# Patient Record
Sex: Female | Born: 1945
Health system: Southern US, Community
[De-identification: ages and names within clinical notes are randomized; demographics above are authoritative.]

## PROBLEM LIST (undated history)

## (undated) DIAGNOSIS — E559 Vitamin D deficiency, unspecified: Secondary | ICD-10-CM

## (undated) DIAGNOSIS — Z8042 Family history of malignant neoplasm of prostate: Secondary | ICD-10-CM

## (undated) DIAGNOSIS — K5790 Diverticulosis of intestine, part unspecified, without perforation or abscess without bleeding: Secondary | ICD-10-CM

## (undated) DIAGNOSIS — R937 Abnormal findings on diagnostic imaging of other parts of musculoskeletal system: Secondary | ICD-10-CM

## (undated) DIAGNOSIS — E119 Type 2 diabetes mellitus without complications: Secondary | ICD-10-CM

## (undated) DIAGNOSIS — Z923 Personal history of irradiation: Secondary | ICD-10-CM

## (undated) DIAGNOSIS — R011 Cardiac murmur, unspecified: Secondary | ICD-10-CM

## (undated) DIAGNOSIS — Z91199 Patient's noncompliance with other medical treatment and regimen due to unspecified reason: Secondary | ICD-10-CM

## (undated) DIAGNOSIS — T4145XA Adverse effect of unspecified anesthetic, initial encounter: Secondary | ICD-10-CM

## (undated) DIAGNOSIS — M1990 Unspecified osteoarthritis, unspecified site: Secondary | ICD-10-CM

## (undated) DIAGNOSIS — Z800 Family history of malignant neoplasm of digestive organs: Secondary | ICD-10-CM

## (undated) DIAGNOSIS — T8859XA Other complications of anesthesia, initial encounter: Secondary | ICD-10-CM

## (undated) DIAGNOSIS — I5030 Unspecified diastolic (congestive) heart failure: Secondary | ICD-10-CM

## (undated) DIAGNOSIS — E785 Hyperlipidemia, unspecified: Secondary | ICD-10-CM

## (undated) DIAGNOSIS — K219 Gastro-esophageal reflux disease without esophagitis: Secondary | ICD-10-CM

## (undated) DIAGNOSIS — C801 Malignant (primary) neoplasm, unspecified: Secondary | ICD-10-CM

## (undated) DIAGNOSIS — Z8041 Family history of malignant neoplasm of ovary: Secondary | ICD-10-CM

## (undated) DIAGNOSIS — M810 Age-related osteoporosis without current pathological fracture: Secondary | ICD-10-CM

## (undated) DIAGNOSIS — Z9119 Patient's noncompliance with other medical treatment and regimen: Secondary | ICD-10-CM

## (undated) DIAGNOSIS — I10 Essential (primary) hypertension: Secondary | ICD-10-CM

## (undated) HISTORY — DX: Patient's noncompliance with other medical treatment and regimen: Z91.19

## (undated) HISTORY — DX: Vitamin D deficiency, unspecified: E55.9

## (undated) HISTORY — DX: Essential (primary) hypertension: I10

## (undated) HISTORY — DX: Family history of malignant neoplasm of digestive organs: Z80.0

## (undated) HISTORY — DX: Diverticulosis of intestine, part unspecified, without perforation or abscess without bleeding: K57.90

## (undated) HISTORY — DX: Type 2 diabetes mellitus without complications: E11.9

## (undated) HISTORY — DX: Family history of malignant neoplasm of prostate: Z80.42

## (undated) HISTORY — PX: FOREIGN BODY REMOVAL ESOPHAGEAL: SHX5322

## (undated) HISTORY — DX: Abnormal findings on diagnostic imaging of other parts of musculoskeletal system: R93.7

## (undated) HISTORY — PX: DILATION AND CURETTAGE OF UTERUS: SHX78

## (undated) HISTORY — PX: APPENDECTOMY: SHX54

## (undated) HISTORY — DX: Patient's noncompliance with other medical treatment and regimen due to unspecified reason: Z91.199

## (undated) HISTORY — DX: Hyperlipidemia, unspecified: E78.5

## (undated) HISTORY — DX: Family history of malignant neoplasm of ovary: Z80.41

## (undated) HISTORY — DX: Gastro-esophageal reflux disease without esophagitis: K21.9

## (undated) HISTORY — PX: TUBAL LIGATION: SHX77

## (undated) HISTORY — DX: Malignant (primary) neoplasm, unspecified: C80.1

## (undated) HISTORY — PX: TONSILLECTOMY: SUR1361

## (undated) HISTORY — DX: Age-related osteoporosis without current pathological fracture: M81.0

---

## 2004-03-01 ENCOUNTER — Emergency Department (HOSPITAL_COMMUNITY): Admission: EM | Admit: 2004-03-01 | Discharge: 2004-03-01 | Payer: Self-pay | Admitting: Emergency Medicine

## 2005-06-12 HISTORY — PX: COLONOSCOPY: SHX174

## 2005-06-12 LAB — HM COLONOSCOPY

## 2006-10-15 ENCOUNTER — Ambulatory Visit: Payer: Self-pay | Admitting: Family Medicine

## 2006-10-16 LAB — CONVERTED CEMR LAB
ALT: 28 units/L (ref 0–35)
AST: 22 units/L (ref 0–37)
Albumin: 4.1 g/dL (ref 3.5–5.2)
Alkaline Phosphatase: 79 units/L (ref 39–117)
BUN: 13 mg/dL (ref 6–23)
Basophils Absolute: 0 10*3/uL (ref 0.0–0.1)
Basophils Relative: 0 % (ref 0.0–1.0)
Bilirubin, Direct: 0.1 mg/dL (ref 0.0–0.3)
CO2: 30 meq/L (ref 19–32)
Calcium: 9.6 mg/dL (ref 8.4–10.5)
Chloride: 107 meq/L (ref 96–112)
Cholesterol: 251 mg/dL (ref 0–200)
Creatinine, Ser: 0.8 mg/dL (ref 0.4–1.2)
Direct LDL: 172.1 mg/dL
Eosinophils Absolute: 0.4 10*3/uL (ref 0.0–0.6)
Eosinophils Relative: 3.4 % (ref 0.0–5.0)
GFR calc Af Amer: 94 mL/min
GFR calc non Af Amer: 78 mL/min
Glucose, Bld: 94 mg/dL (ref 70–99)
HCT: 41.1 % (ref 36.0–46.0)
HDL: 40.3 mg/dL (ref >39.0)
Hemoglobin: 14.1 g/dL (ref 12.0–15.0)
Lymphocytes Relative: 20.2 % (ref 12.0–46.0)
MCHC: 34.2 g/dL (ref 30.0–36.0)
MCV: 86.3 fL (ref 78.0–100.0)
Monocytes Absolute: 0.7 10*3/uL (ref 0.2–0.7)
Monocytes Relative: 6.5 % (ref 3.0–11.0)
Neutro Abs: 7.6 10*3/uL (ref 1.4–7.7)
Neutrophils Relative %: 69.9 % (ref 43.0–77.0)
Platelets: 337 10*3/uL (ref 150–400)
Potassium: 4.3 meq/L (ref 3.5–5.1)
RBC: 4.77 M/uL (ref 3.87–5.11)
RDW: 13.6 % (ref 11.5–14.6)
Sodium: 142 meq/L (ref 135–145)
TSH: 1.6 microintl units/mL (ref 0.35–5.50)
Total Bilirubin: 1 mg/dL (ref 0.3–1.2)
Total CHOL/HDL Ratio: 6.2
Total Protein: 7.1 g/dL (ref 6.0–8.3)
Triglycerides: 210 mg/dL (ref 0–149)
VLDL: 42 mg/dL — ABNORMAL HIGH (ref 0–40)
WBC: 10.9 10*3/uL — ABNORMAL HIGH (ref 4.5–10.5)

## 2006-10-26 ENCOUNTER — Encounter: Payer: Self-pay | Admitting: Family Medicine

## 2006-10-26 DIAGNOSIS — K5730 Diverticulosis of large intestine without perforation or abscess without bleeding: Secondary | ICD-10-CM

## 2006-10-26 DIAGNOSIS — R011 Cardiac murmur, unspecified: Secondary | ICD-10-CM

## 2006-10-26 DIAGNOSIS — E785 Hyperlipidemia, unspecified: Secondary | ICD-10-CM | POA: Insufficient documentation

## 2006-10-26 DIAGNOSIS — Z9189 Other specified personal risk factors, not elsewhere classified: Secondary | ICD-10-CM | POA: Insufficient documentation

## 2006-10-26 DIAGNOSIS — I10 Essential (primary) hypertension: Secondary | ICD-10-CM

## 2006-10-26 DIAGNOSIS — E1159 Type 2 diabetes mellitus with other circulatory complications: Secondary | ICD-10-CM | POA: Insufficient documentation

## 2006-11-20 ENCOUNTER — Encounter: Payer: Self-pay | Admitting: Family Medicine

## 2007-11-13 ENCOUNTER — Ambulatory Visit: Payer: Self-pay | Admitting: Internal Medicine

## 2007-11-13 ENCOUNTER — Inpatient Hospital Stay (HOSPITAL_COMMUNITY): Admission: EM | Admit: 2007-11-13 | Discharge: 2007-11-14 | Payer: Self-pay | Admitting: Emergency Medicine

## 2007-11-19 ENCOUNTER — Encounter: Payer: Self-pay | Admitting: Family Medicine

## 2007-11-19 ENCOUNTER — Ambulatory Visit: Payer: Self-pay

## 2007-11-20 ENCOUNTER — Ambulatory Visit: Payer: Self-pay | Admitting: Family Medicine

## 2007-11-20 DIAGNOSIS — IMO0001 Reserved for inherently not codable concepts without codable children: Secondary | ICD-10-CM | POA: Insufficient documentation

## 2007-11-20 DIAGNOSIS — R0789 Other chest pain: Secondary | ICD-10-CM | POA: Insufficient documentation

## 2007-11-20 HISTORY — DX: Other chest pain: R07.89

## 2007-11-21 ENCOUNTER — Telehealth (INDEPENDENT_AMBULATORY_CARE_PROVIDER_SITE_OTHER): Payer: Self-pay | Admitting: *Deleted

## 2007-11-22 ENCOUNTER — Encounter: Admission: RE | Admit: 2007-11-22 | Discharge: 2007-11-22 | Payer: Self-pay | Admitting: Family Medicine

## 2007-11-28 ENCOUNTER — Encounter: Payer: Self-pay | Admitting: Family Medicine

## 2007-11-28 ENCOUNTER — Ambulatory Visit: Payer: Self-pay

## 2008-08-04 ENCOUNTER — Telehealth: Payer: Self-pay | Admitting: Family Medicine

## 2008-08-11 ENCOUNTER — Ambulatory Visit: Payer: Self-pay | Admitting: Family Medicine

## 2008-08-11 DIAGNOSIS — K219 Gastro-esophageal reflux disease without esophagitis: Secondary | ICD-10-CM

## 2008-08-11 HISTORY — DX: Gastro-esophageal reflux disease without esophagitis: K21.9

## 2008-08-13 LAB — CONVERTED CEMR LAB
ALT: 40 units/L — ABNORMAL HIGH (ref 0–35)
AST: 43 units/L — ABNORMAL HIGH (ref 0–37)
Albumin: 4.2 g/dL (ref 3.5–5.2)
Alkaline Phosphatase: 111 units/L (ref 39–117)
BUN: 17 mg/dL (ref 6–23)
Basophils Absolute: 0.1 10*3/uL (ref 0.0–0.1)
Basophils Relative: 0.6 % (ref 0.0–3.0)
Bilirubin, Direct: 0.1 mg/dL (ref 0.0–0.3)
CO2: 26 meq/L (ref 19–32)
Calcium: 9.2 mg/dL (ref 8.4–10.5)
Chloride: 108 meq/L (ref 96–112)
Cholesterol: 143 mg/dL (ref 0–200)
Creatinine, Ser: 0.7 mg/dL (ref 0.4–1.2)
Eosinophils Absolute: 0.4 10*3/uL (ref 0.0–0.7)
Eosinophils Relative: 3.5 % (ref 0.0–5.0)
GFR calc non Af Amer: 90.47 mL/min (ref >60)
Glucose, Bld: 159 mg/dL — ABNORMAL HIGH (ref 70–99)
HCT: 43.3 % (ref 36.0–46.0)
HDL: 35.9 mg/dL — ABNORMAL LOW (ref >39.00)
Hemoglobin: 14.8 g/dL (ref 12.0–15.0)
LDL Cholesterol: 75 mg/dL (ref 0–99)
Lymphocytes Relative: 21.4 % (ref 12.0–46.0)
Lymphs Abs: 2.2 10*3/uL (ref 0.7–4.0)
MCHC: 34.1 g/dL (ref 30.0–36.0)
MCV: 85.2 fL (ref 78.0–100.0)
Monocytes Absolute: 0.8 10*3/uL (ref 0.1–1.0)
Monocytes Relative: 7.5 % (ref 3.0–12.0)
Neutro Abs: 6.8 10*3/uL (ref 1.4–7.7)
Neutrophils Relative %: 67 % (ref 43.0–77.0)
Platelets: 267 10*3/uL (ref 150.0–400.0)
Potassium: 4.1 meq/L (ref 3.5–5.1)
RBC: 5.08 M/uL (ref 3.87–5.11)
RDW: 13.6 % (ref 11.5–14.6)
Sodium: 141 meq/L (ref 135–145)
TSH: 1.14 microintl units/mL (ref 0.35–5.50)
Total Bilirubin: 1 mg/dL (ref 0.3–1.2)
Total CHOL/HDL Ratio: 4
Total Protein: 7.4 g/dL (ref 6.0–8.3)
Triglycerides: 163 mg/dL — ABNORMAL HIGH (ref 0.0–149.0)
VLDL: 32.6 mg/dL (ref 0.0–40.0)
Vit D, 25-Hydroxy: 23 ng/mL — ABNORMAL LOW (ref 30–89)
WBC: 10.3 10*3/uL (ref 4.5–10.5)

## 2008-08-21 LAB — CONVERTED CEMR LAB: Hgb A1c MFr Bld: 10.6 % — ABNORMAL HIGH (ref 4.6–6.5)

## 2008-08-24 ENCOUNTER — Telehealth: Payer: Self-pay | Admitting: *Deleted

## 2008-10-06 ENCOUNTER — Ambulatory Visit: Payer: Self-pay | Admitting: Family Medicine

## 2008-10-06 DIAGNOSIS — E119 Type 2 diabetes mellitus without complications: Secondary | ICD-10-CM

## 2008-10-06 DIAGNOSIS — E1165 Type 2 diabetes mellitus with hyperglycemia: Secondary | ICD-10-CM | POA: Insufficient documentation

## 2008-10-13 ENCOUNTER — Encounter: Admission: RE | Admit: 2008-10-13 | Discharge: 2009-01-11 | Payer: Self-pay | Admitting: Family Medicine

## 2008-10-15 ENCOUNTER — Encounter: Payer: Self-pay | Admitting: Family Medicine

## 2008-10-15 LAB — CONVERTED CEMR LAB: Hgb A1c MFr Bld: 8 % — ABNORMAL HIGH (ref 4.6–6.5)

## 2008-11-04 ENCOUNTER — Encounter: Payer: Self-pay | Admitting: Family Medicine

## 2008-11-18 ENCOUNTER — Ambulatory Visit: Payer: Self-pay | Admitting: Family Medicine

## 2008-11-18 DIAGNOSIS — F329 Major depressive disorder, single episode, unspecified: Secondary | ICD-10-CM

## 2008-11-18 DIAGNOSIS — K5732 Diverticulitis of large intestine without perforation or abscess without bleeding: Secondary | ICD-10-CM

## 2008-12-08 ENCOUNTER — Ambulatory Visit: Payer: Self-pay | Admitting: Family Medicine

## 2009-01-23 ENCOUNTER — Encounter: Payer: Self-pay | Admitting: Family Medicine

## 2009-02-19 ENCOUNTER — Telehealth: Payer: Self-pay | Admitting: Family Medicine

## 2010-05-22 ENCOUNTER — Other Ambulatory Visit: Payer: Self-pay | Admitting: Family Medicine

## 2010-05-23 NOTE — Telephone Encounter (Signed)
meds denied and request faxed

## 2010-05-23 NOTE — Telephone Encounter (Signed)
Looks like ov last 2010

## 2010-05-23 NOTE — Telephone Encounter (Signed)
Last ov 2010

## 2010-05-23 NOTE — Telephone Encounter (Signed)
No refills until we see her again

## 2010-06-02 ENCOUNTER — Other Ambulatory Visit: Payer: Self-pay | Admitting: Family Medicine

## 2010-06-02 NOTE — Telephone Encounter (Signed)
Dr Clent Ridges looks like last ov 2010

## 2010-06-07 ENCOUNTER — Other Ambulatory Visit: Payer: Self-pay | Admitting: *Deleted

## 2010-06-28 ENCOUNTER — Other Ambulatory Visit: Payer: Self-pay | Admitting: Family Medicine

## 2010-08-23 NOTE — H&P (Signed)
NAMEANGELISA, [name]                ACCOUNT NO.:  [id number]   MEDICAL RECORD NO.:  [id number]          PATIENT TYPE:  INP   LOCATION:  2004                         FACILITY:  [facility]   PHYSICIAN:  Michiel Cowboy, MDDATE OF BIRTH:  Jul 20, 1945   DATE OF ADMISSION:  11/14/2007  DATE OF DISCHARGE:                              HISTORY & PHYSICAL   PRIMARY CARE [place]:  Dr. Clent Ridges.   The patient is a 65 year old female with a history of hyperlipidemia and  hypertension.  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  She got clammy and  nauseous.  This lasted for about 7 hours, but by the end was coming and  going rather than continuous.  The patient did not come to the emergency  department or seek any medical help for this and it went away.  But  today, it reoccurred again and lasted for about 4 hours prior to going  away spontaneously.  The patient did present to the emergency department  for further evaluation for this particular symptom.  She is not sure  what brought this on.  During this, she had mild shortness of breath,  but the chest pain was nonpositional, nonradiating and currently  resolved.  The patient never had any cardiac workup in the past.  She  does not smoke or drink.  She does not have an early family history of  heart disease.   SOCIAL HISTORY:  The patient denies alcohol or drug use.  Does not  smoke, never did.   FAMILY HISTORY:  Significant for grandmother with heart disease in her  33s and mother with cardiomyopathy, but no heart attack, no coronary  artery disease.   REVIEW OF SYSTEMS:  Otherwise unremarkable, except for as described in  HPI.  No fevers, no chills.   PAST MEDICAL HISTORY:  1. Hypertension.  2. Hyperlipidemia.  3. She was also  told she was borderline diabetic.  4. She had a colonoscopy in March 2007, which showed some      diverticulosis.   ALLERGIES:  TETRACYCLINE.   MEDICATIONS:  1. Crestor 20 mg p.o. daily.  2. Aspirin 81 mg p.o. daily.  3. Micardis 80 mg p.o. daily.  4. Fish oil tablets.   PHYSICAL EXAMINATION:  VITAL SIGNS:  Temperature 97.9, heart rate 71,  respirations 18, blood pressure 163/92.  Satting 96% on room air.  GENERAL:  The patient appears to be in no acute distress.  A obese  female lying down in the bed.  HEENT:  Head nontraumatic.  Moist mucous membranes.  LUNGS:  Occasional crackles here and there, but overall no wheezes, no  rhonchi.  HEART:  Regular rate and rhythm.  No murmurs could be appreciated.  LOWER EXTREMITIES:  Without edema, but obese.  ABDOMEN:  Obese but nontender, nondistended.  NEUROLOGIC:  Intact.  Moving all four extremities.  LABORATORY DATA:  White blood cell count 11, hemoglobin 14.3, platelets  266, sodium 138, potassium 3.7, creatinine 0.9.  LFTs within normal  limits.  Cardiac enzymes negative x2.  Chest x-ray is showing negative  for PE, but there is possibly reactive mediastinal node.  No evidence of  pneumonia.  EKG showing normal sinus rhythm.   ASSESSMENT/PLAN:  This is a 65 year old female with history of  hypertension, hyperlipidemia and possible early diabetes who presents  with somewhat atypical chest pain, but still has extensive risk factors.   1. Chest pain.  Given risk factors, will cycle cardiac enzymes x3.      Obtain serial ECGs.  Further risk-stratify with a fasting lipid      panel, hemoglobin A1c.  Will check TSH.  Will give Protonix in case      this is gastrointestinal related.  The patient probably will need      to have a stress test arranged.  Would recommend getting hold of      cardiology to have this arranged as an outpatient.  2. Hypertension.  Continue Micardis.  After initiation of her home      medications if the blood  pressure still stays highly elevated,      would add a different agent.  3. Hyperlipidemia.  Continue Crestor.  4. Elevated white blood cell count.  Check urinalysis plus urine      culture.  The patient could be hemoconcentrated, will check      orthostatics and give IV fluids.  5. Prophylaxis, Protonix with sequential compression devices.      Michiel Cowboy, MD  Electronically Signed     AVD/MEDQ  D:  11/14/2007  T:  11/14/2007  Job:  [id number]   cc:   Tera Mater. Clent Ridges, MD

## 2010-08-23 NOTE — Assessment & Plan Note (Signed)
[facility] OFFICE NOTE   NAME:[name], [name]                     MRN:          [id number]  DATE:10/15/2006                            DOB:          [birth year]    HISTORY:  This is a 65 year old woman here to establish with our  practice.  She had been seeing Dr. Catha Gosselin for primary care, but is  switching to [place] due to an insurance change.  In general, she feels fine  and has no complaints.  She has a history of hypertension which is well  managed.  She checks her pressure frequently at home.  She usually gets  systolic readings in the 130s and diastolic readings in the 70s.  She  has also been treated for high cholesterol over the past 2 years.  Her  total cholesterol has come down from 259 to 150.  It was last checked  about 6 months ago.  She is fasting today and is expecting to get some  more blood work.   PAST MEDICAL HISTORY:  She has been treated for hypertension and high  cholesterol for the past 2 years.  She was told several years ago she a  mild heart murmur, but not to worry about it.  She had chicken pox as a  child.  She had a colonoscopy in March of 2007, this was normal except  for some diverticulosis.  Immunizations, she had a tetanus booster and a  pneumonia booster in 2003.  She has had 2 vaginal deliveries.  She has  not had a Pap smear or mammogram since 2003, unfortunately.  She has had  an appendectomy and a tonsillectomy.   ALLERGIES:  TETRACYCLINE.   CURRENT MEDICATIONS:  1. Micardis 80 mg per day.  2. Crestor 10 mg per day.  3. Aspirin 81 mg per day.   HABITS:  She does not use alcohol or tobacco.   SOCIAL HISTORY:  She is married.  She works as an Print production planner.   FAMILY HISTORY:  Remarkable for colon cancer and ovarian cancer in her  mother.  Also, high cholesterol, heart disease, hypertension, and  diabetes.   OBJECTIVE:  Weight 249 pounds, blood pressure 148/88,  pulse 80 and  regular.  IN GENERAL:  She is obese.  NECK:  Supple without lymphadenopathy or thyromegaly.  LUNGS:  Clear.  CARDIAC:  Rate and rhythm regular without gallops or rubs.  She has a  2/6 systolic murmur, loudest in the tricuspid area.  Distal pulses are full.  EXTREMITIES:  Show no edema.   ASSESSMENT/PLAN:  1. Hypertension, apparently stable.  I refilled her current      medications.  2. Hyperlipidemia, will check a fasting lipid panel today, as well as      other complete labs.  Will also attempt to have her old records      sent to [place].     Tera Mater. Clent Ridges, MD  Electronically Signed    SAF/MedQ  DD: 10/16/2006  DT: 10/16/2006  Job #: [id number]

## 2010-08-23 NOTE — Discharge Summary (Signed)
[name], [name]                ACCOUNT NO.:  [id number]   MEDICAL RECORD NO.:  [id number]          PATIENT TYPE:  INP   LOCATION:  2004                         FACILITY:  [facility]   PHYSICIAN:  Rosalyn Gess. Norins, MD  DATE OF BIRTH:  03-May-1945   DATE OF ADMISSION:  11/13/2007  DATE OF DISCHARGE:  11/14/2007                               DISCHARGE SUMMARY   ADMISSION DIAGNOSIS:  Chest pain, rule out myocardial infarction.   DISCHARGE DIAGNOSIS:  Myocardial infarction, ruled out.   HISTORY OF PRESENT ILLNESS:  The patient is a 65 year old woman with a  history of hyperlipidemia and hypertension.  She was at her baseline  state of health until several days prior to admission.  When she was  doing some work and developed sharp pressure-like discomfort over her  substernal chest region with radiation to her back.  She reports she got  clammy and nauseous.  This lasted for about 7 hours and at the end was  coming and going rather than continuous.  The patient did not like  coming to the emergency department.  On the day of admission, this  recurred lasting for about 4 hours prior to going away spontaneously.  She presented to the emergency department for these symptoms.  She had  mild shortness of breath.  Her chest pain was nonpositional and  nonradiating and was resolved at time of admission.  The patient has not  had previous cardiac workup.  She does not have an early family history  for heart disease.  Her risk factors include hypertension and  hyperlipidemia.   Please see H&P for past medical history, family history, social history,  and exam.   HOSPITAL COURSE:  1. The patient was admitted to a telemetry floor.  She had cardiac      enzymes cycled with initial values in the emergency department      being negative with troponin of less than 0.05, CK-MB of less than      1.  First routine cardiac panel at 2140 hours on November 13, 2007      with a troponin of 0.03 and CK was 95.   Second cardiac panel at      0400 hours on November 14, 2007 with a CK of 105, CK-MB of 0.9, and      troponin less than 0.01.  Third study is pending.  The patient's      chest discomfort was essentially resolved but at the time of      discharge dictation she did complain of having a little bit of a      fluttery-type chest pain.  Her vital signs were stable.  With the      patient having had 3 sets of cardiac enzymes that were normal, the      EKG being normal, she was thought to be stable and ready for      discharge home for outpatient followup.  2. Hyperlipidemia.  The patient had a lipid profile in hospital which      revealed a cholesterol of 117, triglycerides 130, HDL was  30, and      LDL was 61.  Liver functions were normal.  Plan:  The patient will      continue on her Crestor.  3. Hypertension.  The patient was mildly hypertensive in hospital at      155/88.  At this point, we will send her home on metoprolol at 12.5      mg b.i.d.  4. GI.  Question of whether the patient was having any GI symptoms.      We will make sure she goes home on a proton pump inhibitor.   DISPOSITION:  The patient is discharged to home.  She is scheduled to  have a nuclear stress study at [facility] on Tuesday, November 19, 2007 at 9:45 a.m.  The patient is aware of this study.  I was able to  establish an appointment for her with Dr. Gershon Crane on November 20, 2007  for followup.   CONDITION ON DISCHARGE:  The patient's condition at time of discharge  dictation is stable and improved.      Rosalyn Gess Norins, MD  Electronically Signed     MEN/MEDQ  D:  11/14/2007  T:  11/14/2007  Job:  [id number]   cc:   Jeannett Senior A. Clent Ridges, MD  [facility]

## 2011-01-06 LAB — CBC
HCT: 41.2
Hemoglobin: 12.3
Hemoglobin: 13.8
MCHC: 33.2
MCHC: 33.6
MCV: 86.8
MCV: 87.6
Platelets: 266
RBC: 4.22
RBC: 4.74
RDW: 14.2
RDW: 14.2
WBC: 11 — ABNORMAL HIGH

## 2011-01-06 LAB — CK TOTAL AND CKMB (NOT AT ARMC)
CK, MB: 0.9
CK, MB: 1
Relative Index: 0.9
Relative Index: INVALID
Total CK: 105
Total CK: 95

## 2011-01-06 LAB — POCT I-STAT, CHEM 8
BUN: 16
Calcium, Ion: 1.03 — ABNORMAL LOW
Chloride: 106
Creatinine, Ser: 0.9
Glucose, Bld: 142 — ABNORMAL HIGH
HCT: 42
Hemoglobin: 14.3
Potassium: 3.7
Sodium: 138
TCO2: 23

## 2011-01-06 LAB — HEPATIC FUNCTION PANEL
ALT: 26
AST: 26
Albumin: 3.8
Alkaline Phosphatase: 76
Bilirubin, Direct: 0.2
Indirect Bilirubin: 0.4
Total Bilirubin: 0.6
Total Protein: 6.6

## 2011-01-06 LAB — URINE CULTURE
Colony Count: NO GROWTH
Culture: NO GROWTH
Special Requests: NEGATIVE

## 2011-01-06 LAB — URINE MICROSCOPIC-ADD ON

## 2011-01-06 LAB — POCT CARDIAC MARKERS
CKMB, poc: <1 — ABNORMAL LOW
Myoglobin, poc: 93
Troponin i, poc: <0.05

## 2011-01-06 LAB — LIPID PANEL
Cholesterol: 117
HDL: 30 — ABNORMAL LOW
LDL Cholesterol: 61
Total CHOL/HDL Ratio: 3.9
Triglycerides: 130
VLDL: 26

## 2011-01-06 LAB — BASIC METABOLIC PANEL
BUN: 13
CO2: 25
Calcium: 8.6
Chloride: 106
Creatinine, Ser: 0.68
GFR calc Af Amer: >60
GFR calc non Af Amer: >60
Glucose, Bld: 127 — ABNORMAL HIGH
Potassium: 3.5
Sodium: 140

## 2011-01-06 LAB — URINALYSIS, ROUTINE W REFLEX MICROSCOPIC
Bilirubin Urine: NEGATIVE
Glucose, UA: NEGATIVE
Hgb urine dipstick: NEGATIVE
Ketones, ur: 15 — AB
Nitrite: NEGATIVE
Protein, ur: NEGATIVE
Specific Gravity, Urine: >1.046 — ABNORMAL HIGH
Urobilinogen, UA: 1
pH: 5.5

## 2011-01-06 LAB — TROPONIN I
Troponin I: 0.03
Troponin I: <0.01

## 2011-01-06 LAB — HEMOGLOBIN A1C
Hgb A1c MFr Bld: 7.8 — ABNORMAL HIGH
Mean Plasma Glucose: 200
Mean Plasma Glucose: 200

## 2011-01-06 LAB — TSH: TSH: 2.769

## 2011-01-06 LAB — LIPASE, BLOOD: Lipase: 14

## 2014-02-17 ENCOUNTER — Telehealth: Payer: Self-pay | Admitting: Family Medicine

## 2014-02-17 NOTE — Telephone Encounter (Signed)
Pt is aware of message

## 2014-02-17 NOTE — Telephone Encounter (Signed)
Sorry but I am too full  

## 2014-02-17 NOTE — Telephone Encounter (Signed)
Pt was last seen 2008 and would like to re-est with dr fry. Can I sch?

## 2014-02-19 ENCOUNTER — Ambulatory Visit: Payer: Self-pay | Admitting: Family Medicine

## 2015-02-22 DIAGNOSIS — H5213 Myopia, bilateral: Secondary | ICD-10-CM | POA: Diagnosis not present

## 2015-02-22 DIAGNOSIS — H5210 Myopia, unspecified eye: Secondary | ICD-10-CM | POA: Diagnosis not present

## 2016-02-11 ENCOUNTER — Ambulatory Visit (INDEPENDENT_AMBULATORY_CARE_PROVIDER_SITE_OTHER): Payer: Commercial Managed Care - HMO | Admitting: Family Medicine

## 2016-02-11 ENCOUNTER — Encounter: Payer: Self-pay | Admitting: Family Medicine

## 2016-02-11 VITALS — BP 144/94 | HR 65 | Temp 98.0°F | Wt 195.6 lb

## 2016-02-11 DIAGNOSIS — Z136 Encounter for screening for cardiovascular disorders: Secondary | ICD-10-CM | POA: Diagnosis not present

## 2016-02-11 DIAGNOSIS — Z8639 Personal history of other endocrine, nutritional and metabolic disease: Secondary | ICD-10-CM | POA: Diagnosis not present

## 2016-02-11 DIAGNOSIS — E1165 Type 2 diabetes mellitus with hyperglycemia: Secondary | ICD-10-CM

## 2016-02-11 DIAGNOSIS — E669 Obesity, unspecified: Secondary | ICD-10-CM | POA: Diagnosis not present

## 2016-02-11 DIAGNOSIS — K5730 Diverticulosis of large intestine without perforation or abscess without bleeding: Secondary | ICD-10-CM

## 2016-02-11 DIAGNOSIS — I10 Essential (primary) hypertension: Secondary | ICD-10-CM | POA: Diagnosis not present

## 2016-02-11 DIAGNOSIS — Z7689 Persons encountering health services in other specified circumstances: Secondary | ICD-10-CM | POA: Diagnosis not present

## 2016-02-11 DIAGNOSIS — IMO0001 Reserved for inherently not codable concepts without codable children: Secondary | ICD-10-CM

## 2016-02-11 LAB — POCT URINALYSIS DIPSTICK
Bilirubin, UA: NEGATIVE
Ketones, UA: NEGATIVE
NITRITE UA: NEGATIVE
RBC UA: NEGATIVE
Spec Grav, UA: >=1.03
UROBILINOGEN UA: NEGATIVE
pH, UA: 6

## 2016-02-11 LAB — CBC WITH DIFFERENTIAL/PLATELET
BASOS ABS: 104 {cells}/uL (ref 0–200)
Basophils Relative: 1 %
EOS ABS: 312 {cells}/uL (ref 15–500)
Eosinophils Relative: 3 %
HCT: 44.9 % (ref 35.0–45.0)
HEMOGLOBIN: 15.2 g/dL (ref 11.7–15.5)
Lymphocytes Relative: 26 %
Lymphs Abs: 2704 cells/uL (ref 850–3900)
MCH: 29.3 pg (ref 27.0–33.0)
MCHC: 33.9 g/dL (ref 32.0–36.0)
MCV: 86.7 fL (ref 80.0–100.0)
MONOS PCT: 9 %
MPV: 11.5 fL (ref 7.5–12.5)
Monocytes Absolute: 936 cells/uL (ref 200–950)
NEUTROS ABS: 6344 {cells}/uL (ref 1500–7800)
NEUTROS PCT: 61 %
PLATELETS: 318 10*3/uL (ref 140–400)
RBC: 5.18 MIL/uL — ABNORMAL HIGH (ref 3.80–5.10)
RDW: 13.5 % (ref 11.0–15.0)
WBC: 10.4 10*3/uL (ref 4.0–10.5)

## 2016-02-11 LAB — TSH: TSH: 0.95 mIU/L

## 2016-02-11 LAB — POCT GLYCOSYLATED HEMOGLOBIN (HGB A1C): Hemoglobin A1C: 12.4

## 2016-02-11 MED ORDER — METOPROLOL TARTRATE 25 MG PO TABS: 25.0000 mg | ORAL_TABLET | Freq: Every day | ORAL | 1 refills | Status: DC

## 2016-02-11 MED ORDER — EMPAGLIFLOZIN 10 MG PO TABS: 10.0000 mg | ORAL_TABLET | Freq: Every day | ORAL | 1 refills | Status: DC

## 2016-02-11 MED ORDER — METFORMIN HCL 500 MG PO TABS: 500.0000 mg | ORAL_TABLET | Freq: Two times a day (BID) | ORAL | 1 refills | Status: DC

## 2016-02-11 MED ORDER — GLUCOSE BLOOD VI STRP: ORAL_STRIP | 5 refills | Status: DC

## 2016-02-11 MED ORDER — ACCU-CHEK SOFTCLIX LANCET DEV MISC: 0 refills | Status: DC

## 2016-02-11 NOTE — Patient Instructions (Signed)
Start checking your blood sugars every morning before you eat or drink anything. Then, check your blood sugar 2 hours after eating either lunch or supper. Right all of these down in the log book and bring them back in 2 weeks.  Take metformin 500 mg twice daily with food. Start Jardiance 10 mg each morning with breakfast.  Check your blood pressures after sitting relaxed for at least 5 minutes. Keep a reading of these as well.  I would like to have your return in 2 weeks for follow-up on blood sugars and blood pressure.  We will call you with lab results.  Blood Glucose Monitoring, Adult Monitoring your blood glucose (also know as blood sugar) helps you to manage your diabetes. It also helps you and your health care provider monitor your diabetes and determine how well your treatment plan is working. WHY SHOULD YOU MONITOR YOUR BLOOD GLUCOSE?  It can help you understand how food, exercise, and medicine affect your blood glucose.  It allows you to know what your blood glucose is at any given moment. You can quickly tell if you are having low blood glucose (hypoglycemia) or high blood glucose (hyperglycemia).  It can help you and your health care provider know how to adjust your medicines.  It can help you understand how to manage an illness or adjust medicine for exercise. WHEN SHOULD YOU TEST? Your health care provider will help you decide how often you should check your blood glucose. This may depend on the type of diabetes you have, your diabetes control, or the types of medicines you are taking. Be sure to write down all of your blood glucose readings so that this information can be reviewed with your health care provider. See below for examples of testing times that your health care provider may suggest. Type 1 Diabetes  Test at least 2 times per day if your diabetes is well controlled, if you are using an insulin pump, or if you perform multiple daily injections.  If your diabetes is  not well controlled or if you are sick, you may need to test more often.  It is a good idea to also test:  Before every insulin injection.  Before and after exercise.  Between meals and 2 hours after a meal.  Occasionally between 2:00 a.m. and 3:00 a.m. Type 2 Diabetes  If you are taking insulin, test at least 2 times per day. However, it is best to test before every insulin injection.  If you take medicines by mouth (orally), test 2 times a day.  If you are on a controlled diet, test once a day.  If your diabetes is not well controlled or if you are sick, you may need to monitor more often. HOW TO MONITOR YOUR BLOOD GLUCOSE Supplies Needed  Blood glucose meter.  Test strips for your meter. Each meter has its own strips. You must use the strips that go with your own meter.  A pricking needle (lancet).  A device that holds the lancet (lancing device).  A journal or log book to write down your results. Procedure  Wash your hands with soap and water. Alcohol is not preferred.  Prick the side of your finger (not the tip) with the lancet.  Gently milk the finger until a small drop of blood appears.  Follow the instructions that come with your meter for inserting the test strip, applying blood to the strip, and using your blood glucose meter. Other Areas to Get Blood for Testing  Some meters allow you to use other areas of your body (other than your finger) to test your blood. These areas are called alternative sites. The most common alternative sites are:  The forearm.  The thigh.  The back area of the lower leg.  The palm of the hand. The blood flow in these areas is slower. Therefore, the blood glucose values you get may be delayed, and the numbers are different from what you would get from your fingers. Do not use alternative sites if you think you are having hypoglycemia. Your reading will not be accurate. Always use a finger if you are having hypoglycemia. Also, if  you cannot feel your lows (hypoglycemia unawareness), always use your fingers for your blood glucose checks. ADDITIONAL TIPS FOR GLUCOSE MONITORING  Do not reuse lancets.  Always carry your supplies with you.  All blood glucose meters have a 24-hour "hotline" number to call if you have questions or need help.  Adjust (calibrate) your blood glucose meter with a control solution after finishing a few boxes of strips. BLOOD GLUCOSE RECORD KEEPING It is a good idea to keep a daily record or log of your blood glucose readings. Most glucose meters, if not all, keep your glucose records stored in the meter. Some meters come with the ability to download your records to your home computer. Keeping a record of your blood glucose readings is especially helpful if you are wanting to look for patterns. Make notes to go along with the blood glucose readings because you might forget what happened at that exact time. Keeping good records helps you and your health care provider to work together to achieve good diabetes management.    This information is not intended to replace advice given to you by your health care provider. Make sure you discuss any questions you have with your health care provider.   Document Released: 03/30/2003 Document Revised: 04/17/2014 Document Reviewed: 08/19/2012 Elsevier Interactive Patient Education 2016 [place] Carbohydrate Counting for Diabetes Mellitus Carbohydrate counting is a method for keeping track of the amount of carbohydrates you eat. Eating carbohydrates naturally increases the level of sugar (glucose) in your blood, so it is important for you to know the amount that is okay for you to have in every meal. Carbohydrate counting helps keep the level of glucose in your blood within normal limits. The amount of carbohydrates allowed is different for every person. A dietitian can help you calculate the amount that is right for you. Once you know the amount of  carbohydrates you can have, you can count the carbohydrates in the foods you want to eat. Carbohydrates are found in the following foods:  Grains, such as breads and cereals.  Dried beans and soy products.  Starchy vegetables, such as potatoes, peas, and corn.  Fruit and fruit juices.  Milk and yogurt.  Sweets and snack foods, such as cake, cookies, candy, chips, soft drinks, and fruit drinks. CARBOHYDRATE COUNTING There are two ways to count the carbohydrates in your food. You can use either of the methods or a combination of both. Reading the "Nutrition Facts" on [place] The "Nutrition Facts" is an area that is included on the labels of almost all packaged food and beverages in the [place]. It includes the serving size of that food or beverage and information about the nutrients in each serving of the food, including the grams (g) of carbohydrate per serving.  Decide the number of servings of this food  or beverage that you will be able to eat or drink. Multiply that number of servings by the number of grams of carbohydrate that is listed on the label for that serving. The total will be the amount of carbohydrates you will be having when you eat or drink this food or beverage. Learning Standard Serving Sizes of Food When you eat food that is not packaged or does not include "Nutrition Facts" on the label, you need to measure the servings in order to count the amount of carbohydrates.A serving of most carbohydrate-rich foods contains about 15 g of carbohydrates. The following list includes serving sizes of carbohydrate-rich foods that provide 15 g ofcarbohydrate per serving:   1 slice of bread (1 oz) or 1 six-inch tortilla.    of a hamburger bun or English muffin.  4-6 crackers.   cup unsweetened dry cereal.    cup hot cereal.   cup rice or pasta.    cup mashed potatoes or  of a large baked potato.  1 cup fresh fruit or one small piece of fruit.    cup  canned or frozen fruit or fruit juice.  1 cup milk.   cup plain fat-free yogurt or yogurt sweetened with artificial sweeteners.   cup cooked dried beans or starchy vegetable, such as peas, corn, or potatoes.  Decide the number of standard-size servings that you will eat. Multiply that number of servings by 15 (the grams of carbohydrates in that serving). For example, if you eat 2 cups of strawberries, you will have eaten 2 servings and 30 g of carbohydrates (2 servings x 15 g = 30 g). For foods such as soups and casseroles, in which more than one food is mixed in, you will need to count the carbohydrates in each food that is included. EXAMPLE OF CARBOHYDRATE COUNTING Sample Dinner  3 oz chicken breast.   cup of brown rice.   cup of corn.  1 cup milk.   1 cup strawberries with sugar-free whipped topping.  Carbohydrate Calculation Step 1: Identify the foods that contain carbohydrates:   Rice.   Corn.   Milk.   Strawberries. Step 2:Calculate the number of servings eaten of each:   2 servings of rice.   1 serving of corn.   1 serving of milk.   1 serving of strawberries. Step 3: Multiply each of those number of servings by 15 g:   2 servings of rice x 15 g = 30 g.   1 serving of corn x 15 g = 15 g.   1 serving of milk x 15 g = 15 g.   1 serving of strawberries x 15 g = 15 g. Step 4: Add together all of the amounts to find the total grams of carbohydrates eaten: 30 g + 15 g + 15 g + 15 g = 75 g.   This information is not intended to replace advice given to you by your health care provider. Make sure you discuss any questions you have with your health care provider.   Document Released: 03/27/2005 Document Revised: 04/17/2014 Document Reviewed: 02/21/2013 Elsevier Interactive Patient Education 2016 [place] DASH stands for "Dietary Approaches to Stop Hypertension." The DASH eating plan is a healthy eating plan that has been  shown to reduce high blood pressure (hypertension). Additional health benefits may include reducing the risk of type 2 diabetes mellitus, heart disease, and stroke. The DASH eating plan may also help with weight loss. WHAT  DO I NEED TO KNOW ABOUT THE DASH EATING PLAN? For the DASH eating plan, you will follow these general guidelines:  Choose foods with a percent daily value for sodium of less than 5% (as listed on the food label).  Use salt-free seasonings or herbs instead of table salt or sea salt.  Check with your health care provider or pharmacist before using salt substitutes.  Eat lower-sodium products, often labeled as "lower sodium" or "no salt added."  Eat fresh foods.  Eat more vegetables, fruits, and low-fat dairy products.  Choose whole grains. Look for the word "whole" as the first word in the ingredient list.  Choose fish and skinless chicken or [place] more often than red meat. Limit fish, poultry, and meat to 6 oz (170 g) each day.  Limit sweets, desserts, sugars, and sugary drinks.  Choose heart-healthy fats.  Limit cheese to 1 oz (28 g) per day.  Eat more home-cooked food and less restaurant, buffet, and fast food.  Limit fried foods.  Cook foods using methods other than frying.  Limit canned vegetables. If you do use them, rinse them well to decrease the sodium.  When eating at a restaurant, ask that your food be prepared with less salt, or no salt if possible. WHAT FOODS CAN I EAT? Seek help from a dietitian for individual calorie needs. Grains Whole grain or whole wheat bread. Brown rice. Whole grain or whole wheat pasta. Quinoa, bulgur, and whole grain cereals. Low-sodium cereals. Corn or whole wheat flour tortillas. Whole grain cornbread. Whole grain crackers. Low-sodium crackers. Vegetables Fresh or frozen vegetables (raw, steamed, roasted, or grilled). Low-sodium or reduced-sodium tomato and vegetable juices. Low-sodium or reduced-sodium tomato sauce  and paste. Low-sodium or reduced-sodium canned vegetables.  Fruits All fresh, canned (in natural juice), or frozen fruits. Meat and Other Protein Products Ground beef (85% or leaner), grass-fed beef, or beef trimmed of fat. Skinless chicken or [place]. Ground chicken or [place]. Pork trimmed of fat. All fish and seafood. Eggs. Dried beans, peas, or lentils. Unsalted nuts and seeds. Unsalted canned beans. Dairy Low-fat dairy products, such as skim or 1% milk, 2% or reduced-fat cheeses, low-fat ricotta or cottage cheese, or plain low-fat yogurt. Low-sodium or reduced-sodium cheeses. Fats and Oils Tub margarines without trans fats. Light or reduced-fat mayonnaise and salad dressings (reduced sodium). Avocado. Safflower, olive, or canola oils. Natural peanut or almond butter. Other Unsalted popcorn and pretzels. The items listed above may not be a complete list of recommended foods or beverages. Contact your dietitian for more options. WHAT FOODS ARE NOT RECOMMENDED? Grains White bread. White pasta. White rice. Refined cornbread. Bagels and croissants. Crackers that contain trans fat. Vegetables Creamed or fried vegetables. Vegetables in a cheese sauce. Regular canned vegetables. Regular canned tomato sauce and paste. Regular tomato and vegetable juices. Fruits Dried fruits. Canned fruit in light or heavy syrup. Fruit juice. Meat and Other Protein Products Fatty cuts of meat. Ribs, chicken wings, bacon, sausage, bologna, salami, chitterlings, fatback, hot dogs, bratwurst, and packaged luncheon meats. Salted nuts and seeds. Canned beans with salt. Dairy Whole or 2% milk, cream, half-and-half, and cream cheese. Whole-fat or sweetened yogurt. Full-fat cheeses or blue cheese. Nondairy creamers and whipped toppings. Processed cheese, cheese spreads, or cheese curds. Condiments Onion and garlic salt, seasoned salt, table salt, and sea salt. Canned and packaged gravies. Worcestershire sauce. Tartar sauce.  Barbecue sauce. Teriyaki sauce. Soy sauce, including reduced sodium. Steak sauce. Fish sauce. Oyster sauce. Cocktail sauce. Horseradish. Ketchup and mustard.  Meat flavorings and tenderizers. Bouillon cubes. Hot sauce. Tabasco sauce. Marinades. Taco seasonings. Relishes. Fats and Oils Butter, stick margarine, lard, shortening, ghee, and bacon fat. Coconut, palm kernel, or palm oils. Regular salad dressings. Other Pickles and olives. Salted popcorn and pretzels. The items listed above may not be a complete list of foods and beverages to avoid. Contact your dietitian for more information. WHERE CAN I FIND MORE INFORMATION? National Heart, Lung, and Blood Institute: travelstabloid.com   This information is not intended to replace advice given to you by your health care provider. Make sure you discuss any questions you have with your health care provider.   Document Released: 03/16/2011 Document Revised: 04/17/2014 Document Reviewed: 01/29/2013 Elsevier Interactive Patient Education [employer].

## 2016-02-11 NOTE — Progress Notes (Signed)
Subjective:    Patient ID: [name], female    DOB: Mar 06, 1946, 70 y.o.   MRN: [id number]  HPI Chief Complaint  Patient presents with  . new pt    new pt stomach and back pain.   . needs bp meds    needs bp med refill as she is taking her husband bp med,    She is a 70 year old female with a history of hypertension, diabetes mellitus type 2, GERD, hyperlipidemia, diverticulosis and depression who is new to the practice and here to establish care. She did have acute diverticulitis in 2010.   Per her chart, she had a normal stress test August 2009. States she had a gallbladder attack and had the stress test but does not recall seeing a cardiologist. States she had gallbladder stones.   She complains that she has had right lower quadrant pain that radiates to her right flank for 2 days this week. States pain has resolved. She was curious if this could have been her gallbladder acting up. States the pain did not appear to be related to eating or movement. Denies fever, chills, N/V/D. No urinary symptoms.   States she intentionally lost 75 lbs over the past 3 years and thought this would have controlled her diabetes so she did not feel the need to take medication.   States she is having some numbness and tingling to the bottom of her feet for past 3 months.   Previous medical care: she was a patient of Dr. Alysia Penna in the past but has not been seen there since 2010. She states she stopped going because of a loss of insurance.   Last CPE: years ago  Other providers: none  Past medical history: HTN. Per her chart, she was taking Metoprolol Tartrate 25 mg (1/2 two time a day) and Micardis 80 mg once daily. She has been taking her husband's BP medication for the past she has been taking a 25 mg tablet of metoprolol once daily since 2011. States she felt very tired on Micardis and does not want this medication again. She states she has tried multiple BP medications in the past but  cannot recall the names.   Diabetes type 2 diagnosed in  2009?     Last A1C was in 2010 and was 8.0%,previously she was taking metformin and glyburide. She thought she was having side effects to glyburide and does not want to take this again.    Hyperlipidemia- was taking Crestor 20 mg.  Is also on Aspirin 81 mg.  GERD- was taking nexium 40 mg once daily but no longer needs this.  History of Vitamin D deficiency- is taking vitamin D 1,000 IU daily.  Is taking tumeric. Vitamin C. Holy Basil from deep roots- for anxiety. Takes 2 every night.    Surgeries: Appendectomy, tonsillectomy, colonoscopy in March 2008.  Family history: MGM with stomach cancer. Mother with colon cancer in her 15s, HTN, hyperlipidemia, cardiac myopathy.  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 2 years of college.  Denies smoking, drinking alcohol, drug use  Diet: eats a lot of sweets. Tries to limit fat and carbohydrates.  Excerise: none  Immunizations: years ago and refuses flu shot.   Health maintenance:  Mammogram: never Colonoscopy: 2008  Reviewed allergies, medications, past medical, surgical, family, and social history.    Review of Systems Pertinent positives and negatives in the history of present illness.  Objective:   Physical Exam  Constitutional: She is oriented to person, place, and time. She appears well-developed and well-nourished. No distress.  Eyes: Pupils are equal, round, and reactive to light.  Neck: Normal range of motion. Neck supple. No JVD present. No thyromegaly present.  Cardiovascular: Normal rate, regular rhythm and intact distal pulses.   Pulmonary/Chest: Effort normal and breath sounds normal.  Abdominal: Soft. Bowel sounds are normal. She exhibits no distension. There is no hepatosplenomegaly. There is no tenderness. There is no rebound, no guarding and no CVA tenderness.  Neurological: She is alert  and oriented to person, place, and time. She has normal strength. Gait normal.  Skin: Skin is warm and dry. No rash noted. No pallor.  Psychiatric: She has a normal mood and affect. Her speech is normal and behavior is normal. Thought content normal.   BP (!) 144/94   Pulse 65   Temp 98 F (36.7 C) (Oral)   Wt 195 lb 9.6 oz (88.7 kg)   BMI 32.55 kg/m   Hemoglobin A1C 12.4%  Urine dipstick: cannot urinate, will return for this.  Urine microalbumin: cannot urinate, will return for this.     Assessment & Plan:  Essential hypertension - Plan: CBC with Differential/Platelet, COMPLETE METABOLIC PANEL WITH GFR, EKG 12-Lead, Lipid panel, metoprolol tartrate (LOPRESSOR) 25 MG tablet  Encounter to establish care  Diverticulosis of colon  History of hyperlipidemia  Obesity (BMI 30.0-34.9)  Screening for heart disease - Plan: EKG 12-Lead, Lipid panel  Uncontrolled type 2 diabetes mellitus without complication, without long-term current use of insulin ([place]) - Plan: CBC with Differential/Platelet, COMPLETE METABOLIC PANEL WITH GFR, EKG 12-Lead, TSH, Lipid panel, POCT glycosylated hemoglobin (Hb A1C), empagliflozin (JARDIANCE) 10 MG TABS tablet, metFORMIN (GLUCOPHAGE) 500 MG tablet, glucose blood (ACCU-CHEK AVIVA) test strip, Lancet Devices (ACCU-CHEK SOFTCLIX) lancets, Microalbumin / creatinine urine ratio, POCT urinalysis dipstick, Microalbumin / creatinine urine ratio, POCT urinalysis dipstick, DISCONTINUED: metFORMIN (GLUCOPHAGE) 500 MG tablet, CANCELED: Microalbumin / creatinine urine ratio, CANCELED: POCT urinalysis dipstick  Her abdominal exam is normal and she is asymptomatic. Plan to have her just keep an eye on things and if her pain returns she will follow up.  HTN- blood pressure is not within goal. Will prescribe her Metoprolol and have her return in 2 weeks with her blood pressure readings from home. She will bring in her BP cuff.  Diabetes: her A1C is 12.4% and far from being  controlled. She has not been checking her blood sugars or taking medication for this in several years. No recent eye exam.  Plan to start her on Metformin BID and Jardiance. Samples of Jardiance given #14 tabs. She will be prescribed a meter and supplies and advised to check her blood sugar 2 times daily and return with these readings in 2 weeks. Counseled on diet and exercise.  Plan to check lipids, she is fasting.  ECG ordered for baseline due to cardiac risk factors. Dr. Redmond School also looked at ECG and no acute changes noted.  Discussed controlling her blood pressure, blood sugars and cholesterol in order to prevent heart disease, stroke and death.  Will follow up in 2 weeks pending labs.  She will return with a urine specimen for microalbumin and UA dipstick.  Spent at least 45 minutes face to face with patient and at least 50% was in counseling and coordination of care.

## 2016-02-12 LAB — LIPID PANEL
CHOLESTEROL: 214 mg/dL — AB (ref 125–200)
HDL: 40 mg/dL — ABNORMAL LOW (ref >=46)
LDL Cholesterol: 139 mg/dL — ABNORMAL HIGH (ref <130)
TRIGLYCERIDES: 177 mg/dL — AB (ref <150)
Total CHOL/HDL Ratio: 5.4 Ratio — ABNORMAL HIGH (ref <=5.0)
VLDL: 35 mg/dL — ABNORMAL HIGH (ref <30)

## 2016-02-12 LAB — COMPLETE METABOLIC PANEL WITH GFR
ALBUMIN: 4.1 g/dL (ref 3.6–5.1)
ALK PHOS: 113 U/L (ref 33–130)
ALT: 25 U/L (ref 6–29)
AST: 21 U/L (ref 10–35)
BILIRUBIN TOTAL: 0.7 mg/dL (ref 0.2–1.2)
BUN: 14 mg/dL (ref 7–25)
CO2: 23 mmol/L (ref 20–31)
Calcium: 9.2 mg/dL (ref 8.6–10.4)
Chloride: 100 mmol/L (ref 98–110)
Creat: 0.63 mg/dL (ref 0.50–0.99)
Glucose, Bld: 270 mg/dL — ABNORMAL HIGH (ref 65–99)
Potassium: 4.2 mmol/L (ref 3.5–5.3)
Sodium: 135 mmol/L (ref 135–146)
TOTAL PROTEIN: 6.8 g/dL (ref 6.1–8.1)

## 2016-02-12 LAB — MICROALBUMIN / CREATININE URINE RATIO
CREATININE, URINE: 132 mg/dL (ref 20–320)
Microalb Creat Ratio: 27 mcg/mg creat (ref <30)
Microalb, Ur: 3.6 mg/dL

## 2016-02-24 ENCOUNTER — Ambulatory Visit (INDEPENDENT_AMBULATORY_CARE_PROVIDER_SITE_OTHER): Payer: Commercial Managed Care - HMO | Admitting: Family Medicine

## 2016-02-24 ENCOUNTER — Encounter: Payer: Self-pay | Admitting: Family Medicine

## 2016-02-24 VITALS — BP 150/90 | HR 74 | Wt 194.0 lb

## 2016-02-24 DIAGNOSIS — E782 Mixed hyperlipidemia: Secondary | ICD-10-CM | POA: Diagnosis not present

## 2016-02-24 DIAGNOSIS — I10 Essential (primary) hypertension: Secondary | ICD-10-CM | POA: Diagnosis not present

## 2016-02-24 DIAGNOSIS — E1165 Type 2 diabetes mellitus with hyperglycemia: Secondary | ICD-10-CM | POA: Diagnosis not present

## 2016-02-24 DIAGNOSIS — IMO0001 Reserved for inherently not codable concepts without codable children: Secondary | ICD-10-CM

## 2016-02-24 LAB — GLUCOSE, POCT (MANUAL RESULT ENTRY): POC GLUCOSE: 166 mg/dL — AB (ref 70–99)

## 2016-02-24 MED ORDER — LISINOPRIL 5 MG PO TABS: 5.0000 mg | ORAL_TABLET | Freq: Every day | ORAL | 3 refills | Status: DC

## 2016-02-24 MED ORDER — ATORVASTATIN CALCIUM 10 MG PO TABS: 10.0000 mg | ORAL_TABLET | Freq: Every day | ORAL | 0 refills | Status: DC

## 2016-02-24 NOTE — Progress Notes (Signed)
Subjective:    Patient ID: [name], female    DOB: [birth year], 70 y.o.   MRN: [id number]  HPI Chief Complaint  Patient presents with  . 2 week follow-up    2 week follow-up on bs, bp and blood work.    She is here to follow-up on diabetes, hypertension, mixed hyperlipidemia.  States she has been doing well on her medications. Has not had any concerns or complaints but she reports some mild dizziness since coming here today. States she feels like she just got off a merry-go-round. Dizziness lasted approximately 3-4 minutes and resolved without intervention.   Had a cup of coffee and toast this morning, state she usually has 3 cups of coffee in the morning. Has not had water this morning.  Denies fever, chills, vision changes, headache, chest pain, palpitations, shortness of breath, abdominal pain, N/V/D, LE edema.   Her hemoglobin A1c was 12.4% at her last visit on 02/19/2016. Blood sugars at home have been fasting 140-194. States she has realized that the food she eats in the evening has a direct effect on her fasting blood sugars. She agrees to a referral and will schedule an appointment with the nutritionist.  States she is taking Metformin twice daily and Jardiance in the morning. No side effects.   She has not been checking her blood pressure at home.   Discussed that her lipid panel is abnormal and increases her risk for heart disease or stroke.  She is willing to start taking a statin. States she took one in the distant past but stopped for no specific reason.     Review of Systems Pertinent positives and negatives in the history of present illness.     Objective:   Physical Exam  Constitutional: She is oriented to person, place, and time. She appears well-developed and well-nourished. No distress.  Eyes: Conjunctivae and EOM are normal. Pupils are equal, round, and reactive to light.  Neck: Normal range of motion. Neck supple.  Cardiovascular: Normal rate and  regular rhythm.   Pulmonary/Chest: Effort normal and breath sounds normal.  Musculoskeletal: Normal range of motion.  Neurological: She is alert and oriented to person, place, and time. No cranial nerve deficit. Coordination normal.  Skin: Skin is warm and dry. No pallor.  Psychiatric: She has a normal mood and affect. Her behavior is normal. Thought content normal.   BP (!) 150/88   Pulse 64   Wt 194 lb (88 kg)   BMI 32.28 kg/m      Assessment & Plan:  Essential hypertension - Plan: Amb ref to Medical Nutrition Therapy-MNT, lisinopril (PRINIVIL,ZESTRIL) 5 MG tablet  Uncontrolled type 2 diabetes mellitus without complication, without long-term current use of insulin (HCC) - Plan: Amb ref to Medical Nutrition Therapy-MNT  Mixed hyperlipidemia - Plan: Amb ref to Medical Nutrition Therapy-MNT, atorvastatin (LIPITOR) 10 MG tablet  She is not orthostatic. Random blood glucose 166. Repeat BP is 144/90. States the dizziness was brief and she feels back to normal. Discussed that her BP is not within goal range. Plan to add a low dose ace inhibitor and she agrees to this.  Discussed importance of checking daily blood pressures since we are adding a new medication.  Plan to start her on statin therapy. Reiterated healthy diet and lifestyle.  She will continue taking medication for diabetes and checking her blood sugars.  Referral made to nutritionist.  Follow up in 1 month or sooner if readings are abnormal at home.

## 2016-02-24 NOTE — Patient Instructions (Signed)
Continue checking your blood sugars fasting daily and 2 hours after a meal.  Check your blood pressures daily as well.

## 2016-02-28 DIAGNOSIS — H5213 Myopia, bilateral: Secondary | ICD-10-CM | POA: Diagnosis not present

## 2016-03-17 ENCOUNTER — Encounter: Payer: Self-pay | Admitting: Skilled Nursing Facility1

## 2016-03-17 ENCOUNTER — Encounter: Payer: Commercial Managed Care - HMO | Attending: Family Medicine | Admitting: Skilled Nursing Facility1

## 2016-03-17 DIAGNOSIS — Z713 Dietary counseling and surveillance: Secondary | ICD-10-CM | POA: Insufficient documentation

## 2016-03-17 DIAGNOSIS — E782 Mixed hyperlipidemia: Secondary | ICD-10-CM | POA: Diagnosis not present

## 2016-03-17 DIAGNOSIS — I10 Essential (primary) hypertension: Secondary | ICD-10-CM | POA: Diagnosis not present

## 2016-03-17 DIAGNOSIS — E1165 Type 2 diabetes mellitus with hyperglycemia: Secondary | ICD-10-CM | POA: Insufficient documentation

## 2016-03-17 DIAGNOSIS — IMO0001 Reserved for inherently not codable concepts without codable children: Secondary | ICD-10-CM

## 2016-03-17 NOTE — Progress Notes (Signed)
Diabetes Self-Management Education  Visit Type: First/Initial  Appt. Start Time: 8:18 Appt. End Time: 9:18  03/17/2016  [name], identified by name and date of birth, is a 70 y.o. female with a diagnosis of Diabetes: Type 2.   ASSESSMENT  Height 5\' 5"  (1.651 m), weight 185 lb (83.9 kg). Body mass index is 30.79 kg/m. Pt states her numbers from from 145-240: checking in the morning before she has eaten and 2 hours after a meal. Pt states she is a heavy snacker, snacking throughout the day. Pt states she has lost and kept off 80 pounds. Pt states she used to not take diabetes seriously. Pt states she has diverticulitis flare ups from raw vegetables, nuts, and seeded foods describing the flare up as some bloating.      Diabetes Self-Management Education - 03/17/16 0825      Visit Information   Visit Type First/Initial     Initial Visit   Diabetes Type Type 2   Are you currently following a meal plan? No   Are you taking your medications as prescribed? Yes   Date Diagnosed 2010     Health Coping   How would you rate your overall health? Good     Psychosocial Assessment   Patient Belief/Attitude about Diabetes Motivated to manage diabetes     Pre-Education Assessment   Patient understands the diabetes disease and treatment process. Needs Instruction   Patient understands incorporating nutritional management into lifestyle. Needs Instruction   Patient undertands incorporating physical activity into lifestyle. Needs Instruction   Patient understands using medications safely. Needs Instruction   Patient understands monitoring blood glucose, interpreting and using results Needs Instruction   Patient understands prevention, detection, and treatment of acute complications. Needs Instruction   Patient understands prevention, detection, and treatment of chronic complications. Needs Instruction   Patient understands how to develop strategies to address psychosocial issues.  Needs Instruction   Patient understands how to develop strategies to promote health/change behavior. Needs Instruction     Complications   Last HgB A1C per patient/outside source 12.4 %   How often do you check your blood sugar? 1-2 times/day   Fasting Blood glucose range (mg/dL) 130-179   Postprandial Blood glucose range (mg/dL) >200   Have you had a dilated eye exam in the past 12 months? Yes   Have you had a dental exam in the past 12 months? Yes   Are you checking your feet? Yes   How many days per week are you checking your feet? 3     Dietary Intake   Breakfast toast with peanutbutter------crackers and cheese-----oatmeal   Snack (morning) crackers   Lunch soup----ham wrap-----chicken salad and hummus   Snack (afternoon) cookies   Dinner chicken and vegeatbles and potatoes   Snack (evening) ice cream   Beverage(s) coffee, water     Exercise   Exercise Type ADL's     Patient Education   Previous Diabetes Education Yes (please comment)  2010   Disease state  Definition of diabetes, type 1 and 2, and the diagnosis of diabetes;Factors that contribute to the development of diabetes   Nutrition management  Role of diet in the treatment of diabetes and the relationship between the three main macronutrients and blood glucose level;Food label reading, portion sizes and measuring food.;Carbohydrate counting;Reviewed blood glucose goals for pre and post meals and how to evaluate the patients' food intake on their blood glucose level.;Meal options for control of blood glucose level and chronic complications.  Physical activity and exercise  Role of exercise on diabetes management, blood pressure control and cardiac health.;Helped patient identify appropriate exercises in relation to his/her diabetes, diabetes complications and other health issue.   Monitoring Purpose and frequency of SMBG.;Yearly dilated eye exam;Daily foot exams   Chronic complications Lipid levels, blood glucose control  and heart disease;Dental care;Nephropathy, what it is, prevention of, the use of ACE, ARB's and early detection of through urine microalbumia.;Reviewed with patient heart disease, higher risk of, and prevention;Retinopathy and reason for yearly dilated eye exams;Assessed and discussed foot care and prevention of foot problems   Psychosocial adjustment Role of stress on diabetes     Individualized Goals (developed by patient)   Nutrition Follow meal plan discussed;Adjust meds/carbs with exercise as discussed   Physical Activity Exercise 1-2 times per week;15 minutes per day     Post-Education Assessment   Patient understands the diabetes disease and treatment process. Demonstrates understanding / competency   Patient understands incorporating nutritional management into lifestyle. Demonstrates understanding / competency   Patient undertands incorporating physical activity into lifestyle. Demonstrates understanding / competency   Patient understands using medications safely. Demonstrates understanding / competency   Patient understands monitoring blood glucose, interpreting and using results Demonstrates understanding / competency   Patient understands prevention, detection, and treatment of acute complications. Demonstrates understanding / competency   Patient understands prevention, detection, and treatment of chronic complications. Demonstrates understanding / competency   Patient understands how to develop strategies to address psychosocial issues. Demonstrates understanding / competency   Patient understands how to develop strategies to promote health/change behavior. Demonstrates understanding / competency     Outcomes   Expected Outcomes Demonstrated interest in learning. Expect positive outcomes   Future DMSE PRN   Program Status Completed      Individualized Plan for Diabetes Self-Management Training:   Learning Objective:  Patient will have a greater understanding of diabetes  self-management. Patient education plan is to attend individual and/or group sessions per assessed needs and concerns.   Plan:   Patient Instructions  -Always bring your meter with you everywhere you go -Always Properly dispose of your needles:  -Discard in a hard plastic/metal container with a lid (something the needle can't puncture)  -Write Do Not Recycle on the outside of the container  -Example: A laundry detergent bottle -Never use the same needle more than once -Eat 2-3 carbohydrate choices for each meal and 1 for each snack -A meal: carbohydrates, protein, vegetable -A snack: A Fruit OR Vegetable AND Protein  -Try to be more active -Always pay attention to your body keeping watchful of possible low blood sugar (below 70) or high blood sugar (above 200)   -Check your feet every day Looking for anything that was not there before  -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 Expect positive outcomes  Education material provided: Living Well with Diabetes, Meal plan card, My Plate and Snack sheet  If problems or questions, patient to contact team via:  Phone  Future DSME appointment: PRN

## 2016-03-17 NOTE — Patient Instructions (Addendum)
-  Always bring your meter with you everywhere you go -Always Properly dispose of your needles:  -Discard in a hard plastic/metal container with a lid (something the needle can't puncture)  -Write Do Not Recycle on the outside of the container  -Example: A laundry detergent bottle -Never use the same needle more than once -Eat 2-3 carbohydrate choices for each meal and 1 for each snack -A meal: carbohydrates, protein, vegetable -A snack: A Fruit OR Vegetable AND Protein  -Try to be more active -Always pay attention to your body keeping watchful of possible low blood sugar (below 70) or high blood sugar (above 200)   -Check your feet every day Looking for anything that was not there before  -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

## 2016-03-19 ENCOUNTER — Encounter (HOSPITAL_COMMUNITY): Payer: Self-pay

## 2016-03-19 ENCOUNTER — Emergency Department (HOSPITAL_COMMUNITY)
Admission: EM | Admit: 2016-03-19 | Discharge: 2016-03-20 | Disposition: A | Discharge status: Home / Self Care | Payer: Commercial Managed Care - HMO | Attending: Emergency Medicine | Admitting: Emergency Medicine

## 2016-03-19 DIAGNOSIS — Z7984 Long term (current) use of oral hypoglycemic drugs: Secondary | ICD-10-CM | POA: Insufficient documentation

## 2016-03-19 DIAGNOSIS — R040 Epistaxis: Secondary | ICD-10-CM | POA: Diagnosis not present

## 2016-03-19 DIAGNOSIS — R030 Elevated blood-pressure reading, without diagnosis of hypertension: Secondary | ICD-10-CM | POA: Diagnosis not present

## 2016-03-19 DIAGNOSIS — I10 Essential (primary) hypertension: Secondary | ICD-10-CM | POA: Diagnosis not present

## 2016-03-19 DIAGNOSIS — E119 Type 2 diabetes mellitus without complications: Secondary | ICD-10-CM | POA: Insufficient documentation

## 2016-03-19 NOTE — ED Provider Notes (Signed)
[facility] DEPT Provider Note   CSN: [id number] Arrival date & time: 03/19/16  2320  By signing my name below, I, Gwenlyn Fudge, attest that this documentation has been prepared under the direction and in the presence of Eliezer Mccoy, PA-C. Electronically Signed: Gwenlyn Fudge, ED Scribe. 03/20/16. 12:15 AM.  History   Chief Complaint Chief Complaint  Patient presents with  . Epistaxis  . Hypertension   The history is provided by the patient. No language interpreter was used.   HPI Comments: [name] is a 70 y.o. female with PMHx of HTN who presents to the Emergency Department complaining of episodic, nose bleeds onset 8 PM. She had her first episode at 8 PM that self resolved. She had another episode starting at 10 PM that continued to bleed for 45-60 minutes. She states the blood was "pouring out" during the second episode. Bleeding was controlled by EMS. Pt's baseline blood pressure is 142/68, but was 240/110 measured by EMS. She is compliant with blood pressure medications. Pt denies chest pain, shortness of breath, nausea, vomiting, abdominal pain, dysuria, urgency, congestion, dizziness, and pain.  Past Medical History:  Diagnosis Date  . Diverticulosis   . DM (diabetes mellitus) ([place])   . GERD (gastroesophageal reflux disease)   . HTN (hypertension)   . Hyperlipidemia     Patient Active Problem List   Diagnosis Date Noted  . DEPRESSION 11/18/2008  . DIVERTICULITIS, ACUTE 11/18/2008  . Uncontrolled diabetes mellitus type 2 without complications ([place]) [id number]  . GERD 08/11/2008  . MYALGIA 11/20/2007  . CHEST PAIN, SUBSTERNAL 11/20/2007  . Hyperlipidemia 10/26/2006  . Essential hypertension 10/26/2006  . Diverticulosis of colon 10/26/2006  . HEART MURMUR 10/26/2006  . CHICKENPOX, HX OF 10/26/2006    Past Surgical History:  Procedure Laterality Date  . APPENDECTOMY    . TONSILLECTOMY      OB History    No data available     Home Medications     Prior to Admission medications   Medication Sig Start Date End Date Taking? Authorizing Provider  atorvastatin (LIPITOR) 10 MG tablet Take 1 tablet (10 mg total) by mouth daily. 02/24/16   Girtha Rm, NP  empagliflozin (JARDIANCE) 10 MG TABS tablet Take 10 mg by mouth daily. 02/11/16   Girtha Rm, NP  glucose blood (ACCU-CHEK AVIVA) test strip Test twice a day. Needs an accu-chek meter as well. Dx code- e11.9 02/11/16   Girtha Rm, NP  Lancet Devices [facility]) lancets Test twice a day. Dx code E11.9 02/11/16   Girtha Rm, NP  lisinopril (PRINIVIL,ZESTRIL) 5 MG tablet Take 1 tablet (5 mg total) by mouth daily. 02/24/16   Girtha Rm, NP  metFORMIN (GLUCOPHAGE) 500 MG tablet Take 1 tablet (500 mg total) by mouth 2 (two) times daily with a meal. 02/11/16   Girtha Rm, NP  metoprolol tartrate (LOPRESSOR) 25 MG tablet Take 1 tablet (25 mg total) by mouth daily. 02/11/16   Girtha Rm, NP    Family History No family history on file.  Social History Social History  Substance Use Topics  . Smoking status: Never Smoker  . Smokeless tobacco: Never Used  . Alcohol use No   Allergies   Tetracycline  Review of Systems Review of Systems  Constitutional: Negative for chills and fever.  HENT: Positive for nosebleeds. Negative for congestion, facial swelling and sore throat.   Respiratory: Negative for shortness of breath.   Cardiovascular: Negative for chest pain.  Gastrointestinal: Negative for abdominal pain, nausea and vomiting.  Genitourinary: Negative for dysuria and urgency.  Musculoskeletal: Negative for back pain.  Skin: Negative for rash and wound.  Neurological: Negative for dizziness and headaches.  Psychiatric/Behavioral: The patient is not nervous/anxious.    Physical Exam Updated Vital Signs BP 153/74 (BP Location: Right Arm)   Pulse 106   Temp 98.4 F (36.9 C) (Oral)   Resp 25   Ht 5\' 5"  (1.651 m)   Wt 83.9 kg   SpO2 96%   BMI  30.79 kg/m   Physical Exam  Constitutional: She appears well-developed and well-nourished. No distress.  HENT:  Head: Normocephalic and atraumatic.  Nose: No epistaxis.  Mouth/Throat: Oropharynx is clear and moist. No oropharyngeal exudate.  No active bleeding in the nares  Eyes: Conjunctivae are normal. Pupils are equal, round, and reactive to light. Right eye exhibits no discharge. Left eye exhibits no discharge. No scleral icterus.  Neck: Normal range of motion. Neck supple. No thyromegaly present.  Cardiovascular: Regular rhythm and intact distal pulses.  Exam reveals no gallop and no friction rub.   Murmur (systolic) heard. Pulmonary/Chest: Effort normal and breath sounds normal. No stridor. No respiratory distress. She has no wheezes. She has no rales.  Abdominal: Soft. Bowel sounds are normal. She exhibits no distension. There is no tenderness. There is no rebound and no guarding.  Musculoskeletal: She exhibits no edema.  Lymphadenopathy:    She has no cervical adenopathy.  Neurological: She is alert. Coordination normal.  Skin: Skin is warm and dry. No rash noted. She is not diaphoretic. No pallor.  Psychiatric: She has a normal mood and affect.  Nursing note and vitals reviewed.  ED Treatments / Results  DIAGNOSTIC STUDIES: Oxygen Saturation is 97% on RA, normal by my interpretation.    COORDINATION OF CARE: 12:11 AM Discussed treatment plan with pt at bedside which includes Afrin and continued observation for bleeding and pt agreed to plan.  Labs (all labs ordered are listed, but only abnormal results are displayed) Labs Reviewed - No data to display  EKG  EKG Interpretation None       Radiology No results found.  Procedures Procedures (including critical care time)  Medications Ordered in ED Medications  oxymetazoline (AFRIN) 0.05 % nasal spray 1 spray (1 spray Each Nare Given 03/20/16 0120)     Initial Impression / Assessment and Plan / ED Course  I  have reviewed the triage vital signs and the nursing notes.  Pertinent labs & imaging results that were available during my care of the patient were reviewed by me and considered in my medical decision making (see chart for details).  Clinical Course     Patient's epistaxis self resolved in the ED. Patient reported that EMS extracted 2 large drinks of clots en route. I had patient blow her nose and found no more clots. Afrin was applied. No further bleeding. Blood pressure down to 153/74. Patient sent home with Afrin and instructions to try if bleeding begins again. I recommended patient have a dehumidifier in her bedroom to help prevent further nosebleeds. Patient to follow-up with ENT if symptoms are continuing. Return precautions discussed. Patient understands and agrees with plan. Patient stable and discharged in satisfactory condition. I discussed case with Dr. Dina Rich who guided the patient's management and agrees with plan.  Final Clinical Impressions(s) / ED Diagnoses   Final diagnoses:  Epistaxis   I personally performed the services described in this documentation, which was scribed in  my presence. The recorded information has been reviewed and is accurate.  New Prescriptions New Prescriptions   No medications on file     Frederica Kuster, Hershal Coria 03/20/16 0214    Merryl Hacker, MD 03/21/16 [phone]

## 2016-03-19 NOTE — ED Triage Notes (Addendum)
Per EMS pt from home called out for uncontrolled nosebleed. Pt states around 9 pm tonight had a small nosebleed that stopped on its on. Around 10 pm it begun again and was "pouring out." EMS states bleeding stopped upon arrival, approximately an hour later.   EMS adds pts manual blood pressure of 240/110. States pt has HTN and is compliant with medications. Pt denies dizziness. Pt denies any pain.

## 2016-03-19 NOTE — ED Notes (Signed)
Patient bleeding controlled from nose.

## 2016-03-19 NOTE — ED Notes (Signed)
Bed: [id number] Expected date:  Expected time:  Means of arrival:  Comments: EMS HTN/nosebleed

## 2016-03-20 MED ORDER — OXYMETAZOLINE HCL 0.05 % NA SOLN
1.0000 | Freq: Once | NASAL | Status: AC
  Administered 2016-03-20: 1 via NASAL
  Filled 2016-03-20: qty 15

## 2016-03-20 NOTE — Discharge Instructions (Signed)
Medications: Afrin  Treatment: If your nose begins to bleed again, apply pressure and below as many clots as possible and apply 1 spray to the affected nostril. I also recommend having a humidifier in your room to prevent the dry air causing nosebleeds.  Follow-up: Please follow-up with the ear nose and throat doctor, Dr. Constance Holster, if your symptoms are continuing. Please return to the emergency department if you develop any new or worsening symptoms.

## 2016-03-21 ENCOUNTER — Other Ambulatory Visit: Payer: Self-pay | Admitting: Family Medicine

## 2016-03-21 ENCOUNTER — Telehealth: Payer: Self-pay

## 2016-03-21 DIAGNOSIS — IMO0001 Reserved for inherently not codable concepts without codable children: Secondary | ICD-10-CM

## 2016-03-21 DIAGNOSIS — E782 Mixed hyperlipidemia: Secondary | ICD-10-CM

## 2016-03-21 DIAGNOSIS — E1165 Type 2 diabetes mellitus with hyperglycemia: Secondary | ICD-10-CM

## 2016-03-21 DIAGNOSIS — I10 Essential (primary) hypertension: Secondary | ICD-10-CM

## 2016-03-21 MED ORDER — ATORVASTATIN CALCIUM 10 MG PO TABS: 10.0000 mg | ORAL_TABLET | Freq: Every day | ORAL | 0 refills | Status: DC

## 2016-03-21 MED ORDER — METOPROLOL TARTRATE 25 MG PO TABS: 25.0000 mg | ORAL_TABLET | Freq: Every day | ORAL | 0 refills | Status: DC

## 2016-03-21 MED ORDER — EMPAGLIFLOZIN 10 MG PO TABS: 10.0000 mg | ORAL_TABLET | Freq: Every day | ORAL | 0 refills | Status: DC

## 2016-03-21 NOTE — Telephone Encounter (Signed)
90 day refill request to [facility] of Metoprolol, Jardiance, and atorvastatin.

## 2016-03-21 NOTE — Telephone Encounter (Signed)
done

## 2016-03-27 ENCOUNTER — Ambulatory Visit: Payer: Commercial Managed Care - HMO | Admitting: Family Medicine

## 2016-05-11 ENCOUNTER — Other Ambulatory Visit: Payer: Self-pay | Admitting: Family Medicine

## 2016-05-11 DIAGNOSIS — E1165 Type 2 diabetes mellitus with hyperglycemia: Principal | ICD-10-CM

## 2016-05-11 DIAGNOSIS — IMO0001 Reserved for inherently not codable concepts without codable children: Secondary | ICD-10-CM

## 2016-06-17 ENCOUNTER — Other Ambulatory Visit: Payer: Self-pay | Admitting: Family Medicine

## 2016-06-17 DIAGNOSIS — E782 Mixed hyperlipidemia: Secondary | ICD-10-CM

## 2016-07-06 ENCOUNTER — Other Ambulatory Visit: Payer: Self-pay | Admitting: Family Medicine

## 2016-07-06 DIAGNOSIS — I10 Essential (primary) hypertension: Secondary | ICD-10-CM

## 2016-10-01 ENCOUNTER — Other Ambulatory Visit: Payer: Self-pay | Admitting: Family Medicine

## 2016-10-01 DIAGNOSIS — E1165 Type 2 diabetes mellitus with hyperglycemia: Secondary | ICD-10-CM

## 2016-10-01 DIAGNOSIS — IMO0001 Reserved for inherently not codable concepts without codable children: Secondary | ICD-10-CM

## 2016-10-01 DIAGNOSIS — I10 Essential (primary) hypertension: Secondary | ICD-10-CM

## 2016-10-02 NOTE — Telephone Encounter (Signed)
Pt schedule appt for July 2nd. Pt has enough meds to last to her appt. I will deny meds

## 2016-10-09 ENCOUNTER — Ambulatory Visit (INDEPENDENT_AMBULATORY_CARE_PROVIDER_SITE_OTHER): Payer: Medicare HMO | Admitting: Family Medicine

## 2016-10-09 ENCOUNTER — Encounter: Payer: Self-pay | Admitting: Family Medicine

## 2016-10-09 VITALS — BP 120/70 | HR 68 | Wt 202.4 lb

## 2016-10-09 DIAGNOSIS — E1165 Type 2 diabetes mellitus with hyperglycemia: Secondary | ICD-10-CM

## 2016-10-09 DIAGNOSIS — I10 Essential (primary) hypertension: Secondary | ICD-10-CM | POA: Diagnosis not present

## 2016-10-09 DIAGNOSIS — E669 Obesity, unspecified: Secondary | ICD-10-CM

## 2016-10-09 DIAGNOSIS — E782 Mixed hyperlipidemia: Secondary | ICD-10-CM | POA: Diagnosis not present

## 2016-10-09 DIAGNOSIS — Z9119 Patient's noncompliance with other medical treatment and regimen: Secondary | ICD-10-CM

## 2016-10-09 DIAGNOSIS — Z91199 Patient's noncompliance with other medical treatment and regimen due to unspecified reason: Secondary | ICD-10-CM

## 2016-10-09 DIAGNOSIS — IMO0001 Reserved for inherently not codable concepts without codable children: Secondary | ICD-10-CM

## 2016-10-09 LAB — CBC WITH DIFFERENTIAL/PLATELET
BASOS PCT: 1 %
Basophils Absolute: 115 cells/uL (ref 0–200)
Eosinophils Absolute: 345 cells/uL (ref 15–500)
Eosinophils Relative: 3 %
HCT: 43.7 % (ref 35.0–45.0)
Hemoglobin: 14.6 g/dL (ref 11.7–15.5)
LYMPHS PCT: 25 %
Lymphs Abs: 2875 cells/uL (ref 850–3900)
MCH: 29.1 pg (ref 27.0–33.0)
MCHC: 33.4 g/dL (ref 32.0–36.0)
MCV: 87.2 fL (ref 80.0–100.0)
MONOS PCT: 8 %
MPV: 11 fL (ref 7.5–12.5)
Monocytes Absolute: 920 cells/uL (ref 200–950)
NEUTROS PCT: 63 %
Neutro Abs: 7245 cells/uL (ref 1500–7800)
Platelets: 327 10*3/uL (ref 140–400)
RBC: 5.01 MIL/uL (ref 3.80–5.10)
RDW: 13.8 % (ref 11.0–15.0)
WBC: 11.5 10*3/uL — ABNORMAL HIGH (ref 4.0–10.5)

## 2016-10-09 LAB — LIPID PANEL
CHOL/HDL RATIO: 5 ratio — AB (ref <5.0)
CHOLESTEROL: 205 mg/dL — AB (ref <200)
HDL: 41 mg/dL — ABNORMAL LOW (ref >50)
LDL Cholesterol: 124 mg/dL — ABNORMAL HIGH (ref <100)
TRIGLYCERIDES: 198 mg/dL — AB (ref <150)
VLDL: 40 mg/dL — AB (ref <30)

## 2016-10-09 LAB — COMPLETE METABOLIC PANEL WITH GFR
ALT: 22 U/L (ref 6–29)
AST: 17 U/L (ref 10–35)
Albumin: 4.1 g/dL (ref 3.6–5.1)
Alkaline Phosphatase: 73 U/L (ref 33–130)
BUN: 11 mg/dL (ref 7–25)
CHLORIDE: 99 mmol/L (ref 98–110)
CO2: 23 mmol/L (ref 20–31)
CREATININE: 0.57 mg/dL (ref 0.50–0.99)
Calcium: 9.1 mg/dL (ref 8.6–10.4)
Glucose, Bld: 194 mg/dL — ABNORMAL HIGH (ref 65–99)
POTASSIUM: 4.5 mmol/L (ref 3.5–5.3)
Sodium: 135 mmol/L (ref 135–146)
Total Bilirubin: 0.6 mg/dL (ref 0.2–1.2)
Total Protein: 6.6 g/dL (ref 6.1–8.1)

## 2016-10-09 LAB — TSH: TSH: 1.31 m[IU]/L

## 2016-10-09 LAB — POCT GLYCOSYLATED HEMOGLOBIN (HGB A1C): Hemoglobin A1C: 10.1

## 2016-10-09 MED ORDER — GLIPIZIDE 5 MG PO TABS: 5.0000 mg | ORAL_TABLET | Freq: Every day | ORAL | 1 refills | Status: DC

## 2016-10-09 MED ORDER — METFORMIN HCL 1000 MG PO TABS: 1000.0000 mg | ORAL_TABLET | Freq: Two times a day (BID) | ORAL | 0 refills | Status: DC

## 2016-10-09 NOTE — Patient Instructions (Addendum)
Your hemoglobin A1c is 10.1% today and your diabetes is uncontrolled. Goal A1c is <7.0%  I am increasing your Metformin dose to 1,000 mg twice daily with meals. I am also adding a new medication glipizide. Only take this medication with your first meal of the day. If you do not eat then do not take the medication. It can cause low blood sugar.  Check your blood sugar twice daily. Once fasting (before breakfast) and once 2 hours after a meal. Keep a record of these readings and bring in your readings in 4 weeks.   Call your insurance company and find out which strips are covered and let us know.   Call and schedule a diabetic eye exam.   We will call you with lab results.    Diabetes Mellitus and Standards of Medical Care Managing diabetes (diabetes mellitus) can be complicated. Your diabetes treatment may be managed by a team of health care providers, including:  A diet and nutrition specialist (registered dietitian).  A nurse.  A certified diabetes educator (CDE).  A diabetes specialist (endocrinologist).  An eye doctor.  A primary care provider.  A dentist.  Your health care providers follow a schedule in order to help you get the best quality of care. The following schedule is a general guideline for your diabetes management plan. Your health care providers may also give you more specific instructions. HbA1c ( hemoglobin A1c) test This test provides information about blood sugar (glucose) control over the previous 2-3 months. It is used to check whether your diabetes management plan needs to be adjusted.  If you are meeting your treatment goals, this test is done at least 2 times a year.  If you are not meeting treatment goals or if your treatment goals have changed, this test is done 4 times a year.  Blood pressure test  This test is done at every routine medical visit. For most people, the goal is less than 130/80. Ask your health care provider what your goal blood  pressure should be. Dental and eye exams  Visit your dentist two times a year.  If you have type 1 diabetes, get an eye exam 3-5 years after you are diagnosed, and then once a year after your first exam. ? If you were diagnosed with type 1 diabetes as a child, get an eye exam when you are age 39 or older and have had diabetes for 3-5 years. After the first exam, you should get an eye exam once a year.  If you have type 2 diabetes, have an eye exam as soon as you are diagnosed, and then once a year after your first exam. Foot care exam  Visual foot exams are done at every routine medical visit. The exams check for cuts, bruises, redness, blisters, sores, or other problems with the feet.  A complete foot exam is done by your health care provider once a year. This exam includes an inspection of the structure and skin of your feet, and a check of the pulses and sensation in your feet. ? Type 1 diabetes: Get your first exam 3-5 years after diagnosis. ? Type 2 diabetes: Get your first exam as soon as you are diagnosed.  Check your feet every day for cuts, bruises, redness, blisters, or sores. If you have any of these or other problems that are not healing, contact your health care provider. Kidney function test ( urine microalbumin)  This test is done once a year. ? Type 1 diabetes: Get  your first test 5 years after diagnosis. ? Type 2 diabetes: Get your first test as soon as you are diagnosed.  If you have chronic kidney disease (CKD), get a serum creatinine and estimated glomerular filtration rate (eGFR) test once a year. Lipid profile (cholesterol, HDL, LDL, triglycerides)  This test should be done when you are diagnosed with diabetes, and every 5 years after the first test. If you are on medicines to lower your cholesterol, you may need to get this test done every year. ? The goal for LDL is less than 100 mg/dL (5.5 mmol/L). If you are at high risk, the goal is less than 70 mg/dL (3.9  mmol/L). ? The goal for HDL is 40 mg/dL (2.2 mmol/L) for men and 50 mg/dL(2.8 mmol/L) for women. An HDL cholesterol of 60 mg/dL (3.3 mmol/L) or higher gives some protection against heart disease. ? The goal for triglycerides is less than 150 mg/dL (8.3 mmol/L). Immunizations  The yearly flu (influenza) vaccine is recommended for everyone 6 months or older who has diabetes.  The pneumonia (pneumococcal) vaccine is recommended for everyone 2 years or older who has diabetes. If you are 88 or older, you may get the pneumonia vaccine as a series of two separate shots.  The hepatitis B vaccine is recommended for adults shortly after they have been diagnosed with diabetes.  The Tdap (tetanus, diphtheria, and pertussis) vaccine should be given: ? According to normal childhood vaccination schedules, for children. ? Every 10 years, for adults who have diabetes.  The shingles vaccine is recommended for people who have had chicken pox and are 50 years or older. Mental and emotional health  Screening for symptoms of eating disorders, anxiety, and depression is recommended at the time of diagnosis and afterward as needed. If your screening shows that you have symptoms (you have a positive screening result), you may need further evaluation and be referred to a mental health care provider. Diabetes self-management education  Education about how to manage your diabetes is recommended at diagnosis and ongoing as needed. Treatment plan  Your treatment plan will be reviewed at every medical visit. Summary  Managing diabetes (diabetes mellitus) can be complicated. Your diabetes treatment may be managed by a team of health care providers.  Your health care providers follow a schedule in order to help you get the best quality of care.  Standards of care including having regular physical exams, blood tests, blood pressure monitoring, immunizations, screening tests, and education about how to manage your  diabetes.  Your health care providers may also give you more specific instructions based on your individual health. This information is not intended to replace advice given to you by your health care provider. Make sure you discuss any questions you have with your health care provider. Document Released: 01/22/2009 Document Revised: 12/24/2015 Document Reviewed: 12/24/2015 Elsevier Interactive Patient Education  2018 [employer].   Blood Glucose Monitoring, Adult Monitoring your blood sugar (glucose) helps you manage your diabetes. It also helps you and your health care provider determine how well your diabetes management plan is working. Blood glucose monitoring involves checking your blood glucose as often as directed, and keeping a record (log) of your results over time. Why should I monitor my blood glucose? Checking your blood glucose regularly can:  Help you understand how food, exercise, illnesses, and medicines affect your blood glucose.  Let you know what your blood glucose is at any time. You can quickly tell if you are having low blood  glucose (hypoglycemia) or high blood glucose (hyperglycemia).  Help you and your health care provider adjust your medicines as needed.  When should I check my blood glucose? Follow instructions from your health care provider about how often to check your blood glucose. This may depend on:  The type of diabetes you have.  How well-controlled your diabetes is.  Medicines you are taking.  If you have type 1 diabetes:  Check your blood glucose at least 2 times a day.  Also check your blood glucose: ? Before every insulin injection. ? Before and after exercise. ? Between meals. ? 2 hours after a meal. ? Occasionally between 2:00 a.m. and 3:00 a.m., as directed. ? Before potentially dangerous tasks, like driving or using heavy machinery. ? At bedtime.  You may need to check your blood glucose more often, up to 6-10 times a day: ? If you  use an insulin pump. ? If you need multiple daily injections (MDI). ? If your diabetes is not well-controlled. ? If you are ill. ? If you have a history of severe hypoglycemia. ? If you have a history of not knowing when your blood glucose is getting low (hypoglycemia unawareness). If you have type 2 diabetes:  If you take insulin or other diabetes medicines, check your blood glucose at least 2 times a day.  If you are on intensive insulin therapy, check your blood glucose at least 4 times a day. Occasionally, you may also need to check between 2:00 a.m. and 3:00 a.m., as directed.  Also check your blood glucose: ? Before and after exercise. ? Before potentially dangerous tasks, like driving or using heavy machinery.  You may need to check your blood glucose more often if: ? Your medicine is being adjusted. ? Your diabetes is not well-controlled. ? You are ill. What is a blood glucose log?  A blood glucose log is a record of your blood glucose readings. It helps you and your health care provider: ? Look for patterns in your blood glucose over time. ? Adjust your diabetes management plan as needed.  Every time you check your blood glucose, write down your result and notes about things that may be affecting your blood glucose, such as your diet and exercise for the day.  Most glucose meters store a record of glucose readings in the meter. Some meters allow you to download your records to a computer. How do I check my blood glucose? Follow these steps to get accurate readings of your blood glucose: Supplies needed   Blood glucose meter.  Test strips for your meter. Each meter has its own strips. You must use the strips that come with your meter.  A needle to prick your finger (lancet). Do not use lancets more than once.  A device that holds the lancet (lancing device).  A journal or log book to write down your results. Procedure  Wash your hands with soap and water.  Prick  the side of your finger (not the tip) with the lancet. Use a different finger each time.  Gently rub the finger until a small drop of blood appears.  Follow instructions that come with your meter for inserting the test strip, applying blood to the strip, and using your blood glucose meter.  Write down your result and any notes. Alternative testing sites  Some meters allow you to use areas of your body other than your finger (alternative sites) to test your blood.  If you think you may have hypoglycemia,  or if you have hypoglycemia unawareness, do not use alternative sites. Use your finger instead.  Alternative sites may not be as accurate as the fingers, because blood flow is slower in these areas. This means that the result you get may be delayed, and it may be different from the result that you would get from your finger.  The most common alternative sites are: ? Forearm. ? Thigh. ? Palm of the hand. Additional tips  Always keep your supplies with you.  If you have questions or need help, all blood glucose meters have a 24-hour "hotline" number that you can call. You may also contact your health care provider.  After you use a few boxes of test strips, adjust (calibrate) your blood glucose meter by following instructions that came with your meter. This information is not intended to replace advice given to you by your health care provider. Make sure you discuss any questions you have with your health care provider. Document Released: 03/30/2003 Document Revised: 10/15/2015 Document Reviewed: 09/06/2015 Elsevier Interactive Patient Education  2017 [employer].

## 2016-10-09 NOTE — Progress Notes (Signed)
Subjective:    Patient ID: [name], female    DOB: 1945-07-31, 71 y.o.   MRN: [id number] Chief Complaint  Patient presents with  . diabetes    diabetes. insurance won't pay for jardiance or test strips.(pt will call insurance to let us know what is cover) . pt has not been checking sugars and has stopped taking 2 meds due to side effects. she has increased her metoprolol twice a day.  having trouble sleeping and wants something for sleep   [name] is a 71 y.o. female who presents for follow-up of Type 2 diabetes mellitus and other chronic health conditions. Questioned why she has not followed up with me and she states this is due to her husband having  a stroke and other health conditions and she has been having to take care of him.    Her last A1c was 12.4% in November 2017.  Weight gain 10 lbs in 7 months.  States she has been out of test strips but did not let us know.  States she has not been taking Jardiance and stopped in February due to the expense but did not let us know this either.  Has only been taking Metformin 500 mg twice daily. Does not skip doses.   States she sopped taking lisinopril because she thought it made her cough. She did not let me know.   States she has been "doubling up" on her metoprolol and made this decision on her own.   States she stopped her atorvastatin because she thought it made her dizzy. Dizziness went away shortly after stopping it.    Patient is not checking home blood sugars.   Home blood sugar records: patient does not check sugars How often is blood sugars being checked: none Current symptoms include: none. Patient denies foot ulcerations, nausea, paresthesia of the feet, polydipsia, polyuria, visual disturbances, vomiting and weight loss.  Patient is checking their feet daily. Any Foot concerns (callous, ulcer, wound, thickened nails, toenail fungus, skin fungus, hammer toe): none Last dilated eye exam: unknown- years ago.    Current treatments: reports good compliance with Metformin bid but not with any other medication. no exercise. reports fairly healthy diet. has gained weight since her visit in November 2017. . Medication compliance: poor  Current diet: cut back on carbs and sugars.  Current exercise: none Known diabetic complications: none  The following portions of the patient's history were reviewed and updated as appropriate: allergies, current medications, past medical history, past social history and problem list.  ROS as in subjective above.     Objective:    Physical Exam Alert and in no distress otherwise not examined.  Blood pressure 120/70, pulse 68, weight 202 lb 6.4 oz (91.8 kg).  Lab Review Diabetic Labs Latest Ref Rng & Units 10/09/2016 02/11/2016 10/06/2008 08/11/2008 11/14/2007  HbA1c - 10.1% 12.4% 8.0(H) 10.6(H) 7.8 (NOTE)   The ADA recommends the following therapeutic goal for glycemic   control related to Hgb A1C measurement:   Goal of Therapy:   < 7.0% Hgb A1C   Reference: American Diabetes Association: Clinical Practice   Recommendations 2008, Diabetes Care,  2008, 31:(Suppl 1).(H)  Microalbumin Not estab mg/dL - 3.6 - - -  Micro/Creat Ratio <30 mcg/mg creat - 27 - - -  Chol 125 - 200 mg/dL - 214(H) - 143 117        ATP III CLASSIFICATION:  <200     mg/dL   Desirable  200-239  mg/dL  Borderline High  >=240    mg/dL   High  HDL >=46 mg/dL - 40(L) - 35.90(L) 30(L)  Calc LDL <130 mg/dL - 139(H) - 75 61        Total Cholesterol/HDL:CHD Risk Coronary Heart Disease Risk Table                     Men   Women  1/2 Average Risk   3.4   3.3  Triglycerides <150 mg/dL - 177(H) - 163.0(H) 130  Creatinine 0.50 - 0.99 mg/dL - 0.63 - 0.7 0.68   BP/Weight 10/09/2016 03/20/2016 03/19/2016 03/17/2016 28/41/3244  Systolic BP 010 272 - - 536  Diastolic BP 70 74 - - 90  Wt. (Lbs) 202.4 - 185 185 194  BMI 33.68 - 30.79 30.79 32.28   No flowsheet data found.  [name]  reports that she has never  smoked. She has never used smokeless tobacco. She reports that she does not drink alcohol or use drugs.     Assessment & Plan:    Uncontrolled type 2 diabetes mellitus without complication, without long-term current use of insulin ([place]) - Plan: HgB A1c, CBC with Differential/Platelet, COMPLETE METABOLIC PANEL WITH GFR, TSH, Lipid panel, metFORMIN (GLUCOPHAGE) 1000 MG tablet, glipiZIDE (GLUCOTROL) 5 MG tablet  Essential hypertension - Plan: CBC with Differential/Platelet, COMPLETE METABOLIC PANEL WITH GFR  Mixed hyperlipidemia - Plan: Lipid panel  Personal history of noncompliance with medical treatment, presenting hazards to health  Obesity (BMI 30.0-34.9) - Plan: CBC with Differential/Platelet, COMPLETE METABOLIC PANEL WITH GFR, TSH, Lipid panel  1. Rx changes: increase Metformin to 1,000 mg twice daily. add glipizide daily before breakfast.   Hemoglobin A1c is 10.1% and uncontrolled.  2. Education: Reviewed 'ABCs' of diabetes management (respective goals in parentheses):  A1C (<7), blood pressure (<130/80), and cholesterol (LDL <100). 3. Compliance at present is estimated to be poor. Efforts to improve compliance (if necessary) will be directed at dietary modifications: cut back on carbohydrates, sugar and watch portion sizes. , increased exercise and regular blood sugar monitoring: two times daily. 4. Follow up: 1 month  5. Needs dilated eye exam. 6. Discussed serious potential health risks of poor medication compliance and making decisions to self medicate without communicating her decisions to me. Discussed that her keeping information from me in regards to her medications and symptoms makes it difficulty for me to care for her. She verbalizes understanding and states she plans to follow recommendations.

## 2016-10-12 ENCOUNTER — Other Ambulatory Visit: Payer: Self-pay | Admitting: Family Medicine

## 2016-10-12 MED ORDER — PRAVASTATIN SODIUM 20 MG PO TABS: 20.0000 mg | ORAL_TABLET | Freq: Every day | ORAL | 1 refills | Status: DC

## 2016-10-12 NOTE — Progress Notes (Signed)
   Subjective:    Patient ID: [name], female    DOB: 06-16-1945, 71 y.o.   MRN: [id number]  HPI    Review of Systems     Objective:   Physical Exam        Assessment & Plan:

## 2016-11-13 ENCOUNTER — Encounter: Payer: Self-pay | Admitting: Family Medicine

## 2016-11-13 ENCOUNTER — Ambulatory Visit (INDEPENDENT_AMBULATORY_CARE_PROVIDER_SITE_OTHER): Payer: Medicare HMO | Admitting: Family Medicine

## 2016-11-13 VITALS — BP 130/82 | HR 90 | Ht 65.0 in | Wt 201.2 lb

## 2016-11-13 DIAGNOSIS — E1165 Type 2 diabetes mellitus with hyperglycemia: Secondary | ICD-10-CM | POA: Diagnosis not present

## 2016-11-13 DIAGNOSIS — E782 Mixed hyperlipidemia: Secondary | ICD-10-CM | POA: Diagnosis not present

## 2016-11-13 DIAGNOSIS — I10 Essential (primary) hypertension: Secondary | ICD-10-CM | POA: Diagnosis not present

## 2016-11-13 DIAGNOSIS — IMO0001 Reserved for inherently not codable concepts without codable children: Secondary | ICD-10-CM

## 2016-11-13 DIAGNOSIS — G4700 Insomnia, unspecified: Secondary | ICD-10-CM | POA: Diagnosis not present

## 2016-11-13 MED ORDER — OLMESARTAN MEDOXOMIL 5 MG PO TABS: 5.0000 mg | ORAL_TABLET | Freq: Every day | ORAL | 2 refills | Status: DC

## 2016-11-13 NOTE — Progress Notes (Signed)
   Subjective:    Patient ID: [name], female    DOB: [birth year], 71 y.o.   MRN: [id number]  HPI Chief Complaint  Patient presents with  . 4 week follow-up    1 month follow-up DM-  lowest in am 140, highest-212 / lowest after lunch 79- highest- 237   She is here for a 1 month follow up on uncontrolled diabetes. In July her hemoglobin A1c was 10.1% and prior to that her A1c was 12.4% in November 2017. We increased her Metformin to 1,000 mg twice daily and added glipizide once daily. She had poor compliance in regards to medication adherence and follow up appointments.  She had reportedly stopped her lisinopril and atorvastatin. She stopped Jardiance due to expense. Has not been interested in insulin or GLP-1.   States she has been checking her BS at home and her readings have been between 140-212 and 2 hours after lunch readings have been 79-237. She brought her monitor in also.  States she has been eating fewer carbohydrates and sugar.  Is not exercising.   Started taking pravastatin 5 weeks ago and no issues.  She is not fasting today.   She has a new complaint today of not being able to go to sleep and sleep through the night. Has not tried any medication in the past. Requests to try something "mild".   Denies fever, chills, fatigue, unexplained weight loss, increased thirst or hunger.  Denies dizziness, chest pain, shortness of breath, N/V/D, abdominal pain.   Reviewed allergies, medications, past medical, surgical, and social history.   Review of Systems Pertinent positives and negatives in the history of present illness.     Objective:   Physical Exam BP 130/82   Pulse 90   Ht 5\' 5"  (1.651 m)   Wt 201 lb 3.2 oz (91.3 kg)   BMI 33.48 kg/m   Alert and oriented and in no acute distress. Not otherwise examined.      Assessment & Plan:  Uncontrolled type 2 diabetes mellitus without complication, without long-term current use of insulin ([facility])  Essential  hypertension  Mixed hyperlipidemia  Insomnia, unspecified type  She appears to be more interested in treating her diabetes and taking better care of herself. Blood sugars are not yet in goal range but have improved.  She will continue on current medications for now. Counseled on healthy diet and increasing physical activity. She will continue checking BS twice daily and return in 2 months for diabetes check and repeat A1c.  She appears to be doing well on pravastatin even though she did not tolerate atorvastatin in the past. Is not fasting and would like to repeat lipids at her next appointment.  Apparently she had a cough related to lisinopril. Will start her on an Arb for renal protection.  Counseled on good sleep hygiene and samples of melatonin given.  She will follow up in 2 months or sooner if needed.

## 2016-11-13 NOTE — Patient Instructions (Signed)
Try melatonin. Make sure your bedroom is cool and dark. Avoid any alcohol before bedtime.  If you lay awake longer than 30 minutes then get up and go back to bed when you are ready to go to sleep.    Insomnia Insomnia is a sleep disorder that makes it difficult to fall asleep or to stay asleep. Insomnia can cause tiredness (fatigue), low energy, difficulty concentrating, mood swings, and poor performance at work or school. There are three different ways to classify insomnia:  Difficulty falling asleep.  Difficulty staying asleep.  Waking up too early in the morning.  Any type of insomnia can be long-term (chronic) or short-term (acute). Both are common. Short-term insomnia usually lasts for three months or less. Chronic insomnia occurs at least three times a week for longer than three months. What are the causes? Insomnia may be caused by another condition, situation, or substance, such as:  Anxiety.  Certain medicines.  Gastroesophageal reflux disease (GERD) or other gastrointestinal conditions.  Asthma or other breathing conditions.  Restless legs syndrome, sleep apnea, or other sleep disorders.  Chronic pain.  Menopause. This may include hot flashes.  Stroke.  Abuse of alcohol, tobacco, or illegal drugs.  Depression.  Caffeine.  Neurological disorders, such as Alzheimer disease.  An overactive thyroid (hyperthyroidism).  The cause of insomnia may not be known. What increases the risk? Risk factors for insomnia include:  Gender. Women are more commonly affected than men.  Age. Insomnia is more common as you get older.  Stress. This may involve your professional or personal life.  Income. Insomnia is more common in people with lower income.  Lack of exercise.  Irregular work schedule or night shifts.  Traveling between different time zones.  What are the signs or symptoms? If you have insomnia, trouble falling asleep or trouble staying asleep is the  main symptom. This may lead to other symptoms, such as:  Feeling fatigued.  Feeling nervous about going to sleep.  Not feeling rested in the morning.  Having trouble concentrating.  Feeling irritable, anxious, or depressed.  How is this treated? Treatment for insomnia depends on the cause. If your insomnia is caused by an underlying condition, treatment will focus on addressing the condition. Treatment may also include:  Medicines to help you sleep.  Counseling or therapy.  Lifestyle adjustments.  Follow these instructions at home:  Take medicines only as directed by your health care provider.  Keep regular sleeping and waking hours. Avoid naps.  Keep a sleep diary to help you and your health care provider figure out what could be causing your insomnia. Include: ? When you sleep. ? When you wake up during the night. ? How well you sleep. ? How rested you feel the next day. ? Any side effects of medicines you are taking. ? What you eat and drink.  Make your bedroom a comfortable place where it is easy to fall asleep: ? Put up shades or special blackout curtains to block light from outside. ? Use a white noise machine to block noise. ? Keep the temperature cool.  Exercise regularly as directed by your health care provider. Avoid exercising right before bedtime.  Use relaxation techniques to manage stress. Ask your health care provider to suggest some techniques that may work well for you. These may include: ? Breathing exercises. ? Routines to release muscle tension. ? Visualizing peaceful scenes.  Cut back on alcohol, caffeinated beverages, and cigarettes, especially close to bedtime. These can disrupt your sleep.  Do not overeat or eat spicy foods right before bedtime. This can lead to digestive discomfort that can make it hard for you to sleep.  Limit screen use before bedtime. This includes: ? Watching TV. ? Using your smartphone, tablet, and computer.  Stick  to a routine. This can help you fall asleep faster. Try to do a quiet activity, brush your teeth, and go to bed at the same time each night.  Get out of bed if you are still awake after 15 minutes of trying to sleep. Keep the lights down, but try reading or doing a quiet activity. When you feel sleepy, go back to bed.  Make sure that you drive carefully. Avoid driving if you feel very sleepy.  Keep all follow-up appointments as directed by your health care provider. This is important. Contact a health care provider if:  You are tired throughout the day or have trouble in your daily routine due to sleepiness.  You continue to have sleep problems or your sleep problems get worse. Get help right away if:  You have serious thoughts about hurting yourself or someone else. This information is not intended to replace advice given to you by your health care provider. Make sure you discuss any questions you have with your health care provider. Document Released: 03/24/2000 Document Revised: 08/27/2015 Document Reviewed: 12/26/2013 Elsevier Interactive Patient Education  [employer].

## 2016-11-22 ENCOUNTER — Telehealth: Payer: Self-pay | Admitting: Family Medicine

## 2016-11-22 NOTE — Telephone Encounter (Signed)
Not a patient of Dr. Sarajane Jews anymore.

## 2016-12-02 ENCOUNTER — Other Ambulatory Visit: Payer: Self-pay | Admitting: Family Medicine

## 2016-12-02 DIAGNOSIS — IMO0001 Reserved for inherently not codable concepts without codable children: Secondary | ICD-10-CM

## 2016-12-02 DIAGNOSIS — E1165 Type 2 diabetes mellitus with hyperglycemia: Principal | ICD-10-CM

## 2016-12-08 ENCOUNTER — Other Ambulatory Visit: Payer: Self-pay | Admitting: Family Medicine

## 2016-12-08 DIAGNOSIS — E1165 Type 2 diabetes mellitus with hyperglycemia: Principal | ICD-10-CM

## 2016-12-08 DIAGNOSIS — IMO0001 Reserved for inherently not codable concepts without codable children: Secondary | ICD-10-CM

## 2016-12-14 ENCOUNTER — Telehealth: Payer: Self-pay | Admitting: Family Medicine

## 2016-12-14 NOTE — Telephone Encounter (Signed)
Called pharmacy and they states pt got a refill back on 7/2 for 3 month supply. Pt states that they must have shorted her 30 days. Per pharmacy, they can run it through on 9/15 again and pt was notified of that. We do not have samples

## 2016-12-14 NOTE — Telephone Encounter (Signed)
Pt called requesting that [place] call her about her Metformin. She is attempting to refill the med but the pharmacy will not give her a refill until 10/2 because they said she is not due for the med until that date but pt said she is out of medication. She wants to know if [place] and Loletha Carrow can help her get a refill early.

## 2017-01-05 ENCOUNTER — Other Ambulatory Visit: Payer: Self-pay | Admitting: Family Medicine

## 2017-01-05 DIAGNOSIS — I10 Essential (primary) hypertension: Secondary | ICD-10-CM

## 2017-01-11 ENCOUNTER — Ambulatory Visit: Payer: Medicare HMO | Admitting: Family Medicine

## 2017-02-04 NOTE — Progress Notes (Signed)
Subjective:    Patient ID: [name], female    DOB: 11/27/1945, 71 y.o.   MRN: [id number]  [name] is a 71 y.o. female who presents for follow-up of Type 2 diabetes mellitus and other chronic health conditions. She also reports that she is long overdue for some health maintenance tests including mammogram and bone density. Denies ever having these tests and would like to have them ordered.   Patient is checking home blood sugars.   Home blood sugar records: BGs range between 139 and 155 How often is blood sugars being checked: twice a day Current symptoms include: none. Patient denies nausea, visual disturbances, vomiting and weight loss.  Patient is checking their feet daily. Any Foot concerns (callous, ulcer, wound, thickened nails, toenail fungus, skin fungus, hammer toe): none Last dilated eye exam: [place] in [place] 2018 [place].   Current treatments: metformin and glipizide.   Medication compliance: fair  Current diet: in general, a "healthy" diet   Current exercise: walking Known diabetic complications: none  The following portions of the patient's history were reviewed and updated as appropriate: allergies, current medications, past medical history, past social history and problem list.  ROS as in subjective above.     Objective:    Physical Exam Alert and in no distress. Diabetic foot exam done and normal.   Blood pressure 120/70, pulse 67, weight 203 lb 12.8 oz (92.4 kg).  Lab Review Diabetic Labs Latest Ref Rng & Units 02/05/2017 10/09/2016 02/11/2016 10/06/2008 08/11/2008  HbA1c - 7.3% 10.1% 12.4% 8.0(H) 10.6(H)  Microalbumin Not estab mg/dL - - 3.6 - -  Micro/Creat Ratio <30 mcg/mg creat - - 27 - -  Chol <200 mg/dL - 205(H) 214(H) - 143  HDL >50 mg/dL - 41(L) 40(L) - 35.90(L)  Calc LDL <100 mg/dL - 124(H) 139(H) - 75  Triglycerides <150 mg/dL - 198(H) 177(H) - 163.0(H)  Creatinine 0.50 - 0.99 mg/dL - 0.57 0.63 - 0.7   BP/Weight 02/05/2017  11/13/2016 10/09/2016 03/20/2016 75/64/3329  Systolic BP 518 841 660 630 -  Diastolic BP 70 82 70 74 -  Wt. (Lbs) 203.8 201.2 202.4 - 185  BMI 33.91 33.48 33.68 - 30.79   Foot/eye exam completion dates 02/05/2017  Foot Form Completion Done    [name]  reports that she has never smoked. She has never used smokeless tobacco. She reports that she does not drink alcohol or use drugs.     Assessment & Plan:    Uncontrolled diabetes mellitus type 2 without complications ([place]) - Plan: HgB A1c, Pneumococcal conjugate vaccine 13-valent, Microalbumin / creatinine urine ratio, CANCELED: Microalbumin / creatinine urine ratio  Essential hypertension  Screening for breast cancer - Plan: MM DIGITAL SCREENING BILATERAL  Estrogen deficiency - Plan: DG Bone Density  Vitamin D deficiency - Plan: VITAMIN D 25 Hydroxy (Vit-D Deficiency, Fractures)  Vaccine counseling - Plan: Pneumococcal conjugate vaccine 13-valent  Need for prophylactic vaccination against Streptococcus pneumoniae (pneumococcus) - Plan: Pneumococcal conjugate vaccine 13-valent  Hyperlipidemia associated with type 2 diabetes mellitus ([place]) - Plan: Lipid panel  Medication management - Plan: Lipid panel, VITAMIN D 25 Hydroxy (Vit-D Deficiency, Fractures)  Need for hepatitis C screening test - Plan: Hepatitis C antibody  1. Rx changes: none Hgb A1c improved to 7.3% on current medications and no low blood sugars. 2. Education: Reviewed 'ABCs' of diabetes management (respective goals in parentheses):  A1C (<7), blood pressure (<130/80), and cholesterol (LDL <100). 3. Compliance at present is estimated  to be fair. Efforts to improve compliance (if necessary) will be directed at dietary modifications: cut back on sugar and carbohydrates, increased exercise and regular blood sugar monitoring: daily. 4. Is scheduled for diabetic eye exam in November.  5. Foot exam done and normal.  6. HTN- within goal. Continue on current medication.   7. Hyperlipidemia- check fasting lipids. Continue statin therapy. No concerns with this medication. Continue pravastatin and also eat low cholesterol diet.  8. Prevnar 13 given. Counseling done.  9. Will check vitamin D due to history of vitamin D deficiency. She is not currently taking a supplement.  10. Mammogram and bone density ordered per patient request. She will call to schedule these.  11. One time hepatitis C test ordered per guidelines.  12. Follow up: 4 months

## 2017-02-05 ENCOUNTER — Ambulatory Visit (INDEPENDENT_AMBULATORY_CARE_PROVIDER_SITE_OTHER): Payer: Medicare HMO | Admitting: Family Medicine

## 2017-02-05 ENCOUNTER — Encounter: Payer: Self-pay | Admitting: Family Medicine

## 2017-02-05 VITALS — BP 120/70 | HR 67 | Wt 203.8 lb

## 2017-02-05 DIAGNOSIS — Z1239 Encounter for other screening for malignant neoplasm of breast: Secondary | ICD-10-CM | POA: Insufficient documentation

## 2017-02-05 DIAGNOSIS — E559 Vitamin D deficiency, unspecified: Secondary | ICD-10-CM | POA: Insufficient documentation

## 2017-02-05 DIAGNOSIS — Z1159 Encounter for screening for other viral diseases: Secondary | ICD-10-CM | POA: Diagnosis not present

## 2017-02-05 DIAGNOSIS — Z79899 Other long term (current) drug therapy: Secondary | ICD-10-CM | POA: Diagnosis not present

## 2017-02-05 DIAGNOSIS — E785 Hyperlipidemia, unspecified: Secondary | ICD-10-CM

## 2017-02-05 DIAGNOSIS — Z7189 Other specified counseling: Secondary | ICD-10-CM | POA: Diagnosis not present

## 2017-02-05 DIAGNOSIS — Z23 Encounter for immunization: Secondary | ICD-10-CM | POA: Diagnosis not present

## 2017-02-05 DIAGNOSIS — Z7185 Encounter for immunization safety counseling: Secondary | ICD-10-CM

## 2017-02-05 DIAGNOSIS — E2839 Other primary ovarian failure: Secondary | ICD-10-CM | POA: Insufficient documentation

## 2017-02-05 DIAGNOSIS — Z1231 Encounter for screening mammogram for malignant neoplasm of breast: Secondary | ICD-10-CM | POA: Diagnosis not present

## 2017-02-05 DIAGNOSIS — I10 Essential (primary) hypertension: Secondary | ICD-10-CM | POA: Diagnosis not present

## 2017-02-05 DIAGNOSIS — E1169 Type 2 diabetes mellitus with other specified complication: Secondary | ICD-10-CM | POA: Diagnosis not present

## 2017-02-05 DIAGNOSIS — E1165 Type 2 diabetes mellitus with hyperglycemia: Secondary | ICD-10-CM | POA: Diagnosis not present

## 2017-02-05 DIAGNOSIS — IMO0001 Reserved for inherently not codable concepts without codable children: Secondary | ICD-10-CM

## 2017-02-05 HISTORY — DX: Encounter for other screening for malignant neoplasm of breast: Z12.39

## 2017-02-05 LAB — POCT GLYCOSYLATED HEMOGLOBIN (HGB A1C): Hemoglobin A1C: 7.3

## 2017-02-05 NOTE — Patient Instructions (Addendum)
Your hemoglobin A1c is 7.3% today. This is at goal. Continue checking your blood sugars once daily and taking your medication. Eat healthy and increase your waking each day.   Call and schedule your mammogram and bone density.   Get your eye exam and have them send [place] your results.   Return in 3-4 months or sooner if needed

## 2017-02-06 LAB — LIPID PANEL
CHOL/HDL RATIO: 3.8 (calc) (ref <5.0)
Cholesterol: 160 mg/dL (ref <200)
HDL: 42 mg/dL — AB (ref >50)
LDL Cholesterol (Calc): 92 mg/dL (calc)
NON-HDL CHOLESTEROL (CALC): 118 mg/dL (ref <130)
TRIGLYCERIDES: 157 mg/dL — AB (ref <150)

## 2017-02-06 LAB — VITAMIN D 25 HYDROXY (VIT D DEFICIENCY, FRACTURES): VIT D 25 HYDROXY: 26 ng/mL — AB (ref 30–100)

## 2017-02-06 LAB — HEPATITIS C ANTIBODY
Hepatitis C Ab: NONREACTIVE
SIGNAL TO CUT-OFF: 0.01 (ref <1.00)

## 2017-02-15 ENCOUNTER — Telehealth: Payer: Self-pay | Admitting: Family Medicine

## 2017-02-15 NOTE — Telephone Encounter (Signed)
Received requested colonoscopy from [facility]. Sending back for review.

## 2017-02-16 ENCOUNTER — Other Ambulatory Visit: Payer: Self-pay | Admitting: Family Medicine

## 2017-02-16 ENCOUNTER — Encounter: Payer: Self-pay | Admitting: Family Medicine

## 2017-02-16 DIAGNOSIS — E1165 Type 2 diabetes mellitus with hyperglycemia: Principal | ICD-10-CM

## 2017-02-16 DIAGNOSIS — IMO0001 Reserved for inherently not codable concepts without codable children: Secondary | ICD-10-CM

## 2017-02-21 ENCOUNTER — Other Ambulatory Visit: Payer: Self-pay | Admitting: Family Medicine

## 2017-02-21 DIAGNOSIS — E1165 Type 2 diabetes mellitus with hyperglycemia: Principal | ICD-10-CM

## 2017-02-21 DIAGNOSIS — IMO0001 Reserved for inherently not codable concepts without codable children: Secondary | ICD-10-CM

## 2017-03-03 DIAGNOSIS — E119 Type 2 diabetes mellitus without complications: Secondary | ICD-10-CM | POA: Diagnosis not present

## 2017-03-03 DIAGNOSIS — Z0100 Encounter for examination of eyes and vision without abnormal findings: Secondary | ICD-10-CM | POA: Diagnosis not present

## 2017-03-03 LAB — HM DIABETES EYE EXAM

## 2017-03-17 ENCOUNTER — Other Ambulatory Visit: Payer: Self-pay | Admitting: Family Medicine

## 2017-03-22 ENCOUNTER — Other Ambulatory Visit: Payer: Self-pay | Admitting: Family Medicine

## 2017-04-12 ENCOUNTER — Telehealth: Payer: Self-pay

## 2017-04-12 ENCOUNTER — Ambulatory Visit
Admission: RE | Admit: 2017-04-12 | Discharge: 2017-04-12 | Disposition: A | Discharge status: Home / Self Care | Payer: Commercial Managed Care - HMO | Source: Ambulatory Visit | Attending: Family Medicine | Admitting: Family Medicine

## 2017-04-12 DIAGNOSIS — Z780 Asymptomatic menopausal state: Secondary | ICD-10-CM | POA: Diagnosis not present

## 2017-04-12 DIAGNOSIS — Z1239 Encounter for other screening for malignant neoplasm of breast: Secondary | ICD-10-CM

## 2017-04-12 DIAGNOSIS — M810 Age-related osteoporosis without current pathological fracture: Secondary | ICD-10-CM | POA: Diagnosis not present

## 2017-04-12 DIAGNOSIS — Z1231 Encounter for screening mammogram for malignant neoplasm of breast: Secondary | ICD-10-CM | POA: Diagnosis not present

## 2017-04-12 DIAGNOSIS — E2839 Other primary ovarian failure: Secondary | ICD-10-CM

## 2017-04-12 NOTE — Telephone Encounter (Signed)
Called and notified patient of note, patient had no questions.

## 2017-04-12 NOTE — Telephone Encounter (Signed)
Called patient and advised of the results, patient would like to know if she needs to come in for an appointment or if she can keep the one she has with you already on Jan 30th?  Thanks!

## 2017-04-12 NOTE — Telephone Encounter (Signed)
-----   Message from Girtha Rm, NP-C sent at 04/12/2017  3:46 PM EST ----- Please call and have her come in for an office visit to discuss bone density results.

## 2017-04-12 NOTE — Telephone Encounter (Signed)
-----   Message from Girtha Rm, NP-C sent at 04/12/2017  4:28 PM EST ----- Ok to keep the one she has

## 2017-04-13 ENCOUNTER — Other Ambulatory Visit: Payer: Self-pay | Admitting: Family Medicine

## 2017-04-13 DIAGNOSIS — R928 Other abnormal and inconclusive findings on diagnostic imaging of breast: Secondary | ICD-10-CM

## 2017-04-18 ENCOUNTER — Ambulatory Visit
Admission: RE | Admit: 2017-04-18 | Discharge: 2017-04-18 | Disposition: A | Discharge status: Home / Self Care | Payer: Commercial Managed Care - HMO | Source: Ambulatory Visit | Attending: Family Medicine | Admitting: Family Medicine

## 2017-04-18 ENCOUNTER — Other Ambulatory Visit: Payer: Self-pay | Admitting: Family Medicine

## 2017-04-18 DIAGNOSIS — R921 Mammographic calcification found on diagnostic imaging of breast: Secondary | ICD-10-CM | POA: Diagnosis not present

## 2017-04-18 DIAGNOSIS — R928 Other abnormal and inconclusive findings on diagnostic imaging of breast: Secondary | ICD-10-CM

## 2017-04-19 ENCOUNTER — Encounter: Payer: Self-pay | Admitting: Family Medicine

## 2017-04-24 ENCOUNTER — Ambulatory Visit
Admission: RE | Admit: 2017-04-24 | Discharge: 2017-04-24 | Disposition: A | Discharge status: Home / Self Care | Payer: Commercial Managed Care - HMO | Source: Ambulatory Visit | Attending: Family Medicine | Admitting: Family Medicine

## 2017-04-24 DIAGNOSIS — R921 Mammographic calcification found on diagnostic imaging of breast: Secondary | ICD-10-CM

## 2017-04-24 DIAGNOSIS — D0512 Intraductal carcinoma in situ of left breast: Secondary | ICD-10-CM | POA: Diagnosis not present

## 2017-04-24 DIAGNOSIS — N6489 Other specified disorders of breast: Secondary | ICD-10-CM | POA: Diagnosis not present

## 2017-04-24 HISTORY — PX: BREAST BIOPSY: SHX20

## 2017-04-25 ENCOUNTER — Other Ambulatory Visit: Payer: Self-pay | Admitting: Family Medicine

## 2017-04-25 ENCOUNTER — Ambulatory Visit
Admission: RE | Admit: 2017-04-25 | Discharge: 2017-04-25 | Disposition: A | Discharge status: Home / Self Care | Payer: Commercial Managed Care - HMO | Source: Ambulatory Visit | Attending: Family Medicine | Admitting: Family Medicine

## 2017-04-25 DIAGNOSIS — R921 Mammographic calcification found on diagnostic imaging of breast: Secondary | ICD-10-CM

## 2017-04-25 DIAGNOSIS — N6012 Diffuse cystic mastopathy of left breast: Secondary | ICD-10-CM | POA: Diagnosis not present

## 2017-04-25 HISTORY — PX: BREAST BIOPSY: SHX20

## 2017-04-26 ENCOUNTER — Telehealth: Payer: Self-pay | Admitting: Hematology and Oncology

## 2017-04-26 NOTE — Telephone Encounter (Signed)
Spoke with patient to confirm morning BC on 05/02/17, packet given to patient by [facility]

## 2017-04-27 ENCOUNTER — Other Ambulatory Visit: Payer: Self-pay | Admitting: *Deleted

## 2017-04-27 DIAGNOSIS — Z86000 Personal history of in-situ neoplasm of breast: Secondary | ICD-10-CM | POA: Insufficient documentation

## 2017-04-27 DIAGNOSIS — D0512 Intraductal carcinoma in situ of left breast: Secondary | ICD-10-CM | POA: Insufficient documentation

## 2017-05-02 ENCOUNTER — Ambulatory Visit
Admission: RE | Admit: 2017-05-02 | Discharge: 2017-05-02 | Disposition: A | Discharge status: Home / Self Care | Payer: Commercial Managed Care - HMO | Source: Ambulatory Visit | Attending: Radiation Oncology | Admitting: Radiation Oncology

## 2017-05-02 ENCOUNTER — Inpatient Hospital Stay: Payer: Commercial Managed Care - HMO | Attending: Hematology and Oncology | Admitting: Hematology and Oncology

## 2017-05-02 ENCOUNTER — Ambulatory Visit: Payer: Commercial Managed Care - HMO | Admitting: Physical Therapy

## 2017-05-02 ENCOUNTER — Ambulatory Visit: Payer: Self-pay | Admitting: Surgery

## 2017-05-02 ENCOUNTER — Encounter: Payer: Self-pay | Admitting: General Practice

## 2017-05-02 ENCOUNTER — Encounter: Payer: Self-pay | Admitting: Radiation Oncology

## 2017-05-02 ENCOUNTER — Inpatient Hospital Stay: Payer: Commercial Managed Care - HMO

## 2017-05-02 ENCOUNTER — Encounter: Payer: Self-pay | Admitting: Hematology and Oncology

## 2017-05-02 DIAGNOSIS — D0512 Intraductal carcinoma in situ of left breast: Secondary | ICD-10-CM

## 2017-05-02 DIAGNOSIS — C50412 Malignant neoplasm of upper-outer quadrant of left female breast: Secondary | ICD-10-CM

## 2017-05-02 DIAGNOSIS — Z170 Estrogen receptor positive status [ER+]: Secondary | ICD-10-CM | POA: Diagnosis not present

## 2017-05-02 LAB — CBC WITH DIFFERENTIAL (CANCER CENTER ONLY)
Basophils Absolute: 0.1 10*3/uL (ref 0.0–0.1)
Basophils Relative: 1 %
Eosinophils Absolute: 0.4 10*3/uL (ref 0.0–0.5)
Eosinophils Relative: 4 %
HEMATOCRIT: 39.1 % (ref 34.8–46.6)
HEMOGLOBIN: 12.9 g/dL (ref 11.6–15.9)
LYMPHS ABS: 2.2 10*3/uL (ref 0.9–3.3)
LYMPHS PCT: 20 %
MCH: 27.1 pg (ref 25.1–34.0)
MCHC: 33 g/dL (ref 31.5–36.0)
MCV: 82.1 fL (ref 79.5–101.0)
MONOS PCT: 9 %
Monocytes Absolute: 1 10*3/uL — ABNORMAL HIGH (ref 0.1–0.9)
NEUTROS ABS: 7.4 10*3/uL — AB (ref 1.5–6.5)
NEUTROS PCT: 66 %
Platelet Count: 324 10*3/uL (ref 145–400)
RBC: 4.76 MIL/uL (ref 3.70–5.45)
RDW: 14.2 % (ref 11.2–16.1)
WBC Count: 11 10*3/uL — ABNORMAL HIGH (ref 3.9–10.3)

## 2017-05-02 LAB — CMP (CANCER CENTER ONLY)
ALT: 25 U/L (ref 0–55)
ANION GAP: 9 (ref 3–11)
AST: 23 U/L (ref 5–34)
Albumin: 3.7 g/dL (ref 3.5–5.0)
Alkaline Phosphatase: 62 U/L (ref 40–150)
BUN: 13 mg/dL (ref 7–26)
CO2: 26 mmol/L (ref 22–29)
Calcium: 9.3 mg/dL (ref 8.4–10.4)
Chloride: 105 mmol/L (ref 98–109)
Creatinine: 0.75 mg/dL (ref 0.60–1.10)
GFR, Estimated: >60 mL/min (ref >60)
Glucose, Bld: 158 mg/dL — ABNORMAL HIGH (ref 70–140)
POTASSIUM: 4.3 mmol/L (ref 3.3–4.7)
SODIUM: 140 mmol/L (ref 136–145)
Total Bilirubin: 0.5 mg/dL (ref 0.2–1.2)
Total Protein: 6.9 g/dL (ref 6.4–8.3)

## 2017-05-02 NOTE — Assessment & Plan Note (Signed)
04/24/2017: Screening detected left breast calcifications in 3 areas UOQ, anterior group of calcifications 1.4 cm biopsy revealed high-grade DCIS with calcifications ER 95%, PR 95%, other 2 areas benign fibrocystic changes, Tis N0 stage 0  Pathology review: I discussed with the patient the difference between DCIS and invasive breast cancer. It is considered a precancerous lesion. DCIS is classified as a 0. It is generally detected through mammograms as calcifications. We discussed the significance of grades and its impact on prognosis. We also discussed the importance of ER and PR receptors and their implications to adjuvant treatment options. Prognosis of DCIS dependence on grade, comedo necrosis. It is anticipated that if not treated, 20-30% of DCIS can develop into invasive breast cancer.  Recommendation: 1. Breast conserving surgery 2. Followed by adjuvant radiation therapy 3. Followed by antiestrogen therapy with tamoxifen 5 years  Tamoxifen counseling: We discussed the risks and benefits of tamoxifen. These include but not limited to insomnia, hot flashes, mood changes, vaginal dryness, and weight gain. Although rare, serious side effects including endometrial cancer, risk of blood clots were also discussed. We strongly believe that the benefits far outweigh the risks. Patient understands these risks and consented to starting treatment. Planned treatment duration is 5 years.  Return to clinic after surgery to discuss the final pathology report and come up with an adjuvant treatment plan.

## 2017-05-02 NOTE — H&P (View-Only) (Signed)
[name] Documented: 05/02/2017 7:49 AM Location: [facility] Patient #: [id number] DOB: 09/19/1945 Undefined / Language: [name] / Race: White Female  History of Present Illness [name] A. Neylan Koroma MD; 05/02/2017 10:33 AM) Patient words: Patient sent at the request of Dr. Isidore Moos for abnormal screening mammogram on the left. A 1.4 cm collection of left breast microcalcifications in the upper outer quadrant and 2 other adjacent groups of calcifications are localized. The 1.4 cm collection was core biopsy which showed high-grade DCIS. The other 2 areas were benign. The patient denies any history of breast pain, nipple discharge or breast mass bilaterally. She had a mother with colon and ovarian cancer. This was her first biopsy.  The patient is a 72 year old female.   Medication History [name], RN; 05/02/2017 7:50 AM) Medications Reconciled     Review of Systems (Esvin Hnat A. Marlita Keil MD; 05/02/2017 10:41 AM) General Not Present- Appetite Loss, Chills, Fatigue, Fever, Night Sweats, Weight Gain and Weight Loss. Note: All other systems negative (unless as noted in HPI & included Review of Systems) Skin Not Present- Change in Wart/Mole, Dryness, Hives, Jaundice, New Lesions, Non-Healing Wounds, Rash and Ulcer. HEENT Not Present- Earache, Hearing Loss, Hoarseness, Nose Bleed, Oral Ulcers, Ringing in the Ears, Seasonal Allergies, Sinus Pain, Sore Throat, Visual Disturbances, Wears glasses/contact lenses and Yellow Eyes. Respiratory Not Present- Bloody sputum, Chronic Cough, Difficulty Breathing, Snoring and Wheezing. Breast Not Present- Breast Mass, Breast Pain, Nipple Discharge and Skin Changes. Cardiovascular Not Present- Chest Pain, Difficulty Breathing Lying Down, Leg Cramps, Palpitations, Rapid Heart Rate, Shortness of Breath and Swelling of Extremities. Gastrointestinal Not Present- Abdominal Pain, Bloating, Bloody Stool, Change in Bowel Habits, Chronic diarrhea,  Constipation, Difficulty Swallowing, Excessive gas, Gets full quickly at meals, Hemorrhoids, Indigestion, Nausea, Rectal Pain and Vomiting. Female Genitourinary Not Present- Frequency, Nocturia, Painful Urination, Pelvic Pain and Urgency. Musculoskeletal Not Present- Back Pain, Joint Pain, Joint Stiffness, Muscle Pain, Muscle Weakness and Swelling of Extremities. Neurological Not Present- Decreased Memory, Fainting, Headaches, Numbness, Seizures, Tingling, Tremor, Trouble walking and Weakness. Psychiatric Not Present- Anxiety, Bipolar, Change in Sleep Pattern, Depression, Fearful and Frequent crying. Endocrine Not Present- Cold Intolerance, Excessive Hunger, Hair Changes, Heat Intolerance, Hot flashes and New Diabetes. Hematology Not Present- Easy Bruising, Excessive bleeding, Gland problems, HIV and Persistent Infections.   Physical Exam (Shaiden Aldous A. Ora Mcnatt MD; 05/02/2017 10:35 AM)  General Mental Status-Alert. General Appearance-Consistent with stated age. Hydration-Well hydrated. Voice-Normal.  Head and Neck Head-normocephalic, atraumatic with no lesions or palpable masses. Trachea-midline. Thyroid Gland Characteristics - normal size and consistency.  Eye Eyeball - Bilateral-Extraocular movements intact. Sclera/Conjunctiva - Bilateral-No scleral icterus.  Chest and Lung Exam Chest and lung exam reveals -quiet, even and easy respiratory effort with no use of accessory muscles and on auscultation, normal breath sounds, no adventitious sounds and normal vocal resonance. Inspection Chest Wall - Normal. Back - normal.  Breast Note: bruising left breast no mass right breast normal  Cardiovascular Cardiovascular examination reveals -normal heart sounds, regular rate and rhythm with no murmurs and normal pedal pulses bilaterally.  Neurologic Neurologic evaluation reveals -alert and oriented x 3 with no impairment of recent or remote memory. Mental  Status-Normal.  Musculoskeletal Normal Exam - Left-Upper Extremity Strength Normal and Lower Extremity Strength Normal. Normal Exam - Right-Upper Extremity Strength Normal and Lower Extremity Strength Normal.  Lymphatic Head & Neck  General Head & Neck Lymphatics: Bilateral - Description - Normal. Axillary -Note:right axillA with mildly enlarged LN LEFT AXILLA NORMAL.  Assessment & Plan (Alekai Pocock A. Lucindy Borel MD; 05/02/2017 10:39 AM)  BREAST NEOPLASM, TIS (DCIS), LEFT (D05.12) Impression: Patient has opted for left breast seed localized partial mastectomy. We discussed the pros and cons of breast conservation versus mastectomy with reconstruction. Long-term expectations and complications of both discussed.    Risk of lumpectomy include bleeding, infection, seroma, more surgery, use of seed/wire, wound care, cosmetic deformity and the need for other treatments, death , blood clots, death. Pt agrees to proceed.  Current Plans You are being scheduled for surgery- Our schedulers will call you.  You should hear from our office's scheduling department within 5 working days about the location, date, and time of surgery. We try to make accommodations for patient's preferences in scheduling surgery, but sometimes the OR schedule or the surgeon's schedule prevents [place] from making those accommodations.  If you have not heard from our office [phone]) in 5 working days, call the office and ask for your surgeon's nurse.  If you have other questions about your diagnosis, plan, or surgery, call the office and ask for your surgeon's nurse.  We discussed the staging and pathophysiology of breast cancer. We discussed all of the different options for treatment for breast cancer including surgery, chemotherapy, radiation therapy, Herceptin, and antiestrogen therapy. We discussed a sentinel lymph node biopsy as she does not appear to having lymph node involvement right now. We discussed  the performance of that with injection of radioactive tracer and blue dye. We discussed that she would have an incision underneath her axillary hairline. We discussed that there is a bout a 10-20% chance of having a positive node with a sentinel lymph node biopsy and we will await the permanent pathology to make any other first further decisions in terms of her treatment. One of these options might be to return to the operating room to perform an axillary lymph node dissection. We discussed about a 1-2% risk lifetime of chronic shoulder pain as well as lymphedema associated with a sentinel lymph node biopsy. We discussed the options for treatment of the breast cancer which included lumpectomy versus a mastectomy. We discussed the performance of the lumpectomy with a wire placement. We discussed a 10-20% chance of a positive margin requiring reexcision in the operating room. We also discussed that she may need radiation therapy or antiestrogen therapy or both if she undergoes lumpectomy. We discussed the mastectomy and the postoperative care for that as well. We discussed that there is no difference in her survival whether she undergoes lumpectomy with radiation therapy or antiestrogen therapy versus a mastectomy. There is a slight difference in the local recurrence rate being 3-5% with lumpectomy and about 1% with a mastectomy. We discussed the risks of operation including bleeding, infection, possible reoperation. She understands her further therapy will be based on what her stages at the time of her operation.  Pt Education - [facility]b breast cancer surgery: discussed with patient and provided information. Pt Education - [facility] Breast Biopsy HCI: discussed with patient and provided information. Pt Education - ABC (After Breast Cancer) Class Info: discussed with patient and provided information.

## 2017-05-02 NOTE — Progress Notes (Signed)
Radiation Oncology         [phone] ________________________________  Initial outpatient Consultation  Name: [name] MRN: [id number]  Date: 05/02/2017  DOB: [birth year]  IA:[id number], Laurian Brim, NP-C  Erroll Luna, MD   REFERRING PHYSICIAN: Erroll Luna, MD  DIAGNOSIS:    ICD-10-CM   1. Carcinoma of upper-outer quadrant of left breast in female, estrogen receptor positive ([place]) C50.412    Z17.0   Cancer Staging Ductal carcinoma in situ (DCIS) of left breast Staging form: Breast, AJCC 8th Edition - Clinical stage from 05/02/2017: Stage 0 (cTis (DCIS), cN0, cM0, ER: Positive, PR: Positive, HER2: Not assessed ) - Unsigned Staging comments: Staged at breast conference on 1.23.19   Stage 0 Tis N0M0 Left Breast UOQ Ductal carcinoma in situ, ER+ / PR+, High grade  CHIEF COMPLAINT: Here to discuss management of left breast cancer  HISTORY OF PRESENT ILLNESS::[name] is a 72 y.o. female who presented for screening mammogram which showed calcifications in the upper outer quadrant left breast.  Biopsy showed DCIS 5 mm in greatest dimension with characteristics as described above in the diagnosis. Imaging reviewed by me today in tumor board.  Of note, she was on hormone replacement therapy for 6 months and stopped in 1998.  She is accompanied by her son and daughter-in-law today. She is a non-smoker. She reports lower extremity edema as a baseline with more swelling in the left.   PREVIOUS RADIATION THERAPY: No  PAST MEDICAL HISTORY:  has a past medical history of Abnormal bone density screening, Diverticulosis, DM (diabetes mellitus) ([place]), GERD (gastroesophageal reflux disease), HTN (hypertension), Hyperlipidemia, Poor compliance, and Vitamin D deficiency.    PAST SURGICAL HISTORY: Past Surgical History:  Procedure Laterality Date  . APPENDECTOMY    . DILATION AND CURETTAGE OF UTERUS    . FOREIGN BODY REMOVAL ESOPHAGEAL    . TONSILLECTOMY    . TUBAL LIGATION        FAMILY HISTORY: family history includes Colon cancer in her mother.  SOCIAL HISTORY:  reports that  has never smoked. she has never used smokeless tobacco. She reports that she does not drink alcohol or use drugs.  ALLERGIES: Atorvastatin; Lisinopril; and Tetracycline  MEDICATIONS:  Current Outpatient Medications  Medication Sig Dispense Refill  . ACCU-CHEK AVIVA PLUS test strip TEST TWICE A DAY. NEEDS AN ACCU-CHEK METER AS WELL. DX CODE- E11.9 100 each 5  . aspirin 325 MG EC tablet Take 325 mg by mouth daily.    . Blood Glucose Monitoring Suppl (ACCU-CHEK AVIVA PLUS) w/Device KIT USE AS DIRECTED 2 TIMES DAILY 1 kit 0  . glipiZIDE (GLUCOTROL) 5 MG tablet TAKE 1 TABLET BY MOUTH EVERY DAY BEFORE BREAKFAST 30 tablet 5  . metFORMIN (GLUCOPHAGE) 1000 MG tablet TAKE 1 TABLET BY MOUTH TWICE A DAY WITH A MEAL 180 tablet 1  . metoprolol tartrate (LOPRESSOR) 25 MG tablet TAKE 1 TABLET (25 MG TOTAL) BY MOUTH DAILY. 90 tablet 1  . OVER THE COUNTER MEDICATION Calcium 1200 with Vitamin D 1000- 1 tab daily    . OVER THE COUNTER MEDICATION Saffron Extract 88.50 mg - 1 daily    . pravastatin (PRAVACHOL) 20 MG tablet TAKE 1 TABLET BY MOUTH EVERY DAY 30 tablet 5  . vitamin C (ASCORBIC ACID) 500 MG tablet Take 500 mg by mouth daily.     No current facility-administered medications for this encounter.    Gynecologic History  Age at first menstrual period? 14  Are you still having periods?  No Approximate date of last period? 1997  If you are still having periods: Are your periods regular? No  If you no longer have periods: Have you used hormone replacement? Yes  If YES, for how long? 6 mos. When did you stop? 1998 Obstetric History:  How many children have you carried to term? 2 Your age at first live birth? 60  Pregnant now or trying to get pregnant? No  Have you used birth control pills or hormone shots for contraception? Yes  If so, for how long (or approximate dates)? [id number]  Would you be  interested in learning more about the options to preserve fertility? No Health Maintenance:  Have you ever had a colonoscopy? Yes If yes, date? 2005?  Have you ever had a bone density? Yes If yes, date? 2019  Date of your last PAP smear? 1998 Date of your FIRST mammogram? 04/12/17  REVIEW OF SYSTEMS: A 10+ POINT REVIEW OF SYSTEMS WAS OBTAINED including neurology, dermatology, psychiatry, cardiac, respiratory, lymph, extremities, GI, GU, Musculoskeletal, constitutional, breasts, reproductive, HEENT.  All pertinent positives are noted in the HPI.  All others are negative.   PHYSICAL EXAM:  Vitals with BMI 05/02/2017  Height _0   Weight 206 lbs 11 oz  BMI 12.7  Systolic 517  Diastolic 83  Pulse 71  Respirations 18    General: Alert and oriented, in no acute distress HEENT: Head is normocephalic. Extraocular movements are intact. Oropharynx is clear. She does have upper dentures which were not removed. Neck: Neck is supple, no palpable cervical or supraclavicular lymphadenopathy. Heart: Regular in rate and rhythm. Soft systolic murmur in the region of the pulmonary valve. Chest: Clear to auscultation bilaterally, with no rhonchi, wheezes, or rales. Abdomen: Soft, nontender, nondistended, with no rigidity or guarding. Extremities: No cyanosis. Lower extremity edema, more so on the left which is consistent with her baseline. Lymphatics: see Neck Exam Skin: No concerning lesions. Musculoskeletal: symmetric strength and muscle tone throughout. Neurologic: Cranial nerves II through XII are grossly intact. No obvious focalities. Speech is fluent. Coordination is intact. Psychiatric: Judgment and insight are intact. Affect is appropriate. Breasts: Significant bruising throughout the left breast. Post-biopsy firmness from 11 o'clock to 2 o'clock position. No other palpable masses appreciated in the breasts or axillae.  ECOG = 0  0 - Asymptomatic (Fully active, able to carry on all predisease  activities without restriction)  1 - Symptomatic but completely ambulatory (Restricted in physically strenuous activity but ambulatory and able to carry out work of a light or sedentary nature. For example, light housework, office work)  2 - Symptomatic, <50% in bed during the day (Ambulatory and capable of all self care but unable to carry out any work activities. Up and about more than 50% of waking hours)  3 - Symptomatic, >50% in bed, but not bedbound (Capable of only limited self-care, confined to bed or chair 50% or more of waking hours)  4 - Bedbound (Completely disabled. Cannot carry on any self-care. Totally confined to bed or chair)  5 - Death   Eustace Pen MM, Creech RH, Tormey DC, et al. [phone]). "Toxicity and response criteria of the [facility]". [place] Oncol. 5 (6): 649-55   LABORATORY DATA:  Lab Results  Component Value Date   WBC 11.0 (H) 05/02/2017   HGB 14.6 10/09/2016   HCT 39.1 05/02/2017   MCV 82.1 05/02/2017   PLT 324 05/02/2017   CMP     Component Value Date/Time  NA 140 05/02/2017 0807   K 4.3 05/02/2017 0807   CL 105 05/02/2017 0807   CO2 26 05/02/2017 0807   GLUCOSE 158 (H) 05/02/2017 0807   BUN 13 05/02/2017 0807   CREATININE 0.57 10/09/2016 1048   CALCIUM 9.3 05/02/2017 0807   PROT 6.9 05/02/2017 0807   ALBUMIN 3.7 05/02/2017 0807   AST 23 05/02/2017 0807   ALT 25 05/02/2017 0807   ALKPHOS 62 05/02/2017 0807   BILITOT 0.5 05/02/2017 0807   GFRNONAA >60 05/02/2017 0807   GFRNONAA >89 10/09/2016 1048   GFRAA >60 05/02/2017 0807   GFRAA >89 10/09/2016 1048         RADIOGRAPHY:  Dg Bone Density  Result Date: 04/12/2017 EXAM: DUAL X-RAY ABSORPTIOMETRY (DXA) FOR BONE MINERAL DENSITY IMPRESSION: Referring Physician:  Girtha Rm PATIENT: Name: Shivali, Quackenbush Patient ID: [id number] Birth Date: Jun 18, 1945 Height: 64.7 in. Sex: Female Measured: 04/12/2017 Weight: 210.0 lbs. Indications: Caucasian, Estrogen Deficient,  Height Loss (781.91), History of Fracture (Adult) (V15.51), Low Calcium Intake (269.3), Postmenopausal Fractures: Elbow Treatments: Vitamin D (E933.5) ASSESSMENT: The BMD measured at Forearm Radius 33% is 0.617 g/cm2 with a T-score of -3.1. This patient is considered osteoporotic according to [facility]) criteria. Lumbar spine was not utilized due to advanced degenerative changes. Patient does not meet criteria for FRAX assessment. Site Region Measured Date Measured Age YA BMD Significant CHANGE T-score Left Forearm Radius 33% 04/12/2017 69.5 -3.1 0.617 g/cm2 DualFemur Total Left 6/12/2017 69.5 -2.10 0.658 g/cm2 World Health Organization [facility]) criteria for post-menopausal, Caucasian Women: Normal       T-score at or above -1 SD Osteopenia   T-score between -1 and -2.5 SD Osteoporosis T-score at or below -2.5 SD RECOMMENDATION: [place] recommends that FDA-approved medical therapies be considered in postmenopausal women and men age 65 or older with a: 1. Hip or vertebral (clinical or morphometric) fracture. 2. T-score of <-2.5 at the spine or hip. 3. Ten-year fracture probability by FRAX of 3% or greater for hip fracture or 20% or greater for major osteoporotic fracture. All treatment decisions require clinical judgment and consideration of individual patient factors, including patient preferences, co-morbidities, previous drug use, risk factors not captured in the FRAX model (e.g. falls, vitamin D deficiency, increased bone turnover, interval significant decline in bone density) and possible under - or over-estimation of fracture risk by FRAX. All patients should ensure an adequate intake of dietary calcium (1200 mg/d) and vitamin D (800 IU daily) unless contraindicated. FOLLOW-UP: People with diagnosed cases of osteoporosis or at high risk for fracture should have regular bone mineral density tests. For patients eligible for Medicare, routine testing is allowed once every  2 years. The testing frequency can be increased to one year for patients who have rapidly progressing disease, those who are receiving or discontinuing medical therapy to restore bone mass, or have additional risk factors. I have reviewed this report, and agree with the above findings. [facility] Radiology Electronically Signed   By: Marijo Conception, M.D.   On: 04/12/2017 15:30   Mm Digital Diagnostic Unilat L  Result Date: 04/18/2017 CLINICAL DATA:  Patient returns after baseline screening study for evaluation of left breast calcifications. EXAM: DIGITAL DIAGNOSTIC LEFT MAMMOGRAM WITH CAD COMPARISON:  04/12/2017 ACR Breast Density Category b: There are scattered areas of fibroglandular density. FINDINGS: Magnified views are performed of calcifications in the upper central left breast. On these views there are multiple groups of calcifications: 1. A group of course heterogeneous  calcifications in the upper inner quadrant measures 8 millimeters. Biopsy is indicated. 2. A group of fine pleomorphic calcifications with linear distribution is identified in the anterior upper outer quadrant, measuring 1.4 centimeters. Biopsy is indicated. 3. A smaller group of fine pleomorphic calcifications is identified in the most lateral portion of the upper outer quadrant, measuring 7 millimeters. Followup is indicated if other biopsies are benign. Mammographic images were processed with CAD. IMPRESSION: Multiple indeterminate left breast calcifications. Tissue diagnosis is indicated. RECOMMENDATION: 1. Stereotactic guided core biopsy of coarse heterogeneous calcifications in the upper inner quadrant left breast. 2. Stereotactic guided core biopsy of fine pleomorphic calcifications in the anterior upper outer quadrant left breast. 3. Pending results of the 2 biopsies, a 3rd group of calcifications in the upper-outer quadrant of the left breast can be followed or biopsied. I have discussed the findings and recommendations with the  patient. Results were also provided in writing at the conclusion of the visit. If applicable, a reminder letter will be sent to the patient regarding the next appointment. BI-RADS CATEGORY  4: Suspicious. Electronically Signed   By: Nolon Nations M.D.   On: 04/18/2017 16:29   Mm Digital Screening Bilateral  Result Date: 04/12/2017 CLINICAL DATA:  Screening. Baseline. EXAM: DIGITAL SCREENING BILATERAL MAMMOGRAM WITH CAD COMPARISON:  None. ACR Breast Density Category b: There are scattered areas of fibroglandular density. FINDINGS: In the left breast, calcifications warrant further evaluation. In the right breast, no findings suspicious for malignancy. Images were processed with CAD. IMPRESSION: Further evaluation is suggested for calcifications in the left breast. RECOMMENDATION: Diagnostic mammogram of the left breast. (Code:[id number]) The patient will be contacted regarding the findings, and additional imaging will be scheduled. BI-RADS CATEGORY  0: Incomplete. Need additional imaging evaluation and/or prior mammograms for comparison. Electronically Signed   By: Evangeline Dakin M.D.   On: 04/12/2017 13:58   Mm Clip Placement Left  Result Date: 04/25/2017 CLINICAL DATA:  Status post stereotactic guided core biopsy of calcifications in the posterior upper outer quadrant of the left breast. EXAM: DIAGNOSTIC LEFT MAMMOGRAM POST STEREOTACTIC BIOPSY COMPARISON:  Previous exam(s). FINDINGS: Mammographic images were obtained following stereotactic guided biopsy of calcifications in the posterior upper outer quadrant of left breast. A ribbon shaped clip is identified in the area of the biopsy. IMPRESSION: Tissue marker clip in the expected location following biopsy. Final Assessment: Post Procedure Mammograms for Marker Placement Electronically Signed   By: Nolon Nations M.D.   On: 04/25/2017 15:12   Mm Clip Placement Left  Result Date: 04/24/2017 CLINICAL DATA:  Evaluate biopsy clips. EXAM: DIAGNOSTIC  BILATERAL MAMMOGRAM POST STEREOTACTIC BIOPSY COMPARISON:  Previous exam(s). FINDINGS: Mammographic images were obtained following stereotactic guided biopsy of 2 groups of calcifications. The coil shaped clip is at the site biopsied upper outer anterior calcifications. The ribbon shaped clip is at the site upper inner biopsied calcifications. IMPRESSION: Clip placement as above. Final Assessment: Post Procedure Mammograms for Marker Placement Electronically Signed   By: Dorise Bullion III M.D   On: 04/24/2017 09:46   Mm Lt Breast Bx W Loc Dev 1st Lesion Image Bx Spec Stereo Guide  Addendum Date: 04/26/2017   ADDENDUM REPORT: 04/26/2017 14:31 ADDENDUM: Pathology revealed FIBROADENOMATOID AND FIBROCYSTIC CHANGE WITH CALCIFICATIONS, VASCULAR CALCIFICATIONS of the Left breast, outer, (ribbon clip). This was found to be concordant by Dr. Nolon Nations. Pathology results were discussed with the patient by telephone. The patient reported doing well after the biopsy with tenderness at the site. Post biopsy instructions  and care were reviewed and questions were answered. The patient was encouraged to call [facility] for any additional concerns. The patient has a recent diagnosis of left breast cancer and should follow her outlined treatment plan. Pathology results reported by Terie Purser, RN on 04/26/2017. Electronically Signed   By: Nolon Nations M.D.   On: 04/26/2017 14:31   Result Date: 04/26/2017 CLINICAL DATA:  Patient presents for stereotactic guided core biopsy of a 3rd site calcifications in the left breast. This biopsy is performed of calcifications in the posterior upper-outer quadrant of the left breast. EXAM: LEFT BREAST STEREOTACTIC CORE NEEDLE BIOPSY COMPARISON:  Previous exams. FINDINGS: The patient and I discussed the procedure of stereotactic-guided biopsy including benefits and alternatives. We discussed the high likelihood of a successful procedure. We discussed the  risks of the procedure including infection, bleeding, tissue injury, clip migration, and inadequate sampling. Informed written consent was given. The usual time out protocol was performed immediately prior to the procedure. Using sterile technique and 1% Lidocaine as local anesthetic, under stereotactic guidance, a 9 gauge vacuum assisted device was used to perform core needle biopsy of calcifications in the posterior upper outer quadrant of the left breast using a superior to inferior approach. Specimen radiograph was performed showing calcifications in multiple specimens. Specimens with calcifications are identified for pathology. Lesion quadrant: Posterior upper-outer quadrant left breast At the conclusion of the procedure, a ribbon shaped tissue marker clip was deployed into the biopsy cavity. Follow-up 2-view mammogram was performed and dictated separately. IMPRESSION: Stereotactic-guided biopsy of calcifications in the posterior upper-outer quadrant left breast. No apparent complications. Electronically Signed: By: Nolon Nations M.D. On: 04/25/2017 15:11   Mm Lt Breast Bx W Loc Dev 1st Lesion Image Bx Spec Stereo Guide  Addendum Date: 04/26/2017   ADDENDUM REPORT: 04/26/2017 11:43 ADDENDUM: Pathology revealed HIGH GRADE DUCTAL CARCINOMA IN SITU of the Left breast, upper outer anterior. FIBROADENOMATOID NODULE WITH CALCIFICATIONS of the Left breast, upper inner. This was found to be concordant by Dr. Dorise Bullion. Pathology results were discussed with the patient by telephone. The patient reported doing well after the biopsies with tenderness at the sites. Post biopsy instructions and care were reviewed and questions were answered. The patient was encouraged to call [facility] for any additional concerns. The patient was referred to [facility] on May 02, 2017. The patient returned for an  additional Left breast stereotatic biopsy on April 25, 2017 which is dictated in a separate report. Pathology results reported by Terie Purser, RN on 04/26/2017. Electronically Signed   By: Dorise Bullion III M.D   On: 04/26/2017 11:43   Result Date: 04/26/2017 CLINICAL DATA:  Biopsy of upper outer anterior left breast calcifications EXAM: LEFT BREAST STEREOTACTIC CORE NEEDLE BIOPSY COMPARISON:  Previous exams. FINDINGS: The patient and I discussed the procedure of stereotactic-guided biopsy including benefits and alternatives. We discussed the high likelihood of a successful procedure. We discussed the risks of the procedure including infection, bleeding, tissue injury, clip migration, and inadequate sampling. Informed written consent was given. The usual time out protocol was performed immediately prior to the procedure. Using sterile technique and 1% Lidocaine as local anesthetic, under stereotactic guidance, a 9 gauge vacuum assisted device was used to perform core needle biopsy of calcifications in the upper outer anterior left breast using a superior approach. Specimen radiograph was performed showing calcifications in multiple specimens.  Specimens with calcifications are identified for pathology. Lesion quadrant: Upper-outer At the conclusion of the procedure, a coil shaped tissue marker clip was deployed into the biopsy cavity. Follow-up 2-view mammogram was performed and dictated separately. IMPRESSION: Stereotactic-guided biopsy of left breast calcifications in the upper outer anterior quadrant. No apparent complications. Electronically Signed: By: Dorise Bullion III M.D On: 04/24/2017 09:48   Mm Lt Breast Bx W Loc Dev Ea Ad Lesion Img Bx Spec Stereo Guide  Addendum Date: 04/26/2017   ADDENDUM REPORT: 04/26/2017 11:42 ADDENDUM: Pathology revealed HIGH GRADE DUCTAL CARCINOMA IN SITU of the Left breast, upper outer anterior. FIBROADENOMATOID NODULE WITH CALCIFICATIONS of the Left breast, upper  inner. This was found to be concordant by Dr. Dorise Bullion. Pathology results were discussed with the patient by telephone. The patient reported doing well after the biopsies with tenderness at the sites. Post biopsy instructions and care were reviewed and questions were answered. The patient was encouraged to call [facility] for any additional concerns. The patient was referred to [facility] on May 02, 2017. The patient returned for an additional Left breast stereotatic biopsy on April 25, 2017 which is dictated in a separate report. Pathology results reported by Terie Purser, RN on 04/26/2017. Electronically Signed   By: Dorise Bullion III M.D   On: 04/26/2017 11:42   Result Date: 04/26/2017 CLINICAL DATA:  Biopsy of left breast upper inner calcifications EXAM: LEFT BREAST STEREOTACTIC CORE NEEDLE BIOPSY COMPARISON:  Previous exams. FINDINGS: The patient and I discussed the procedure of stereotactic-guided biopsy including benefits and alternatives. We discussed the high likelihood of a successful procedure. We discussed the risks of the procedure including infection, bleeding, tissue injury, clip migration, and inadequate sampling. Informed written consent was given. The usual time out protocol was performed immediately prior to the procedure. Using sterile technique and 1% Lidocaine as local anesthetic, under stereotactic guidance, a 9 gauge vacuum assisted device was used to perform core needle biopsy of calcifications in the upper inner left breast using a superior approach. Specimen radiograph was performed showing calcifications in multiple specimens. Specimens with calcifications are identified for pathology. Lesion quadrant: Upper-outer At the conclusion of the procedure, a ribbon shaped tissue marker clip was deployed into the biopsy cavity. Follow-up 2-view mammogram was performed and  dictated separately. IMPRESSION: Stereotactic-guided biopsy of calcifications in the upper inner left breast. No apparent complications. Electronically Signed: By: Dorise Bullion III M.D On: 04/24/2017 09:48      IMPRESSION/PLAN: Stage 0 Tis N0M0 Left Breast UOQ Ductal carcinoma in situ, ER+ / PR+, High grade   She has been discussed at our multidisciplinary tumor board.  The consensus is that she would be a good candidate for breast conservation. I talked to her about the option of a mastectomy and informed her that her expected overall survival would be equivalent between mastectomy and breast conservation, based upon randomized controlled data. She is enthusiastic about breast conservation.  It was a pleasure meeting the patient today. We discussed the risks, benefits, and side effects of radiotherapy. I recommend radiotherapy to the left breast to reduce her risk of locoregional recurrence by 1/2.  We discussed that radiation would take approximately 4 weeks to complete and that I would give the patient a few weeks to heal following surgery before starting treatment planning.  If chemotherapy were to be given - unlikely-, this would precede radiotherapy. We spoke about acute  effects including skin irritation and fatigue as well as much less common late effects including internal organ injury or irritation. We spoke about the latest technology that is used to minimize the risk of late effects for patients undergoing radiotherapy to the breast or chest wall. No guarantees of treatment were given. The patient is enthusiastic about proceeding with treatment. I look forward to participating in the patient's care.  I will await her referral back to me for postoperative follow-up and eventual CT simulation/treatment planning.    __________________________________________   Eppie Gibson, MD    This document serves as a record of services personally performed by Eppie Gibson, MD. It was created on her  behalf by Arlyce Harman, a trained medical scribe. The creation of this record is based on the scribe's personal observations and the provider's statements to them. This document has been checked and approved by the attending provider.

## 2017-05-02 NOTE — Progress Notes (Signed)
[place] CONSULT NOTE  Patient Care Team: Girtha Rm, NP-C as PCP - General (Family Medicine)  CHIEF COMPLAINTS/PURPOSE OF CONSULTATION:  Newly diagnosed left breast DCIS  HISTORY OF PRESENTING ILLNESS:  [name] 72 y.o. female is here because of recent diagnosis of left breast DCIS.  Patient underwent a routine screening mammogram at the insistence of her primary care physician.  In fact this is her first mammogram.  Mammogram revealed group of calcifications in the upper outer quadrant.  In fact there were 3 groups of calcifications 2 of which were benign.  A third group of calcifications measuring 1.4 cm was biopsied proven to be high-grade DCIS.  The DCIS was ER PR positive and she was presented this morning in the multidisciplinary tumor board.  She is here today accompanied by her family to discuss her treatment plan.  She was extremely emotional and had very high blood pressure when she arrived but soon as she realized that she had a precancerous lesion like DCIS she relaxed and was smiling but the end of the visit.  I reviewed her records extensively and collaborated the history with the patient.  SUMMARY OF ONCOLOGIC HISTORY:   Ductal carcinoma in situ (DCIS) of left breast   04/24/2017 Initial Diagnosis    Screening detected left breast calcifications in 3 areas UOQ, anterior group of calcifications 1.4 cm biopsy revealed high-grade DCIS with calcifications ER 95%, PR 95%, other 2 areas benign fibrocystic changes, Tis N0 stage 0       MEDICAL HISTORY:  Past Medical History:  Diagnosis Date  . Abnormal bone density screening   . Diverticulosis   . DM (diabetes mellitus) ([place])   . GERD (gastroesophageal reflux disease)   . HTN (hypertension)   . Hyperlipidemia   . Poor compliance   . Vitamin D deficiency     SURGICAL HISTORY: Past Surgical History:  Procedure Laterality Date  . APPENDECTOMY    . DILATION AND CURETTAGE OF UTERUS    .  FOREIGN BODY REMOVAL ESOPHAGEAL    . TONSILLECTOMY    . TUBAL LIGATION      SOCIAL HISTORY: Social History   Socioeconomic History  . Marital status: Married    Spouse name: Not on file  . Number of children: Not on file  . Years of education: Not on file  . Highest education level: Not on file  Social Needs  . Financial resource strain: Not on file  . Food insecurity - worry: Not on file  . Food insecurity - inability: Not on file  . Transportation needs - medical: Not on file  . Transportation needs - non-medical: Not on file  Occupational History  . Not on file  Tobacco Use  . Smoking status: Never Smoker  . Smokeless tobacco: Never Used  Substance and Sexual Activity  . Alcohol use: No  . Drug use: No  . Sexual activity: Not on file  Other Topics Concern  . Not on file  Social History Narrative  . Not on file    FAMILY HISTORY: Family History  Problem Relation Age of Onset  . Colon cancer Mother     ALLERGIES:  is allergic to atorvastatin; lisinopril; and tetracycline.  MEDICATIONS:  Current Outpatient Medications  Medication Sig Dispense Refill  . ACCU-CHEK AVIVA PLUS test strip TEST TWICE A DAY. NEEDS AN ACCU-CHEK METER AS WELL. DX CODE- E11.9 100 each 5  . aspirin 325 MG EC tablet Take 325 mg by mouth daily.    Marland Kitchen  Blood Glucose Monitoring Suppl (ACCU-CHEK AVIVA PLUS) w/Device KIT USE AS DIRECTED 2 TIMES DAILY 1 kit 0  . glipiZIDE (GLUCOTROL) 5 MG tablet TAKE 1 TABLET BY MOUTH EVERY DAY BEFORE BREAKFAST 30 tablet 5  . metFORMIN (GLUCOPHAGE) 1000 MG tablet TAKE 1 TABLET BY MOUTH TWICE A DAY WITH A MEAL 180 tablet 1  . metoprolol tartrate (LOPRESSOR) 25 MG tablet TAKE 1 TABLET (25 MG TOTAL) BY MOUTH DAILY. 90 tablet 1  . OVER THE COUNTER MEDICATION Calcium 1200 with Vitamin D 1000- 1 tab daily    . OVER THE COUNTER MEDICATION Saffron Extract 88.50 mg - 1 daily    . pravastatin (PRAVACHOL) 20 MG tablet TAKE 1 TABLET BY MOUTH EVERY DAY 30 tablet 5  . vitamin C  (ASCORBIC ACID) 500 MG tablet Take 500 mg by mouth daily.     No current facility-administered medications for this visit.     REVIEW OF SYSTEMS:   Constitutional: Denies fevers, chills or abnormal night sweats Eyes: Denies blurriness of vision, double vision or watery eyes Ears, nose, mouth, throat, and face: Denies mucositis or sore throat Respiratory: Denies cough, dyspnea or wheezes Cardiovascular: Denies palpitation, chest discomfort or lower extremity swelling Gastrointestinal:  Denies nausea, heartburn or change in bowel habits Skin: Denies abnormal skin rashes Lymphatics: Denies new lymphadenopathy or easy bruising Neurological:Denies numbness, tingling or new weaknesses Behavioral/Psych: Mood is stable, no new changes  Breast:  Denies any palpable lumps or discharge All other systems were reviewed with the patient and are negative.  PHYSICAL EXAMINATION: ECOG PERFORMANCE STATUS: 0 - Asymptomatic  Vitals:   05/02/17 0845  BP: (!) 215/83  Pulse: 71  Resp: 18  Temp: 98.5 F (36.9 C)  SpO2: 99%   Filed Weights   05/02/17 0845  Weight: 206 lb 11.2 oz (93.8 kg)    GENERAL:alert, no distress and comfortable SKIN: skin color, texture, turgor are normal, no rashes or significant lesions EYES: normal, conjunctiva are pink and non-injected, sclera clear OROPHARYNX:no exudate, no erythema and lips, buccal mucosa, and tongue normal  NECK: supple, thyroid normal size, non-tender, without nodularity LYMPH:  no palpable lymphadenopathy in the cervical, axillary or inguinal LUNGS: clear to auscultation and percussion with normal breathing effort HEART: regular rate & rhythm and no murmurs and no lower extremity edema ABDOMEN:abdomen soft, non-tender and normal bowel sounds Musculoskeletal:no cyanosis of digits and no clubbing  PSYCH: alert & oriented x 3 with fluent speech NEURO: no focal motor/sensory deficits BREAST: Bruising related to prior biopsy, no palpable nodules in  breast. No palpable axillary or supraclavicular lymphadenopathy (exam performed in the presence of a chaperone)   LABORATORY DATA:  I have reviewed the data as listed Lab Results  Component Value Date   WBC 11.0 (H) 05/02/2017   HGB 14.6 10/09/2016   HCT 39.1 05/02/2017   MCV 82.1 05/02/2017   PLT 324 05/02/2017   Lab Results  Component Value Date   NA 140 05/02/2017   K 4.3 05/02/2017   CL 105 05/02/2017   CO2 26 05/02/2017    RADIOGRAPHIC STUDIES: I have personally reviewed the radiological reports and agreed with the findings in the report.  ASSESSMENT AND PLAN:  Ductal carcinoma in situ (DCIS) of left breast 04/24/2017: Screening detected left breast calcifications in 3 areas UOQ, anterior group of calcifications 1.4 cm biopsy revealed high-grade DCIS with calcifications ER 95%, PR 95%, other 2 areas benign fibrocystic changes, Tis N0 stage 0  Pathology review: I discussed with the patient the  difference between DCIS and invasive breast cancer. It is considered a precancerous lesion. DCIS is classified as a 0. It is generally detected through mammograms as calcifications. We discussed the significance of grades and its impact on prognosis. We also discussed the importance of ER and PR receptors and their implications to adjuvant treatment options. Prognosis of DCIS dependence on grade, comedo necrosis. It is anticipated that if not treated, 20-30% of DCIS can develop into invasive breast cancer.  Recommendation: 1. Breast conserving surgery 2. Followed by adjuvant radiation therapy 3. Followed by antiestrogen therapy with tamoxifen 5 years  Tamoxifen counseling: We discussed the risks and benefits of tamoxifen. These include but not limited to insomnia, hot flashes, mood changes, vaginal dryness, and weight gain. Although rare, serious side effects including endometrial cancer, risk of blood clots were also discussed. We strongly believe that the benefits far outweigh the risks.  Patient understands these risks and consented to starting treatment. Planned treatment duration is 5 years.  Return to clinic after surgery to discuss the final pathology report and come up with an adjuvant treatment plan.     All questions were answered. The patient knows to call the clinic with any problems, questions or concerns.    Harriette Ohara, MD 05/02/17

## 2017-05-02 NOTE — Progress Notes (Signed)
[place] Psychosocial Distress Screening Clinical Social Work  Clinical Social Work was referred by distress screening protocol.  The patient scored a 2 on the Psychosocial Distress Thermometer which indicates mild distress. Clinical Social Worker Edwyna Shell to assess for distress and other psychosocial needs. CSW and patient discussed common feeling and emotions when being diagnosed with cancer, and the importance of support during treatment. CSW informed patient of the support team and support services at [facility]. CSW provided contact information and encouraged patient to call with any questions or concerns.  Feels like "they have a good treatment plan, I feel like this can be treated."  Has good support at home, does not anticipate transportation needs.  CSW reviewed [employer], encouraged patient to reach out as needed.    ONCBCN DISTRESS SCREENING 05/02/2017  Screening Type Initial Screening  Distress experienced in past week (1-10) 2  Physician notified of physical symptoms No  Referral to clinical psychology No  Referral to clinical social work No  Referral to dietition No  Referral to financial advocate No  Referral to support programs No  Referral to palliative care No     Clinical Social Worker follow up needed: No.  If yes, follow up plan:  Edwyna Shell, LCSW Clinical Social Worker Phone:  [phone]

## 2017-05-02 NOTE — Progress Notes (Signed)
Nutrition Assessment  Reason for Assessment:  Pt seen in Breast Clinic  ASSESSMENT:   72 year old female with new diagnosis of breast cancer.  Past medical history of DM, HTN, osteoporosis, Vit D deficency.    Patient reports normal appetite  Medications:  reviewed  Labs: reviewed  Anthropometrics:   Height: 65 inches Weight: 206 lb 11.2 oz BMI: 34   NUTRITION DIAGNOSIS: Food and nutrition related knowledge deficit related to new diagnosis of breast cancer as evidenced by no prior need for nutrition related information.  INTERVENTION:   Discussed and provided packet of information regarding nutritional tips for breast cancer patients.  Questions answered.  Teachback method used.  Contact information provided and patient knows to contact me with questions/concerns.    MONITORING, EVALUATION, and GOAL: Pt will consume a healthy plant based diet to maintain lean body mass throughout treatment.   Elsey Holts B. Zenia Resides, [place], [place] Registered Dietitian [phone] (pager)

## 2017-05-02 NOTE — H&P (Signed)
[name] Documented: 05/02/2017 7:49 AM Location: [facility] Patient #: [id number] DOB: February 03, 1946 Undefined / Language: [name] / Race: White Female  History of Present Illness [name] A. Laura Lippman MD; 05/02/2017 10:33 AM) Patient words: Patient sent at the request of Dr. Isidore Conley for abnormal screening mammogram on the left. A 1.4 cm collection of left breast microcalcifications in the upper outer quadrant and 2 other adjacent groups of calcifications are localized. The 1.4 cm collection was core biopsy which showed high-grade DCIS. The other 2 areas were benign. The patient denies any history of breast pain, nipple discharge or breast mass bilaterally. She had a mother with colon and ovarian cancer. This was her first biopsy.  The patient is a 72 year old female.   Medication History Laura Pummel, RN; 05/02/2017 7:50 AM) Medications Reconciled     Review of Systems ([name] A. Laura Dross MD; 05/02/2017 10:41 AM) General Not Present- Appetite Loss, Chills, Fatigue, Fever, Night Sweats, Weight Gain and Weight Loss. Note: All other systems negative (unless as noted in HPI & included Review of Systems) Skin Not Present- Change in Wart/Mole, Dryness, Hives, Jaundice, New Lesions, Non-Healing Wounds, Rash and Ulcer. HEENT Not Present- Earache, Hearing Loss, Hoarseness, Nose Bleed, Oral Ulcers, Ringing in the Ears, Seasonal Allergies, Sinus Pain, Sore Throat, Visual Disturbances, Wears glasses/contact lenses and Yellow Eyes. Respiratory Not Present- Bloody sputum, Chronic Cough, Difficulty Breathing, Snoring and Wheezing. Breast Not Present- Breast Mass, Breast Pain, Nipple Discharge and Skin Changes. Cardiovascular Not Present- Chest Pain, Difficulty Breathing Lying Down, Leg Cramps, Palpitations, Rapid Heart Rate, Shortness of Breath and Swelling of Extremities. Gastrointestinal Not Present- Abdominal Pain, Bloating, Bloody Stool, Change in Bowel Habits, Chronic diarrhea,  Constipation, Difficulty Swallowing, Excessive gas, Gets full quickly at meals, Hemorrhoids, Indigestion, Nausea, Rectal Pain and Vomiting. Female Genitourinary Not Present- Frequency, Nocturia, Painful Urination, Pelvic Pain and Urgency. Musculoskeletal Not Present- Back Pain, Joint Pain, Joint Stiffness, Muscle Pain, Muscle Weakness and Swelling of Extremities. Neurological Not Present- Decreased Memory, Fainting, Headaches, Numbness, Seizures, Tingling, Tremor, Trouble walking and Weakness. Psychiatric Not Present- Anxiety, Bipolar, Change in Sleep Pattern, Depression, Fearful and Frequent crying. Endocrine Not Present- Cold Intolerance, Excessive Hunger, Hair Changes, Heat Intolerance, Hot flashes and New Diabetes. Hematology Not Present- Easy Bruising, Excessive bleeding, Gland problems, HIV and Persistent Infections.   Physical Exam ([name] A. Laura Herendeen MD; 05/02/2017 10:35 AM)  General Mental Status-Alert. General Appearance-Consistent with stated age. Hydration-Well hydrated. Voice-Normal.  Head and Neck Head-normocephalic, atraumatic with no lesions or palpable masses. Trachea-midline. Thyroid Gland Characteristics - normal size and consistency.  Eye Eyeball - Bilateral-Extraocular movements intact. Sclera/Conjunctiva - Bilateral-No scleral icterus.  Chest and Lung Exam Chest and lung exam reveals -quiet, even and easy respiratory effort with no use of accessory muscles and on auscultation, normal breath sounds, no adventitious sounds and normal vocal resonance. Inspection Chest Wall - Normal. Back - normal.  Breast Note: bruising left breast no mass right breast normal  Cardiovascular Cardiovascular examination reveals -normal heart sounds, regular rate and rhythm with no murmurs and normal pedal pulses bilaterally.  Neurologic Neurologic evaluation reveals -alert and oriented x 3 with no impairment of recent or remote memory. Mental  Status-Normal.  Musculoskeletal Normal Exam - Left-Upper Extremity Strength Normal and Lower Extremity Strength Normal. Normal Exam - Right-Upper Extremity Strength Normal and Lower Extremity Strength Normal.  Lymphatic Head & Neck  General Head & Neck Lymphatics: Bilateral - Description - Normal. Axillary -Note:right axillA with mildly enlarged LN LEFT AXILLA NORMAL.  Assessment & Plan ([name] A. Laura Eckstein MD; 05/02/2017 10:39 AM)  BREAST NEOPLASM, TIS (DCIS), LEFT (D05.12) Impression: Patient has opted for left breast seed localized partial mastectomy. We discussed the pros and cons of breast conservation versus mastectomy with reconstruction. Long-term expectations and complications of both discussed.    Risk of lumpectomy include bleeding, infection, seroma, more surgery, use of seed/wire, wound care, cosmetic deformity and the need for other treatments, death , blood clots, death. Pt agrees to proceed.  Current Plans You are being scheduled for surgery- Our schedulers will call you.  You should hear from our office's scheduling department within 5 working days about the location, date, and time of surgery. We try to make accommodations for patient's preferences in scheduling surgery, but sometimes the OR schedule or the surgeon's schedule prevents [place] from making those accommodations.  If you have not heard from our office [phone]) in 5 working days, call the office and ask for your surgeon's nurse.  If you have other questions about your diagnosis, plan, or surgery, call the office and ask for your surgeon's nurse.  We discussed the staging and pathophysiology of breast cancer. We discussed all of the different options for treatment for breast cancer including surgery, chemotherapy, radiation therapy, Herceptin, and antiestrogen therapy. We discussed a sentinel lymph node biopsy as she does not appear to having lymph node involvement right now. We discussed  the performance of that with injection of radioactive tracer and blue dye. We discussed that she would have an incision underneath her axillary hairline. We discussed that there is a bout a 10-20% chance of having a positive node with a sentinel lymph node biopsy and we will await the permanent pathology to make any other first further decisions in terms of her treatment. One of these options might be to return to the operating room to perform an axillary lymph node dissection. We discussed about a 1-2% risk lifetime of chronic shoulder pain as well as lymphedema associated with a sentinel lymph node biopsy. We discussed the options for treatment of the breast cancer which included lumpectomy versus a mastectomy. We discussed the performance of the lumpectomy with a wire placement. We discussed a 10-20% chance of a positive margin requiring reexcision in the operating room. We also discussed that she may need radiation therapy or antiestrogen therapy or both if she undergoes lumpectomy. We discussed the mastectomy and the postoperative care for that as well. We discussed that there is no difference in her survival whether she undergoes lumpectomy with radiation therapy or antiestrogen therapy versus a mastectomy. There is a slight difference in the local recurrence rate being 3-5% with lumpectomy and about 1% with a mastectomy. We discussed the risks of operation including bleeding, infection, possible reoperation. She understands her further therapy will be based on what her stages at the time of her operation.  Pt Education - [facility] breast cancer surgery: discussed with patient and provided information. Pt Education - [facility] Breast Biopsy HCI: discussed with patient and provided information. Pt Education - ABC (After Breast Cancer) Class Info: discussed with patient and provided information.

## 2017-05-03 ENCOUNTER — Telehealth: Payer: Self-pay | Admitting: *Deleted

## 2017-05-03 NOTE — Telephone Encounter (Signed)
Spoke to pt regarding [place] from 05/02/17. Denies questions or concerns regarding dx or treatment care plan. Encourage pt to call with needs. Received verbal understanding. Contact information provided. Pt relate she will need to cx genetics today d/t work. Scheduled and confirmed new appt for 06/11/17 at 4pm.

## 2017-05-04 ENCOUNTER — Telehealth: Payer: Self-pay | Admitting: Hematology and Oncology

## 2017-05-04 NOTE — Telephone Encounter (Signed)
No 12/3 los.   

## 2017-05-08 ENCOUNTER — Other Ambulatory Visit: Payer: Self-pay | Admitting: Surgery

## 2017-05-08 DIAGNOSIS — D0512 Intraductal carcinoma in situ of left breast: Secondary | ICD-10-CM

## 2017-05-09 ENCOUNTER — Encounter: Payer: Self-pay | Admitting: Family Medicine

## 2017-05-09 ENCOUNTER — Encounter: Payer: Self-pay | Admitting: Radiation Oncology

## 2017-05-09 ENCOUNTER — Other Ambulatory Visit: Payer: Self-pay | Admitting: *Deleted

## 2017-05-09 ENCOUNTER — Ambulatory Visit: Payer: Medicare HMO | Admitting: Family Medicine

## 2017-05-09 VITALS — BP 140/90 | HR 80 | Wt 207.8 lb

## 2017-05-09 DIAGNOSIS — M810 Age-related osteoporosis without current pathological fracture: Secondary | ICD-10-CM | POA: Diagnosis not present

## 2017-05-09 DIAGNOSIS — I10 Essential (primary) hypertension: Secondary | ICD-10-CM

## 2017-05-09 DIAGNOSIS — E782 Mixed hyperlipidemia: Secondary | ICD-10-CM

## 2017-05-09 DIAGNOSIS — E119 Type 2 diabetes mellitus without complications: Secondary | ICD-10-CM | POA: Diagnosis not present

## 2017-05-09 DIAGNOSIS — E1165 Type 2 diabetes mellitus with hyperglycemia: Secondary | ICD-10-CM | POA: Diagnosis not present

## 2017-05-09 DIAGNOSIS — D0512 Intraductal carcinoma in situ of left breast: Secondary | ICD-10-CM

## 2017-05-09 DIAGNOSIS — IMO0001 Reserved for inherently not codable concepts without codable children: Secondary | ICD-10-CM

## 2017-05-09 LAB — POCT GLYCOSYLATED HEMOGLOBIN (HGB A1C): Hemoglobin A1C: 8.5

## 2017-05-09 MED ORDER — ALENDRONATE SODIUM 70 MG PO TABS: 70.0000 mg | ORAL_TABLET | ORAL | 11 refills | Status: DC

## 2017-05-09 NOTE — Progress Notes (Signed)
Subjective:    Patient ID: [name], female    DOB: 08-18-1945, 72 y.o.   MRN: [id number]  [name] is a 72 y.o. female who presents for follow-up of Type 2 diabetes mellitus as well as other chronic health condition. Newly diagnosed osteoporosis per DEXA.  Recently diagnosed with breast cancer and has an upcoming surgery.  States recent increased stress and thinks this is causing blood sugars to be higher than usual.   Patient is checking home blood sugars.   Home blood sugar records:lowest 145- highest 197 How often is blood sugars being checked: once a day Current symptoms include: none. Patient denies nausea, visual disturbances, vomiting and weight loss.  Patient is checking their feet daily. Any Foot concerns (callous, ulcer, wound, thickened nails, toenail fungus, skin fungus, hammer toe): none  Last dilated eye exam: November 24th 2018  Current treatments: none. Medication compliance: good  Current diet: in general, a "healthy" diet   Current exercise: walking Known diabetic complications: none  The following portions of the patient's history were reviewed and updated as appropriate: allergies, current medications, past medical history, past social history and problem list.  ROS as in subjective above.     Objective:    Physical Exam Alert and in no distress otherwise not examined.  Blood pressure 140/90, pulse 80, weight 207 lb 12.8 oz (94.3 kg).  Lab Review Diabetic Labs Latest Ref Rng & Units 05/09/2017 02/05/2017 10/09/2016 02/11/2016 10/06/2008  HbA1c - 8.5% 7.3% 10.1% 12.4% 8.0(H)  Microalbumin Not estab mg/dL - - - 3.6 -  Micro/Creat Ratio <30 mcg/mg creat - - - 27 -  Chol <200 mg/dL - 160 205(H) 214(H) -  HDL >50 mg/dL - 42(L) 41(L) 40(L) -  Calc LDL <100 mg/dL - - 124(H) 139(H) -  Triglycerides <150 mg/dL - 157(H) 198(H) 177(H) -  Creatinine 0.50 - 0.99 mg/dL - - 0.57 0.63 -   BP/Weight 05/09/2017 05/02/2017 02/05/2017 11/13/2016 11/10/9935    Systolic BP 169 678 938 101 751  Diastolic BP 90 83 70 82 70  Wt. (Lbs) 207.8 206.7 203.8 201.2 202.4  BMI 34.58 34.4 33.91 33.48 33.68   Foot/eye exam completion dates 02/05/2017  Foot Form Completion Done    [name]  reports that  has never smoked. she has never used smokeless tobacco. She reports that she does not drink alcohol or use drugs.     Assessment & Plan:    Controlled type 2 diabetes mellitus without complication, without long-term current use of insulin ([place]) - Plan: HgB A1c  Essential hypertension  Mixed hyperlipidemia  Age-related osteoporosis without current pathological fracture - Plan: alendronate (FOSAMAX) 70 MG tablet  Uncontrolled diabetes mellitus type 2 without complications ([place]) - Plan: Microalbumin / creatinine urine ratio  1. Rx changes: none Hgb A1c increased to 8.5%. Does not want to add a third medication today. Would like to see how her BS does over the next month. Recent diagnosis of breast cancer has increased her stress level and she is soon to get the plan of care for this.  2. HTN- BP is above goal. She is aware and will check her BP at home and let me know if her readings are staying elevated.  3. Education: Reviewed 'ABCs' of diabetes management (respective goals in parentheses):  A1C (<7), blood pressure (<130/80), and cholesterol (LDL <100). 4. Compliance at present is estimated to be good. Efforts to improve compliance (if necessary) will be directed at dietary modifications: cut back on carbs  and sugar, increased exercise and regular blood sugar monitoring: daily. 5. Recent DEXA showed osteoporosis. Discussed management and treatment options. She prefers to start on oral alendronate. Instructions given. I will see her back in 4 weeks to see how she is doing on this medication. She may want to try Prolia if this is not going well.  6. Follow up: 1 month

## 2017-05-09 NOTE — Patient Instructions (Signed)
You have osteoporosis and I as discussed, I am starting you on an oral medication for this. Continue getting adequate calcium (1,200 mg) in your diet and taking vitamin D 1,000 IU daily. Weight bearing exercises will help also.    Your hemoglobin A1c is 8.5%   Continue keeping an eye on your blood sugars at home. If you are seeing high readings >130 fasting on a regular basis then let me know.   Also, your BP is 140/90 today and goal BP is <130/80. Keep an eye on your BP and bring in your readings.    Osteoporosis Osteoporosis happens when your bones become thinner and weaker. Weak bones can break (fracture) more easily when you slip or fall. Bones most at risk of breaking are in the hip, wrist, and spine. Follow these instructions at home:  Get enough calcium and vitamin D. These nutrients are good for your bones.  Exercise as told by your doctor.  Do not use any tobacco products. This includes cigarettes, chewing tobacco, and electronic cigarettes. If you need help quitting, ask your doctor.  Limit the amount of alcohol you drink.  Take medicines only as told by your doctor.  Keep all follow-up visits as told by your doctor. This is important.  Take care at home to prevent falls. Some ways to do this are: ? Keep rooms well lit and tidy. ? Put safety rails on your stairs. ? Put a rubber mat in the bathroom and other places that are often wet or slippery. Get help right away if:  You fall.  You hurt yourself. This information is not intended to replace advice given to you by your health care provider. Make sure you discuss any questions you have with your health care provider. Document Released: 06/19/2011 Document Revised: 09/02/2015 Document Reviewed: 09/04/2013 Elsevier Interactive Patient Education  2018 [employer].  Alendronate; Cholecalciferol tablets What is this medicine? ALENDRONATE; CHOLECALCIFEROL (a LEN droe nate; KOL e cal SIF er ol) has two medicines to help  reduce calcium loss from bones and to increase the production of normal, healthy bone in patients with osteoporosis. This medicine may be used for other purposes; ask your health care provider or pharmacist if you have questions. COMMON BRAND NAME(S): Fosamax Plus D What should I tell my health care provider before I take this medicine? They need to know if you have any of these conditions: - dental disease -kidney disease -low level or high level of blood calcium -problems sitting or standing for 30 minutes -problems swallowing -stomach, intestine, or esophagus problems like acid reflux or GERD -vitamin D toxicity -an unusual or allergic reaction to alendronate, cholecalciferol, medicines, foods, lactose, dyes, or preservatives -pregnant or trying to get pregnant -breast-feeding How should I use this medicine? You must take this medicine exactly as directed or you will lower the amount of medicine you absorb into your body or you may cause yourself harm. Take your dose by mouth first thing in the morning, after you are up for the day. Do not eat or drink anything before you take this medicine. Swallow your medicine with a full glass (6 to 8 fluid ounces) of plain water. Do not take this tablet with any with any other drink. Follow the directions on the prescription label. After taking this medicine, do not eat breakfast, drink, or take any medicines or vitamins for at least 30 minutes. Stand or sit up for at least 30 minutes after you take this medicine; do not lie down.  Take the medicine on the same day every week. Do not take your medicine more often than directed. Do not stop taking except on your doctor's advice. Talk to your pediatrician regarding the use of this medicine in children. Special care may be needed. Overdosage: If you think you have taken too much of this medicine contact a poison control center or emergency room at once. NOTE: This medicine is only for you. Do not share this  medicine with others. What if I miss a dose? If you miss a dose, take the dose on the morning after you remember. Take your next dose on your regular chosen day of the week, but do not take 2 tablets on the same day. Do not take double or extra doses. What may interact with this medicine? -antacids -aspirin and aspirin like drugs -calcium supplements, especially calcium with vitamin D -cholestyramine or colestipol -cimetidine, ranitidine, or other medicines used to decrease stomach acid -corticosteroids -iron supplements -magnesium supplements -mineral oil -NSAIDs, medicines for pain and inflammation, like ibuprofen or naproxen -orlistat -phenobarbital -phenytoin -phosphorous supplements -primidone -steroid medicines like prednisone or cortisone -thiazide diuretics -vitamins with minerals, especially with vitamin D This list may not describe all possible interactions. Give your health care provider a list of all the medicines, herbs, non-prescription drugs, or dietary supplements you use. Also tell them if you smoke, drink alcohol, or use illegal drugs. Some items may interact with your medicine. What should I watch for while using this medicine? Visit your doctor or health care professional for regular checkups. It may be some time before you see the benefit from this medicine. Do not stop taking your medicine unless your doctor tells you to. Your doctor may order blood tests or other tests to see how you are doing. You should make sure that you get enough calcium and vitamin D while you are taking this medicine. Discuss the foods you eat and the vitamins you take with your health care professional. If you have pain when swallowing, difficulty swallowing, heartburn, or stomach pain, immediately call your doctor or health care professional. If you are taking an antacid, a mineral supplement like calcium or iron, or a vitamin with minerals, wait to take them at least 30 minutes after you  take this medicine. Do not take them at same time. This medicine can make you more sensitive to the sun. If you get a rash while taking this medicine, sunlight may cause the rash to get worse. Keep out of the sun. If you cannot avoid being in the sun, wear protective clothing and use sunscreen. Do not use sun lamps or tanning beds/booths. Some people who take this medicine have severe bone, joint, and/or muscle pain. This medicine may also increase your risk for a broken thigh bone. Tell your doctor right away if you have pain in your upper leg or groin. Tell your doctor if you have any pain that does not go away or that gets worse. What side effects may I notice from receiving this medicine? Side effects that you should report to your doctor or health care professional as soon as possible: -allergic reactions like skin rash, itching or hives, swelling of the face, lips, or tongue -bone, muscle, or joint pain -changes in vision -heartburn or chest pain -pain or difficulty swallowing -redness, blistering, peeling or loosening of the skin, including inside the mouth -stomach pain -unusual bleeding or bruising -unusually weak or tired -vomiting Side effects that usually do not require medical attention (report to  your doctor or health care professional if they continue or are bothersome): -diarrhea or constipation -headache -nausea -stomach gas or fullness This list may not describe all possible side effects. Call your doctor for medical advice about side effects. You may report side effects to FDA at 1-800-FDA-1088. Where should I keep my medicine? Keep out of the reach of children. Store at room temperature between 20 and 25 degrees C (68 and 77 degrees F). Protect the medicine from moisture and light. Throw away any unused medicine after the expiration date. NOTE: This sheet is a summary. It may not cover all possible information. If you have questions about this medicine, talk to your  doctor, pharmacist, or health care provider.  2018 Elsevier/Gold Standard (2010-09-23 08:57:31)

## 2017-05-09 NOTE — Addendum Note (Signed)
Addended by: Minette Headland A on: 05/09/2017 09:11 AM   Modules accepted: Orders

## 2017-05-10 ENCOUNTER — Telehealth: Payer: Self-pay | Admitting: Hematology and Oncology

## 2017-05-10 LAB — MICROALBUMIN / CREATININE URINE RATIO
CREATININE, UR: 77.4 mg/dL
Microalb/Creat Ratio: 26.7 mg/g creat (ref 0.0–30.0)
Microalbumin, Urine: 20.7 ug/mL

## 2017-05-10 NOTE — Telephone Encounter (Signed)
Spoke to patient regarding upcoming February appointments per 1/30 sch message.

## 2017-05-24 ENCOUNTER — Encounter (HOSPITAL_COMMUNITY)
Admission: RE | Admit: 2017-05-24 | Discharge: 2017-05-24 | Disposition: A | Discharge status: Home / Self Care | Payer: Medicare HMO | Source: Ambulatory Visit | Attending: Surgery | Admitting: Surgery

## 2017-05-24 ENCOUNTER — Encounter (HOSPITAL_COMMUNITY): Payer: Self-pay | Admitting: *Deleted

## 2017-05-24 DIAGNOSIS — E785 Hyperlipidemia, unspecified: Secondary | ICD-10-CM | POA: Diagnosis not present

## 2017-05-24 DIAGNOSIS — Z7984 Long term (current) use of oral hypoglycemic drugs: Secondary | ICD-10-CM | POA: Diagnosis not present

## 2017-05-24 DIAGNOSIS — Z01812 Encounter for preprocedural laboratory examination: Secondary | ICD-10-CM | POA: Insufficient documentation

## 2017-05-24 DIAGNOSIS — D0512 Intraductal carcinoma in situ of left breast: Secondary | ICD-10-CM

## 2017-05-24 DIAGNOSIS — Z7982 Long term (current) use of aspirin: Secondary | ICD-10-CM | POA: Insufficient documentation

## 2017-05-24 DIAGNOSIS — Z9889 Other specified postprocedural states: Secondary | ICD-10-CM | POA: Insufficient documentation

## 2017-05-24 DIAGNOSIS — Z79899 Other long term (current) drug therapy: Secondary | ICD-10-CM | POA: Insufficient documentation

## 2017-05-24 DIAGNOSIS — Z01810 Encounter for preprocedural cardiovascular examination: Secondary | ICD-10-CM | POA: Diagnosis not present

## 2017-05-24 DIAGNOSIS — I10 Essential (primary) hypertension: Secondary | ICD-10-CM | POA: Insufficient documentation

## 2017-05-24 DIAGNOSIS — K219 Gastro-esophageal reflux disease without esophagitis: Secondary | ICD-10-CM | POA: Diagnosis not present

## 2017-05-24 DIAGNOSIS — E119 Type 2 diabetes mellitus without complications: Secondary | ICD-10-CM | POA: Diagnosis not present

## 2017-05-24 HISTORY — DX: Unspecified osteoarthritis, unspecified site: M19.90

## 2017-05-24 HISTORY — DX: Other complications of anesthesia, initial encounter: T88.59XA

## 2017-05-24 HISTORY — DX: Adverse effect of unspecified anesthetic, initial encounter: T41.45XA

## 2017-05-24 LAB — BASIC METABOLIC PANEL
ANION GAP: 13 (ref 5–15)
BUN: 10 mg/dL (ref 6–20)
CALCIUM: 9.1 mg/dL (ref 8.9–10.3)
CO2: 20 mmol/L — ABNORMAL LOW (ref 22–32)
CREATININE: 0.65 mg/dL (ref 0.44–1.00)
Chloride: 107 mmol/L (ref 101–111)
GFR calc Af Amer: >60 mL/min (ref >60)
GLUCOSE: 154 mg/dL — AB (ref 65–99)
Potassium: 4.1 mmol/L (ref 3.5–5.1)
Sodium: 140 mmol/L (ref 135–145)

## 2017-05-24 LAB — CBC
HCT: 39.6 % (ref 36.0–46.0)
Hemoglobin: 13 g/dL (ref 12.0–15.0)
MCH: 27 pg (ref 26.0–34.0)
MCHC: 32.8 g/dL (ref 30.0–36.0)
MCV: 82.3 fL (ref 78.0–100.0)
Platelets: 343 10*3/uL (ref 150–400)
RBC: 4.81 MIL/uL (ref 3.87–5.11)
RDW: 14.4 % (ref 11.5–15.5)
WBC: 11.9 10*3/uL — AB (ref 4.0–10.5)

## 2017-05-24 LAB — GLUCOSE, CAPILLARY: Glucose-Capillary: 136 mg/dL — ABNORMAL HIGH (ref 65–99)

## 2017-05-24 MED ORDER — CHLORHEXIDINE GLUCONATE CLOTH 2 % EX PADS: 6.0000 | MEDICATED_PAD | Freq: Once | CUTANEOUS | Status: DC

## 2017-05-24 NOTE — Pre-Procedure Instructions (Signed)
    [name] [facility]  05/24/2017      [id number] [name], [place] [address] Phone: [phone] Fax: [phone]    Your procedure is scheduled on 05/30/17.  Report to [facility] Admitting at 830 A.M.  Call this number if you have problems the morning of surgery:  [phone]   Remember:  Do not eat food or drink liquids after midnight.  Take these medicines the morning of surgery with A SIP OF WATER ---metroprolol   Do not wear jewelry, make-up or nail polish.  Do not wear lotions, powders, or perfumes, or deodorant.  Do not shave 48 hours prior to surgery.  Men may shave face and neck.  Do not bring valuables to the hospital.  [facility] is not responsible for any belongings or valuables.  Contacts, dentures or bridgework may not be worn into surgery.  Leave your suitcase in the car.  After surgery it may be brought to your room.  For patients admitted to the hospital, discharge time will be determined by your treatment team.  Patients discharged the day of surgery will not be allowed to drive home.   Name and phone number of your driver:   Do not take any aspirin,anti-inflammatories,vitamins,or herbal supplements 5-7 days prior to surgery. Special instructions:   DRINK ALL OF ENDURE DAY OF SURGERY BEFORE LEAVING YOUR HOUSE Please read over the following fact sheets that you were given.

## 2017-05-28 ENCOUNTER — Ambulatory Visit
Admission: RE | Admit: 2017-05-28 | Discharge: 2017-05-28 | Disposition: A | Discharge status: Home / Self Care | Payer: Medicare HMO | Source: Ambulatory Visit | Attending: Surgery | Admitting: Surgery

## 2017-05-28 ENCOUNTER — Other Ambulatory Visit: Payer: Self-pay | Admitting: Family Medicine

## 2017-05-28 DIAGNOSIS — I10 Essential (primary) hypertension: Secondary | ICD-10-CM

## 2017-05-28 DIAGNOSIS — D0512 Intraductal carcinoma in situ of left breast: Secondary | ICD-10-CM

## 2017-05-28 MED ORDER — METOPROLOL TARTRATE 25 MG PO TABS: 25.0000 mg | ORAL_TABLET | Freq: Two times a day (BID) | ORAL | 1 refills | Status: DC

## 2017-05-28 NOTE — Progress Notes (Signed)
Anesthesia Chart Review: Patient is a 72 year old female scheduled for left breast lumpectomy with radioactive seed localization on 05/30/17 by Dr. Erroll Luna.   Patient presented to the [facility] on 05/28/17 for radioactive seed implant. I was called by Chrissy for BP readings of 206/85 and 185/88 (~ 30 minutes later). Patient is taking Lopressor 25 mg daily and took her dose this morning at home. BP was 183/80 at PAT 05/24/17 and 140/90 on 05/09/17. Patient reported that home BP as low as 120/70, but some other SBP ~ 140. She does admit that when she is upset she has taken a home SBP on occasion up to 180. She has been stressed about surgery, but otherwise feels at her baseline. She denied CP, SOB, swelling, syncope.    History includes left breast DCIS, diabetes mellitus type 2, hypertension, hyperlipidemia, GERD, appendectomy, tonsillectomy. BMI is consistent with obesity.   PCP is [name], NP-C, last visit 05/09/17. HEM-ONC is Dr. Nicholas Lose. RAD-ONC is Dr. Eppie Gibson.  Meds include Fosamax, aspirin 325 mg (on hold), glipizide, metformin, metoprolol tartrate 25 mg daily, calcium, Saffron extract, pravastatin, vitamin E.  BP (!) 183/80   Pulse 90   Temp 36.7 C   Resp 20   Ht 5\' 5"  (1.651 m)   Wt 207 lb 3.2 oz (94 kg)   SpO2 98%   BMI 34.48 kg/m   EKG 05/1417: NSR.  Echo 11/28/07: SUMMARY - Overall left ventricular systolic function was normal. Left    ventricular ejection fraction was estimated , range being 55    % to 65 %. There were no left ventricular regional wall    motion abnormalities. - There was mild mitral annular calcification.   Preoperative labs noted. Cr 0.65. WBC 11.9. H/H 13.0/39.6. PLT 343. Glucose 154. A1c 8.5% on 05/09/17.  Patient has had variable BP readings and most elevated around her breast surgery, so question if stress and worry of surgery are playing at least some role. Patient denied cardiopulmonary symptoms. Reviewed with  anesthesiologist Dr. Lillia Abed. Plan for radioactive seed to be placed as planned 05/28/17 but with recommendation to touch base with PCP regarding possible medication adjustments prior to surgery. I called and spoke with [name], NP-C. Current plan is for patient to increase Lopressor to 25 mg po BID. Patient notified. Hopefully this will improve BP control prior to surgery; however patient was also told to contact PCP if she develops dizziness or low BP readings at home on her new regimen. [name] also plans to contact patient and have her seen for re-evaluation within the next few weeks. Patient told to take Lopressor am dose with sip of water on the day of surgery. She will get vitals on arrival and additional preoperative treatment for HTN if indicated. Chrissy at [facility] was also working on updating Dr. Brantley Stage.  [facility]/Anesthesiology Phone [phone] 05/28/2017 4:15 PM

## 2017-05-29 ENCOUNTER — Telehealth: Payer: Self-pay | Admitting: Internal Medicine

## 2017-05-29 MED ORDER — DEXTROSE 5 % IV SOLN
3.0000 g | INTRAVENOUS | Status: AC
  Administered 2017-05-30: 3 g via INTRAVENOUS
  Filled 2017-05-29: qty 3000

## 2017-05-29 NOTE — Telephone Encounter (Signed)
Pt bp was 140/70 this am. Pt states she gets worked up over having this surgery and thinks that's why her bp was elevated. [name] was informed

## 2017-05-30 ENCOUNTER — Ambulatory Visit (HOSPITAL_COMMUNITY): Payer: Medicare HMO | Admitting: Vascular Surgery

## 2017-05-30 ENCOUNTER — Ambulatory Visit (HOSPITAL_COMMUNITY): Payer: Medicare HMO | Admitting: Critical Care Medicine

## 2017-05-30 ENCOUNTER — Encounter (HOSPITAL_COMMUNITY)
Admission: RE | Disposition: A | Discharge status: Home / Self Care | Payer: Self-pay | Source: Ambulatory Visit | Attending: Surgery

## 2017-05-30 ENCOUNTER — Ambulatory Visit (HOSPITAL_COMMUNITY)
Admission: RE | Admit: 2017-05-30 | Discharge: 2017-05-30 | Disposition: A | Discharge status: Home / Self Care | Payer: Medicare HMO | Source: Ambulatory Visit | Attending: Surgery | Admitting: Surgery

## 2017-05-30 ENCOUNTER — Ambulatory Visit
Admission: RE | Admit: 2017-05-30 | Discharge: 2017-05-30 | Disposition: A | Discharge status: Home / Self Care | Payer: Commercial Managed Care - HMO | Source: Ambulatory Visit | Attending: Surgery | Admitting: Surgery

## 2017-05-30 ENCOUNTER — Encounter (HOSPITAL_COMMUNITY): Payer: Self-pay | Admitting: Critical Care Medicine

## 2017-05-30 DIAGNOSIS — G709 Myoneural disorder, unspecified: Secondary | ICD-10-CM | POA: Insufficient documentation

## 2017-05-30 DIAGNOSIS — I10 Essential (primary) hypertension: Secondary | ICD-10-CM | POA: Diagnosis not present

## 2017-05-30 DIAGNOSIS — D0512 Intraductal carcinoma in situ of left breast: Secondary | ICD-10-CM | POA: Insufficient documentation

## 2017-05-30 DIAGNOSIS — D242 Benign neoplasm of left breast: Secondary | ICD-10-CM | POA: Diagnosis not present

## 2017-05-30 DIAGNOSIS — Z170 Estrogen receptor positive status [ER+]: Secondary | ICD-10-CM | POA: Diagnosis not present

## 2017-05-30 DIAGNOSIS — N6022 Fibroadenosis of left breast: Secondary | ICD-10-CM | POA: Diagnosis not present

## 2017-05-30 DIAGNOSIS — K219 Gastro-esophageal reflux disease without esophagitis: Secondary | ICD-10-CM | POA: Insufficient documentation

## 2017-05-30 DIAGNOSIS — F329 Major depressive disorder, single episode, unspecified: Secondary | ICD-10-CM | POA: Diagnosis not present

## 2017-05-30 DIAGNOSIS — E118 Type 2 diabetes mellitus with unspecified complications: Secondary | ICD-10-CM | POA: Diagnosis not present

## 2017-05-30 DIAGNOSIS — M1990 Unspecified osteoarthritis, unspecified site: Secondary | ICD-10-CM | POA: Diagnosis not present

## 2017-05-30 DIAGNOSIS — N6012 Diffuse cystic mastopathy of left breast: Secondary | ICD-10-CM | POA: Diagnosis not present

## 2017-05-30 DIAGNOSIS — E119 Type 2 diabetes mellitus without complications: Secondary | ICD-10-CM | POA: Diagnosis not present

## 2017-05-30 DIAGNOSIS — C50412 Malignant neoplasm of upper-outer quadrant of left female breast: Secondary | ICD-10-CM | POA: Diagnosis not present

## 2017-05-30 HISTORY — PX: BREAST LUMPECTOMY WITH RADIOACTIVE SEED LOCALIZATION: SHX6424

## 2017-05-30 HISTORY — PX: BREAST LUMPECTOMY: SHX2

## 2017-05-30 LAB — GLUCOSE, CAPILLARY
GLUCOSE-CAPILLARY: 219 mg/dL — AB (ref 65–99)
GLUCOSE-CAPILLARY: 168 mg/dL — AB (ref 65–99)

## 2017-05-30 SURGERY — BREAST LUMPECTOMY WITH RADIOACTIVE SEED LOCALIZATION
Anesthesia: General | Site: Breast | Laterality: Left

## 2017-05-30 MED ORDER — OXYCODONE HCL 5 MG PO TABS
ORAL_TABLET | ORAL | Status: AC
  Filled 2017-05-30: qty 1

## 2017-05-30 MED ORDER — MIDAZOLAM HCL 2 MG/2ML IJ SOLN
INTRAMUSCULAR | Status: AC
  Filled 2017-05-30: qty 2

## 2017-05-30 MED ORDER — ONDANSETRON HCL 4 MG/2ML IJ SOLN: 4.0000 mg | Freq: Four times a day (QID) | INTRAMUSCULAR | Status: DC | PRN

## 2017-05-30 MED ORDER — OXYCODONE HCL 5 MG PO TABS
5.0000 mg | ORAL_TABLET | Freq: Four times a day (QID) | ORAL | 0 refills | Status: DC | PRN
Start: 2017-05-30 — End: 2017-06-06

## 2017-05-30 MED ORDER — GABAPENTIN 300 MG PO CAPS
ORAL_CAPSULE | ORAL | Status: AC
  Administered 2017-05-30: 300 mg via ORAL
  Filled 2017-05-30: qty 1

## 2017-05-30 MED ORDER — LIDOCAINE 2% (20 MG/ML) 5 ML SYRINGE
INTRAMUSCULAR | Status: AC
  Filled 2017-05-30: qty 10

## 2017-05-30 MED ORDER — ONDANSETRON HCL 4 MG/2ML IJ SOLN
INTRAMUSCULAR | Status: AC
  Filled 2017-05-30: qty 2

## 2017-05-30 MED ORDER — ONDANSETRON HCL 4 MG/2ML IJ SOLN
INTRAMUSCULAR | Status: DC | PRN
  Administered 2017-05-30: 4 mg via INTRAVENOUS

## 2017-05-30 MED ORDER — DEXAMETHASONE SODIUM PHOSPHATE 10 MG/ML IJ SOLN
INTRAMUSCULAR | Status: AC
  Filled 2017-05-30: qty 1

## 2017-05-30 MED ORDER — PROPOFOL 10 MG/ML IV BOLUS
INTRAVENOUS | Status: AC
  Filled 2017-05-30: qty 20

## 2017-05-30 MED ORDER — BUPIVACAINE-EPINEPHRINE 0.25% -1:200000 IJ SOLN
INTRAMUSCULAR | Status: AC
  Filled 2017-05-30: qty 1

## 2017-05-30 MED ORDER — PHENYLEPHRINE HCL 10 MG/ML IJ SOLN
INTRAMUSCULAR | Status: DC | PRN
  Administered 2017-05-30: 80 ug via INTRAVENOUS

## 2017-05-30 MED ORDER — 0.9 % SODIUM CHLORIDE (POUR BTL) OPTIME
TOPICAL | Status: DC | PRN
  Administered 2017-05-30: 1000 mL

## 2017-05-30 MED ORDER — SUCCINYLCHOLINE CHLORIDE 200 MG/10ML IV SOSY
PREFILLED_SYRINGE | INTRAVENOUS | Status: AC
  Filled 2017-05-30: qty 10

## 2017-05-30 MED ORDER — LIDOCAINE 2% (20 MG/ML) 5 ML SYRINGE
INTRAMUSCULAR | Status: DC | PRN
  Administered 2017-05-30: 60 mg via INTRAVENOUS

## 2017-05-30 MED ORDER — FENTANYL CITRATE (PF) 250 MCG/5ML IJ SOLN
INTRAMUSCULAR | Status: DC | PRN
  Administered 2017-05-30: 100 ug via INTRAVENOUS
  Administered 2017-05-30: 25 ug via INTRAVENOUS

## 2017-05-30 MED ORDER — CELECOXIB 200 MG PO CAPS
200.0000 mg | ORAL_CAPSULE | ORAL | Status: AC
  Administered 2017-05-30: 200 mg via ORAL

## 2017-05-30 MED ORDER — FENTANYL CITRATE (PF) 100 MCG/2ML IJ SOLN
INTRAMUSCULAR | Status: AC
  Filled 2017-05-30: qty 2

## 2017-05-30 MED ORDER — MIDAZOLAM HCL 5 MG/5ML IJ SOLN
INTRAMUSCULAR | Status: DC | PRN
  Administered 2017-05-30: 2 mg via INTRAVENOUS

## 2017-05-30 MED ORDER — CELECOXIB 200 MG PO CAPS
ORAL_CAPSULE | ORAL | Status: AC
  Administered 2017-05-30: 200 mg via ORAL
  Filled 2017-05-30: qty 1

## 2017-05-30 MED ORDER — PROPOFOL 10 MG/ML IV BOLUS
INTRAVENOUS | Status: DC | PRN
  Administered 2017-05-30: 150 mg via INTRAVENOUS

## 2017-05-30 MED ORDER — GABAPENTIN 300 MG PO CAPS
300.0000 mg | ORAL_CAPSULE | ORAL | Status: AC
  Administered 2017-05-30: 300 mg via ORAL

## 2017-05-30 MED ORDER — OXYCODONE HCL 5 MG PO TABS
5.0000 mg | ORAL_TABLET | Freq: Once | ORAL | Status: AC | PRN
  Administered 2017-05-30: 5 mg via ORAL

## 2017-05-30 MED ORDER — ACETAMINOPHEN 500 MG PO TABS
ORAL_TABLET | ORAL | Status: AC
  Filled 2017-05-30: qty 2

## 2017-05-30 MED ORDER — FENTANYL CITRATE (PF) 250 MCG/5ML IJ SOLN
INTRAMUSCULAR | Status: AC
  Filled 2017-05-30: qty 5

## 2017-05-30 MED ORDER — EPHEDRINE SULFATE-NACL 50-0.9 MG/10ML-% IV SOSY
PREFILLED_SYRINGE | INTRAVENOUS | Status: DC | PRN
  Administered 2017-05-30 (×4): 5 mg via INTRAVENOUS

## 2017-05-30 MED ORDER — OXYCODONE HCL 5 MG/5ML PO SOLN: 5.0000 mg | Freq: Once | ORAL | Status: AC | PRN

## 2017-05-30 MED ORDER — FENTANYL CITRATE (PF) 100 MCG/2ML IJ SOLN: 25.0000 ug | INTRAMUSCULAR | Status: DC | PRN

## 2017-05-30 MED ORDER — LACTATED RINGERS IV SOLN
INTRAVENOUS | Status: DC
  Administered 2017-05-30: 09:00:00 via INTRAVENOUS

## 2017-05-30 MED ORDER — IBUPROFEN 800 MG PO TABS: 800.0000 mg | ORAL_TABLET | Freq: Three times a day (TID) | ORAL | 0 refills | Status: DC | PRN

## 2017-05-30 MED ORDER — BUPIVACAINE-EPINEPHRINE 0.25% -1:200000 IJ SOLN
INTRAMUSCULAR | Status: DC | PRN
  Administered 2017-05-30: 20 mL

## 2017-05-30 MED ORDER — ROCURONIUM BROMIDE 10 MG/ML (PF) SYRINGE
PREFILLED_SYRINGE | INTRAVENOUS | Status: AC
  Filled 2017-05-30: qty 5

## 2017-05-30 MED ORDER — PHENYLEPHRINE 40 MCG/ML (10ML) SYRINGE FOR IV PUSH (FOR BLOOD PRESSURE SUPPORT)
PREFILLED_SYRINGE | INTRAVENOUS | Status: AC
  Filled 2017-05-30: qty 30

## 2017-05-30 MED ORDER — ACETAMINOPHEN 500 MG PO TABS
1000.0000 mg | ORAL_TABLET | ORAL | Status: AC
  Administered 2017-05-30: 1000 mg via ORAL

## 2017-05-30 SURGICAL SUPPLY — 53 items
ADH SKN CLS APL DERMABOND .7 (GAUZE/BANDAGES/DRESSINGS) ×1
APPLIER CLIP 9.375 MED OPEN (MISCELLANEOUS)
BINDER BREAST LRG (GAUZE/BANDAGES/DRESSINGS) IMPLANT
BINDER BREAST XLRG (GAUZE/BANDAGES/DRESSINGS) ×3 IMPLANT
BLADE SURG 15 STRL LF DISP TIS (BLADE) ×1 IMPLANT
BLADE SURG 15 STRL SS (BLADE) ×2
CANISTER SUCT 3000ML PPV (MISCELLANEOUS) IMPLANT
CHLORAPREP W/TINT 26ML (MISCELLANEOUS) ×3 IMPLANT
CLIP APPLIE 9.375 MED OPEN (MISCELLANEOUS) IMPLANT
COVER LIGHT HANDLE STERIS (MISCELLANEOUS) ×3 IMPLANT
COVER PROBE W GEL 5X96 (DRAPES) ×3 IMPLANT
DERMABOND ADVANCED (GAUZE/BANDAGES/DRESSINGS) ×2
DERMABOND ADVANCED .7 DNX12 (GAUZE/BANDAGES/DRESSINGS) ×1 IMPLANT
DEVICE DUBIN SPECIMEN MAMMOGRA (MISCELLANEOUS) ×3 IMPLANT
DRAPE CHEST BREAST 15X10 FENES (DRAPES) ×3 IMPLANT
DRAPE UTILITY XL STRL (DRAPES) ×3 IMPLANT
ELECT CAUTERY BLADE 6.4 (BLADE) ×3 IMPLANT
ELECT REM PT RETURN 9FT ADLT (ELECTROSURGICAL) ×3
ELECTRODE REM PT RTRN 9FT ADLT (ELECTROSURGICAL) ×1 IMPLANT
GLOVE BIO SURGEON STRL SZ8 (GLOVE) ×3 IMPLANT
GLOVE BIOGEL PI IND STRL 6 (GLOVE) ×1 IMPLANT
GLOVE BIOGEL PI IND STRL 6.5 (GLOVE) ×1 IMPLANT
GLOVE BIOGEL PI IND STRL 7.0 (GLOVE) ×1 IMPLANT
GLOVE BIOGEL PI IND STRL 8 (GLOVE) ×1 IMPLANT
GLOVE BIOGEL PI INDICATOR 6 (GLOVE) ×2
GLOVE BIOGEL PI INDICATOR 6.5 (GLOVE) ×2
GLOVE BIOGEL PI INDICATOR 7.0 (GLOVE) ×2
GLOVE BIOGEL PI INDICATOR 8 (GLOVE) ×2
GOWN STRL REUS W/ TWL LRG LVL3 (GOWN DISPOSABLE) ×1 IMPLANT
GOWN STRL REUS W/ TWL XL LVL3 (GOWN DISPOSABLE) ×1 IMPLANT
GOWN STRL REUS W/TWL LRG LVL3 (GOWN DISPOSABLE) ×2
GOWN STRL REUS W/TWL XL LVL3 (GOWN DISPOSABLE) ×2
KIT BASIN OR (CUSTOM PROCEDURE TRAY) ×3 IMPLANT
KIT MARKER MARGIN INK (KITS) ×3 IMPLANT
LIGHT WAVEGUIDE WIDE FLAT (MISCELLANEOUS) IMPLANT
NEEDLE HYPO 25GX1X1/2 BEV (NEEDLE) ×3 IMPLANT
NS IRRIG 1000ML POUR BTL (IV SOLUTION) IMPLANT
PACK SURGICAL SETUP 50X90 (CUSTOM PROCEDURE TRAY) ×3 IMPLANT
PENCIL BUTTON HOLSTER BLD 10FT (ELECTRODE) ×3 IMPLANT
SPONGE LAP 18X18 X RAY DECT (DISPOSABLE) ×3 IMPLANT
SUT MNCRL AB 4-0 PS2 18 (SUTURE) ×3 IMPLANT
SUT SILK 2 0 SH (SUTURE) IMPLANT
SUT VIC AB 2-0 SH 27 (SUTURE) ×3
SUT VIC AB 2-0 SH 27XBRD (SUTURE) ×1 IMPLANT
SUT VIC AB 3-0 SH 27 (SUTURE) ×3
SUT VIC AB 3-0 SH 27X BRD (SUTURE) ×1 IMPLANT
SYR BULB 3OZ (MISCELLANEOUS) ×3 IMPLANT
SYR CONTROL 10ML LL (SYRINGE) ×3 IMPLANT
TOWEL OR 17X24 6PK STRL BLUE (TOWEL DISPOSABLE) ×3 IMPLANT
TOWEL OR 17X26 10 PK STRL BLUE (TOWEL DISPOSABLE) ×3 IMPLANT
TUBE CONNECTING 12'X1/4 (SUCTIONS)
TUBE CONNECTING 12X1/4 (SUCTIONS) IMPLANT
YANKAUER SUCT BULB TIP NO VENT (SUCTIONS) IMPLANT

## 2017-05-30 NOTE — Transfer of Care (Signed)
Immediate Anesthesia Transfer of Care Note  Patient: [name]  Procedure(s) Performed: LEFT BREAST LUMPECTOMY WITH RADIOACTIVE SEED LOCALIZATION (Left Breast)  Patient Location: PACU  Anesthesia Type:General  Level of Consciousness: awake, alert  and oriented  Airway & Oxygen Therapy: Patient Spontanous Breathing and Patient connected to nasal cannula oxygen  Post-op Assessment: Report given to RN and Post -op Vital signs reviewed and stable  Post vital signs: Reviewed and stable  Last Vitals:  Vitals:   05/30/17 0848 05/30/17 0920  BP: (!) 190/100 (!) 190/78  Pulse:    Resp:    Temp:    SpO2:      Last Pain:  Vitals:   05/30/17 0847  TempSrc: Oral      Patients Stated Pain Goal: 2 (94/[id number]/54 [id number])  Complications: No apparent anesthesia complications

## 2017-05-30 NOTE — Anesthesia Procedure Notes (Signed)
Procedure Name: LMA Insertion Date/Time: 05/30/2017 10:23 AM Performed by: Wilburn Cornelia, CRNA Pre-anesthesia Checklist: Emergency Drugs available, Patient identified, Suction available, Patient being monitored and Timeout performed Patient Re-evaluated:Patient Re-evaluated prior to induction Oxygen Delivery Method: Circle system utilized Preoxygenation: Pre-oxygenation with 100% oxygen Induction Type: IV induction Ventilation: Mask ventilation without difficulty LMA: LMA inserted LMA Size: 4.0 Placement Confirmation: positive ETCO2,  CO2 detector and breath sounds checked- equal and bilateral Tube secured with: Tape Dental Injury: Teeth and Oropharynx as per pre-operative assessment

## 2017-05-30 NOTE — Interval H&P Note (Signed)
History and Physical Interval Note:  05/30/2017 9:57 AM  [name]  has presented today for surgery, with the diagnosis of LEFT BREAST CANCER  The various methods of treatment have been discussed with the patient and family. After consideration of risks, benefits and other options for treatment, the patient has consented to  Procedure(s): LEFT BREAST LUMPECTOMY WITH RADIOACTIVE SEED LOCALIZATION (Left) as a surgical intervention .  The patient's history has been reviewed, patient examined, no change in status, stable for surgery.  I have reviewed the patient's chart and labs.  Questions were answered to the patient's satisfaction.     [place]

## 2017-05-30 NOTE — Anesthesia Preprocedure Evaluation (Signed)
Anesthesia Evaluation  Patient identified by MRN, date of birth, ID band Patient awake    Reviewed: Allergy & Precautions, H&P , NPO status , Patient's Chart, lab work & pertinent test results  Airway Mallampati: II   Neck ROM: full    Dental   Pulmonary neg pulmonary ROS,    breath sounds clear to auscultation       Cardiovascular hypertension,  Rhythm:regular Rate:Normal     Neuro/Psych PSYCHIATRIC DISORDERS Depression  Neuromuscular disease    GI/Hepatic GERD  ,  Endo/Other  diabetes, Type 2  Renal/GU      Musculoskeletal  (+) Arthritis ,   Abdominal   Peds  Hematology   Anesthesia Other Findings   Reproductive/Obstetrics Breast CA                             Anesthesia Physical Anesthesia Plan  ASA: II  Anesthesia Plan: General   Post-op Pain Management:    Induction: Intravenous  PONV Risk Score and Plan: 3 and Ondansetron and Treatment may vary due to age or medical condition  Airway Management Planned: LMA  Additional Equipment:   Intra-op Plan:   Post-operative Plan:   Informed Consent: I have reviewed the patients History and Physical, chart, labs and discussed the procedure including the risks, benefits and alternatives for the proposed anesthesia with the patient or authorized representative who has indicated his/her understanding and acceptance.     Plan Discussed with: CRNA, Anesthesiologist and Surgeon  Anesthesia Plan Comments:         Anesthesia Quick Evaluation

## 2017-05-30 NOTE — Op Note (Signed)
Preoperative diagnosis: Left breast DCIS high-grade upper outer quadrant  Postoperative diagnosis: Same  Procedure: Left breast seed localized partial mastectomy  Surgeon: Erroll Luna MD  Anesthesia: LMA with local  EBL: 20 cc  Specimen: Left breast tissue with seed and clip to pathology with additional lateral and medial margin sent separately  Drains: None  IV fluids: Per anesthesia record  Indications for procedure: The patient presents for left breast partial mastectomy for high-grade DCIS measuring 1.4 cm.  We discussed options of breast conservation versus mastectomy with reconstruction.  She opted for breast conservation.The procedure has been discussed with the patient. Alternatives to surgery have been discussed with the patient.  Risks of surgery include bleeding,  Infection,  Seroma formation, death,  and the need for further surgery.   The patient understands and wishes to proceed.  Description of procedure: The patient was met in the holding area.  Questions were answered.  The left breast was marked as the correct side.  She underwent seed placement as an outpatient.  She was taken back to the operating room.  She was placed upon the OR table.  After induction of LMA anesthesia, left breast was prepped and draped in sterile fashion and timeout was done.  X-rays were available for review in the operating room.  The neoprobe was used and hot spot was identified left breast upper outer quadrant just adjacent to the nipple areolar complex.  Local anesthetic was infiltrated.  Curvilinear incision was made along the border of the nipple areolar complex laterally.  All tissue around the seed and clip were excised.  Radiograph revealed both seed and clip to be in specimen.  The neoprobe was used to aid in dissection.  Gross margins were negative.  I took an additional medial lateral margin given the appearance on the radiograph.  The cavity was clipped.  It was closed with 3-0 Vicryl and  4-0 Monocryl after assuring hemostasis.  Dermabond applied.  All final counts found to be correct.  Breast binder placed.  The patient was awoke extubated taken to recovery in satisfactory condition.

## 2017-05-30 NOTE — Discharge Instructions (Signed)
[facility] °Office Phone Number [phone] ° °BREAST BIOPSY/ PARTIAL MASTECTOMY: POST OP INSTRUCTIONS ° °Always review your discharge instruction sheet given to you by the facility where your surgery was performed. ° °IF YOU HAVE DISABILITY OR FAMILY LEAVE FORMS, YOU MUST BRING THEM TO THE OFFICE FOR PROCESSING.  DO NOT GIVE THEM TO YOUR DOCTOR. ° °1. A prescription for pain medication may be given to you upon discharge.  Take your pain medication as prescribed, if needed.  If narcotic pain medicine is not needed, then you may take acetaminophen (Tylenol) or ibuprofen (Advil) as needed. °2. Take your usually prescribed medications unless otherwise directed °3. If you need a refill on your pain medication, please contact your pharmacy.  They will contact our office to request authorization.  Prescriptions will not be filled after 5pm or on week-ends. °4. You should eat very light the first 24 hours after surgery, such as soup, crackers, pudding, etc.  Resume your normal diet the day after surgery. °5. Most patients will experience some swelling and bruising in the breast.  Ice packs and a good support bra will help.  Swelling and bruising can take several days to resolve.  °6. It is common to experience some constipation if taking pain medication after surgery.  Increasing fluid intake and taking a stool softener will usually help or prevent this problem from occurring.  A mild laxative (Milk of Magnesia or Miralax) should be taken according to package directions if there are no bowel movements after 48 hours. °7. Unless discharge instructions indicate otherwise, you may remove your bandages 24-48 hours after surgery, and you may shower at that time.  You may have steri-strips (small skin tapes) in place directly over the incision.  These strips should be left on the skin for 7-10 days.  If your surgeon used skin glue on the incision, you may shower in 24 hours.  The glue will flake off over the  next 2-3 weeks.  Any sutures or staples will be removed at the office during your follow-up visit. °8. ACTIVITIES:  You may resume regular daily activities (gradually increasing) beginning the next day.  Wearing a good support bra or sports bra minimizes pain and swelling.  You may have sexual intercourse when it is comfortable. °a. You may drive when you no longer are taking prescription pain medication, you can comfortably wear a seatbelt, and you can safely maneuver your car and apply brakes. °b. RETURN TO WORK:  ______________________________________________________________________________________ °9. You should see your doctor in the office for a follow-up appointment approximately two weeks after your surgery.  Your doctor’s nurse will typically make your follow-up appointment when she calls you with your pathology report.  Expect your pathology report 2-3 business days after your surgery.  You may call to check if you do not hear from us after three days. °10. OTHER INSTRUCTIONS: _______________________________________________________________________________________________ _____________________________________________________________________________________________________________________________________ °_____________________________________________________________________________________________________________________________________ °_____________________________________________________________________________________________________________________________________ ° °WHEN TO CALL YOUR DOCTOR: °1. Fever over 101.0 °2. Nausea and/or vomiting. °3. Extreme swelling or bruising. °4. Continued bleeding from incision. °5. Increased pain, redness, or drainage from the incision. ° °The clinic staff is available to answer your questions during regular business hours.  Please don’t hesitate to call and ask to speak to one of the nurses for clinical concerns.  If you have a medical emergency, go to the nearest  emergency room or call 911.  A surgeon from [facility] is always on call at the hospital. ° °For further questions, please visit [email or web address]  °

## 2017-05-31 ENCOUNTER — Encounter (HOSPITAL_COMMUNITY): Payer: Self-pay | Admitting: Surgery

## 2017-05-31 NOTE — Anesthesia Postprocedure Evaluation (Signed)
Anesthesia Post Note  Patient: [place]  Procedure(s) Performed: LEFT BREAST LUMPECTOMY WITH RADIOACTIVE SEED LOCALIZATION (Left Breast)     Patient location during evaluation: PACU Anesthesia Type: General Level of consciousness: awake and alert Pain management: pain level controlled Vital Signs Assessment: post-procedure vital signs reviewed and stable Respiratory status: spontaneous breathing, nonlabored ventilation, respiratory function stable and patient connected to nasal cannula oxygen Cardiovascular status: blood pressure returned to baseline and stable Postop Assessment: no apparent nausea or vomiting Anesthetic complications: no    Last Vitals:  Vitals:   05/30/17 1215 05/30/17 1230  BP: (!) 159/74 (!) 156/69  Pulse: 68 69  Resp: 15 17  Temp:  (!) 36.3 C  SpO2: 94% 97%    Last Pain:  Vitals:   05/30/17 1301  TempSrc:   PainSc: 5                  Sun Wilensky S

## 2017-06-04 ENCOUNTER — Ambulatory Visit: Payer: Medicare HMO | Admitting: Family Medicine

## 2017-06-04 ENCOUNTER — Encounter: Payer: Self-pay | Admitting: Family Medicine

## 2017-06-04 VITALS — BP 201/99 | HR 70 | Wt 206.0 lb

## 2017-06-04 DIAGNOSIS — Z9889 Other specified postprocedural states: Secondary | ICD-10-CM

## 2017-06-04 DIAGNOSIS — R42 Dizziness and giddiness: Secondary | ICD-10-CM

## 2017-06-04 DIAGNOSIS — I10 Essential (primary) hypertension: Secondary | ICD-10-CM

## 2017-06-04 DIAGNOSIS — R51 Headache: Secondary | ICD-10-CM

## 2017-06-04 DIAGNOSIS — E782 Mixed hyperlipidemia: Secondary | ICD-10-CM

## 2017-06-04 DIAGNOSIS — R519 Headache, unspecified: Secondary | ICD-10-CM

## 2017-06-04 DIAGNOSIS — E119 Type 2 diabetes mellitus without complications: Secondary | ICD-10-CM | POA: Diagnosis not present

## 2017-06-04 MED ORDER — LOSARTAN POTASSIUM-HCTZ 50-12.5 MG PO TABS: 1.0000 | ORAL_TABLET | Freq: Every day | ORAL | 1 refills | Status: DC

## 2017-06-04 NOTE — Patient Instructions (Signed)
Start the new medication. Continue on your current medications.   Keep an eye on your BP.   If you were to develop chest pain, shortness of breath, or any neurological symptoms then call 911 or go to the closest emergency department.   Follow up with me in a couple of weeks as scheduled.   Call me Wednesday or Thursday with your BP readings.

## 2017-06-04 NOTE — Progress Notes (Signed)
   Subjective:    Patient ID: [name], female    DOB: [birth year], 72 y.o.   MRN: [id number]  HPI Chief Complaint  Patient presents with  . bp issues    bp running high 196/91, having headaches since surgery   She is here with complaints of her BP being elevated at home. She also reports intermittent headache and dizziness for the past 4 days since having a breast lumpectomy. She has been taking oxycodone for pain but stopped this yesterday. States dizziness has been with position changes only and she has been mainly laying down for the past 4 days. Denies headache or dizziness currently.  No fever, chills, blurred or double vision, tinnitus, chest pain, palpitations, shortness of breath, orthopnea, abdominal pain, N/V/D, urinary symptoms, LE edema.   States her BP was well controlled until recent diagnosis of breast cancer and lumpectomy. Since then, she reports elevated readings.  States she has been on a beta blocker for the past several years and states "I put myself on it because my husband was taking them". She has been on 25 mg metoprolol once daily until last week when her surgeon's office called regarding elevated BP and we increased this to twice daily. States her BP did respond initially but the past 4 days her BP has been 190s/90s.   She brought her BP machine from home to compare with ours. It does appear to be accurate.   Had an echo in 2009. Apparently she has been a patient of Dr. Stanford Breed in the past.   Reports blood sugars have improved. Yesterday her FBS was 131 and today 170s and states "it was high because I ate a lot of crap last night".   Reviewed allergies, medications, past medical, surgical, family, and social history.  Review of Systems Pertinent positives and negatives in the history of present illness.     Objective:   Physical Exam BP (!) 201/99 Comment: pt's cuff  Pulse 70   Wt 206 lb (93.4 kg)   BMI 34.28 kg/m   Alert and in no distress.  Tympanic membranes and canals are normal. Pharyngeal area is normal. Neck is supple without adenopathy or thyromegaly. Cardiac exam shows a regular rhythm with a RUSB murmur, no rubs or gallops. Lungs are clear to auscultation. extremities without edema, pulses normal. PERRLA, EOMs intact, CN II-IX intact, no facial asymmetry, no pronator drift, normal finger to nose, negative Romberg. DTRs normal and symmetric, skin warm and dry, no pallor.       Assessment & Plan:  Uncontrolled hypertension - Plan: EKG 12-Lead, losartan-hydrochlorothiazide (HYZAAR) 50-12.5 MG tablet  Status post breast lumpectomy  Intermittent headache  Controlled type 2 diabetes mellitus without complication, without long-term current use of insulin (HCC)  Mixed hyperlipidemia  Dizziness - Plan: EKG 12-Lead  ECG NSR, normal rate. No acute changes.  Plan to start her on Hyzaar and have her continue for now on metoprolol low dose.  Will have her change positions slowly. She has stopped the oxycodone.  She will continue checking her BP at home.  She will call me later this week with her BP readings.  Strict precautions to go to the ED or call 911 if she has chest pain, shortness of breath or any neurological symptoms.  Plan to follow up in 2 weeks for recheck of HTN and diabetes.

## 2017-06-05 NOTE — Assessment & Plan Note (Signed)
05/30/17: Left Lumpectomy: DCIS Intermediate Grade with necrosis and calcs, 0.9 cm, Er: 95%, PR 95%, TisNx (Stage 0)  Pathology counseling: I discussed the final pathology report of the patient provided  a copy of this report.  We also discussed the final staging  Plan: 1. Adj RT 2. Adj Tamoxifen 20 mg daily X 5 years  RTC after XRT

## 2017-06-06 ENCOUNTER — Inpatient Hospital Stay: Payer: Medicare HMO | Attending: Hematology and Oncology | Admitting: Hematology and Oncology

## 2017-06-06 DIAGNOSIS — C50412 Malignant neoplasm of upper-outer quadrant of left female breast: Secondary | ICD-10-CM

## 2017-06-06 DIAGNOSIS — Z170 Estrogen receptor positive status [ER+]: Secondary | ICD-10-CM | POA: Diagnosis not present

## 2017-06-06 DIAGNOSIS — D0512 Intraductal carcinoma in situ of left breast: Secondary | ICD-10-CM | POA: Diagnosis not present

## 2017-06-06 NOTE — Progress Notes (Signed)
Patient Care Team: Girtha Rm, NP-C as PCP - General (Family Medicine)  DIAGNOSIS:  Encounter Diagnosis  Name Primary?  . Carcinoma of upper-outer quadrant of left breast in female, estrogen receptor positive ([place])     SUMMARY OF ONCOLOGIC HISTORY:   Ductal carcinoma in situ (DCIS) of left breast   04/24/2017 Initial Diagnosis    Screening detected left breast calcifications in 3 areas UOQ, anterior group of calcifications 1.4 cm biopsy revealed high-grade DCIS with calcifications ER 95%, PR 95%, other 2 areas benign fibrocystic changes, Tis N0 stage 0      05/30/2017 Surgery    Left Lumpectomy: DCIS Intermediate Grade with necrosis and calcs, 0.9 cm, Er: 95%, PR 95%, TisNx (Stage 0)       CHIEF COMPLIANT: Follow-up after left lumpectomy  INTERVAL HISTORY: [name] is a 72 year old with above-mentioned his left breast DCIS who underwent lumpectomy and is here today to discuss the pathology report.  She is healing very well from recent surgery.  She denies any major pain or discomfort.  REVIEW OF SYSTEMS:   Constitutional: Denies fevers, chills or abnormal weight loss Eyes: Denies blurriness of vision Ears, nose, mouth, throat, and face: Denies mucositis or sore throat Respiratory: Denies cough, dyspnea or wheezes Cardiovascular: Denies palpitation, chest discomfort Gastrointestinal:  Denies nausea, heartburn or change in bowel habits Skin: Denies abnormal skin rashes Lymphatics: Denies new lymphadenopathy or easy bruising Neurological:Denies numbness, tingling or new weaknesses Behavioral/Psych: Mood is stable, no new changes  Extremities: No lower extremity edema Breast: Recent left lumpectomy All other systems were reviewed with the patient and are negative.  I have reviewed the past medical history, past surgical history, social history and family history with the patient and they are unchanged from previous note.  ALLERGIES:  is allergic to atorvastatin;  lisinopril; and tetracycline.  MEDICATIONS:  Current Outpatient Medications  Medication Sig Dispense Refill  . ACCU-CHEK AVIVA PLUS test strip TEST TWICE A DAY. NEEDS AN ACCU-CHEK METER AS WELL. DX CODE- E11.9 100 each 5  . alendronate (FOSAMAX) 70 MG tablet Take 1 tablet (70 mg total) by mouth every 7 (seven) days. Take with a full glass of water on an empty stomach. (Patient taking differently: Take 70 mg by mouth every 7 (seven) days. Take with a full glass of water on an empty stomach. Sundays) 4 tablet 11  . aspirin 325 MG EC tablet Take 325 mg by mouth daily.    . Blood Glucose Monitoring Suppl (ACCU-CHEK AVIVA PLUS) w/Device KIT USE AS DIRECTED 2 TIMES DAILY 1 kit 0  . glipiZIDE (GLUCOTROL) 5 MG tablet TAKE 1 TABLET BY MOUTH EVERY DAY BEFORE BREAKFAST 30 tablet 5  . ibuprofen (ADVIL,MOTRIN) 800 MG tablet Take 1 tablet (800 mg total) by mouth every 8 (eight) hours as needed. 30 tablet 0  . losartan-hydrochlorothiazide (HYZAAR) 50-12.5 MG tablet Take 1 tablet by mouth daily. 30 tablet 1  . metFORMIN (GLUCOPHAGE) 1000 MG tablet TAKE 1 TABLET BY MOUTH TWICE A DAY WITH A MEAL 180 tablet 1  . metoprolol tartrate (LOPRESSOR) 25 MG tablet Take 1 tablet (25 mg total) by mouth 2 (two) times daily. 60 tablet 1  . OVER THE COUNTER MEDICATION Calcium 1200 with Vitamin D 1000- 1 tab daily    . OVER THE COUNTER MEDICATION Saffron Extract 88.50 mg - 1 daily    . oxyCODONE (OXY IR/ROXICODONE) 5 MG immediate release tablet Take 1 tablet (5 mg total) by mouth every 6 (six) hours as  needed for severe pain. (Patient not taking: Reported on 06/04/2017) 12 tablet 0  . pravastatin (PRAVACHOL) 20 MG tablet TAKE 1 TABLET BY MOUTH EVERY DAY 30 tablet 5  . vitamin C (ASCORBIC ACID) 500 MG tablet Take 500 mg by mouth daily.    . vitamin E 1000 UNIT capsule Take 1,000 Units by mouth daily.     No current facility-administered medications for this visit.     PHYSICAL EXAMINATION: ECOG PERFORMANCE STATUS: 1 -  Symptomatic but completely ambulatory  Vitals:   06/06/17 1439  BP: (!) 195/83  Pulse: 72  Resp: 17  Temp: 98.2 F (36.8 C)  SpO2: 99%   Filed Weights   06/06/17 1439  Weight: 208 lb (94.3 kg)    GENERAL:alert, no distress and comfortable SKIN: skin color, texture, turgor are normal, no rashes or significant lesions EYES: normal, Conjunctiva are pink and non-injected, sclera clear OROPHARYNX:no exudate, no erythema and lips, buccal mucosa, and tongue normal  NECK: supple, thyroid normal size, non-tender, without nodularity LYMPH:  no palpable lymphadenopathy in the cervical, axillary or inguinal LUNGS: clear to auscultation and percussion with normal breathing effort HEART: regular rate & rhythm and no murmurs and no lower extremity edema ABDOMEN:abdomen soft, non-tender and normal bowel sounds MUSCULOSKELETAL:no cyanosis of digits and no clubbing  NEURO: alert & oriented x 3 with fluent speech, no focal motor/sensory deficits EXTREMITIES: No lower extremity edema  LABORATORY DATA:  I have reviewed the data as listed CMP Latest Ref Rng & Units 05/24/2017 05/02/2017 10/09/2016  Glucose 65 - 99 mg/dL 154(H) 158(H) 194(H)  BUN 6 - 20 mg/dL '10 13 11  ' Creatinine 0.44 - 1.00 mg/dL 0.65 0.75 0.57  Sodium 135 - 145 mmol/L 140 140 135  Potassium 3.5 - 5.1 mmol/L 4.1 4.3 4.5  Chloride 101 - 111 mmol/L 107 105 99  CO2 22 - 32 mmol/L 20(L) 26 23  Calcium 8.9 - 10.3 mg/dL 9.1 9.3 9.1  Total Protein 6.4 - 8.3 g/dL - 6.9 6.6  Total Bilirubin 0.2 - 1.2 mg/dL - 0.5 0.6  Alkaline Phos 40 - 150 U/L - 62 73  AST 5 - 34 U/L - 23 17  ALT 0 - 55 U/L - 25 22    Lab Results  Component Value Date   WBC 11.9 (H) 05/24/2017   HGB 13.0 05/24/2017   HCT 39.6 05/24/2017   MCV 82.3 05/24/2017   PLT 343 05/24/2017   NEUTROABS 7.4 (H) 05/02/2017    ASSESSMENT & PLAN:  Carcinoma of upper-outer quadrant of left breast in female, estrogen receptor positive ([place]) 05/30/17: Left Lumpectomy: DCIS  Intermediate Grade with necrosis and calcs, 0.9 cm, Er: 95%, PR 95%, TisNx (Stage 0)  Pathology counseling: I discussed the final pathology report of the patient provided  a copy of this report.  We also discussed the final staging  Plan: 1. Adj RT 2. Adj Tamoxifen 20 mg daily X 5 years  RTC after XRT    I spent 25 minutes talking to the patient of which more than half was spent in counseling and coordination of care.  No orders of the defined types were placed in this encounter.  The patient has a good understanding of the overall plan. she agrees with it. she will call with any problems that may develop before the next visit here.   Harriette Ohara, MD 06/06/17

## 2017-06-11 ENCOUNTER — Inpatient Hospital Stay: Payer: Medicare HMO

## 2017-06-11 ENCOUNTER — Inpatient Hospital Stay: Payer: Medicare HMO | Attending: Hematology and Oncology | Admitting: Genetics

## 2017-06-11 DIAGNOSIS — Z8041 Family history of malignant neoplasm of ovary: Secondary | ICD-10-CM | POA: Diagnosis not present

## 2017-06-11 DIAGNOSIS — Z170 Estrogen receptor positive status [ER+]: Secondary | ICD-10-CM

## 2017-06-11 DIAGNOSIS — D0512 Intraductal carcinoma in situ of left breast: Secondary | ICD-10-CM | POA: Diagnosis not present

## 2017-06-11 DIAGNOSIS — Z8042 Family history of malignant neoplasm of prostate: Secondary | ICD-10-CM

## 2017-06-11 DIAGNOSIS — C50412 Malignant neoplasm of upper-outer quadrant of left female breast: Secondary | ICD-10-CM

## 2017-06-11 DIAGNOSIS — Z800 Family history of malignant neoplasm of digestive organs: Secondary | ICD-10-CM | POA: Diagnosis not present

## 2017-06-12 ENCOUNTER — Encounter: Payer: Self-pay | Admitting: Genetics

## 2017-06-12 ENCOUNTER — Encounter: Payer: Self-pay | Admitting: Family Medicine

## 2017-06-12 DIAGNOSIS — Z8042 Family history of malignant neoplasm of prostate: Secondary | ICD-10-CM | POA: Insufficient documentation

## 2017-06-12 DIAGNOSIS — Z8041 Family history of malignant neoplasm of ovary: Secondary | ICD-10-CM | POA: Insufficient documentation

## 2017-06-12 DIAGNOSIS — Z800 Family history of malignant neoplasm of digestive organs: Secondary | ICD-10-CM | POA: Insufficient documentation

## 2017-06-12 NOTE — Progress Notes (Signed)
REFERRING PROVIDER: Nicholas Lose, MD [place], [place] [address]  PRIMARY PROVIDER:  Girtha Rm, NP-C  PRIMARY REASON FOR VISIT:  1. Ductal carcinoma in situ (DCIS) of left breast   2. Family history of ovarian cancer   3. Family history of colon cancer   4. Family history of pancreatic cancer   5. Family history of prostate cancer   6. Family history of stomach cancer   7. Carcinoma of upper-outer quadrant of left breast in female, estrogen receptor positive ([place])      HISTORY OF PRESENT ILLNESS:   [name], a 72 y.o. female, was seen for a [facility] cancer genetics consultation at the request of Dr. Lindi Adie due to a personal and family history of cancer.  Ms. Gritz presents to clinic today to discuss the possibility of a hereditary predisposition to cancer, genetic testing, and to further clarify her future cancer risks, as well as potential cancer risks for family members.   On 04/24/2017, at the age of 57, Ms. Lauricella was diagnosed with Ductal carcinoma in situ of the left breast ER+, PR+. She had a left lumpectomy on 05/30/2017. Adjuvant RT and tamoxifen are planned.    CANCER HISTORY:    Ductal carcinoma in situ (DCIS) of left breast   04/24/2017 Initial Diagnosis    Screening detected left breast calcifications in 3 areas UOQ, anterior group of calcifications 1.4 cm biopsy revealed high-grade DCIS with calcifications ER 95%, PR 95%, other 2 areas benign fibrocystic changes, Tis N0 stage 0      05/30/2017 Surgery    Left Lumpectomy: DCIS Intermediate Grade with necrosis and calcs, 0.9 cm, Er: 95%, PR 95%, TisNx (Stage 0)       HORMONAL RISK FACTORS:  Menarche was at age 67.  First live birth at age 47.  OCP use for approximately 16 years.  Ovaries intact: yes.  Hysterectomy: no.  Menopausal status: postmenopausal.  HRT use: 6 months years. Colonoscopy: yes; 2005, repotedly normal, reports she is due for another one. Mammogram  within the last year: yes.  Past Medical History:  Diagnosis Date  . Abnormal bone density screening   . Arthritis   . Complication of anesthesia    "i wake up"  . Diverticulosis   . DM (diabetes mellitus) ([place])   . Family history of colon cancer   . Family history of ovarian cancer   . Family history of pancreatic cancer   . Family history of prostate cancer   . Family history of stomach cancer   . GERD (gastroesophageal reflux disease)   . HTN (hypertension)   . Hyperlipidemia   . Poor compliance   . Vitamin D deficiency     Past Surgical History:  Procedure Laterality Date  . APPENDECTOMY    . BREAST LUMPECTOMY WITH RADIOACTIVE SEED LOCALIZATION Left 05/30/2017   Procedure: LEFT BREAST LUMPECTOMY WITH RADIOACTIVE SEED LOCALIZATION;  Surgeon: Erroll Luna, MD;  Location: [facility];  Service: General;  Laterality: Left;  . DILATION AND CURETTAGE OF UTERUS    . FOREIGN BODY REMOVAL ESOPHAGEAL    . TONSILLECTOMY    . TUBAL LIGATION      Social History   Socioeconomic History  . Marital status: Married    Spouse name: None  . Number of children: None  . Years of education: None  . Highest education level: None  Social Needs  . Financial resource strain: None  . Food insecurity - worry: None  . Food insecurity -  inability: None  . Transportation needs - medical: None  . Transportation needs - non-medical: None  Occupational History  . None  Tobacco Use  . Smoking status: Never Smoker  . Smokeless tobacco: Never Used  Substance and Sexual Activity  . Alcohol use: No  . Drug use: No  . Sexual activity: None  Other Topics Concern  . None  Social History Narrative  . None     FAMILY HISTORY:  We obtained a detailed, 4-generation family history.  Significant diagnoses are listed below: Family History  Problem Relation Age of Onset  . Colon cancer Mother 61  . Ovarian cancer Mother 74       'radium treatment'  . Prostate cancer Maternal Uncle 30        metastatic 'died from it'  . Stomach cancer Maternal Grandmother 57  . Heart disease Maternal Grandmother   . Pancreatic cancer Maternal Aunt 66  . Prostate cancer Maternal Uncle 29       metastatic/ 'died from it'  . Cervical cancer Daughter    Ms. Donofrio has a daughter who is 52 and has a history of cervical cancer.  Ms. Kneip also has a 67 year-old son with no history of cancer.  Ms. Zuckerman has several grandchildren and 1 great-granddaughter who is 18 months. Ms. Noxon has a brother who is 76 with no history of cancer and no children.   Ms. Ranta's father died in his 21's due to alcohol induced disease.  She does not know very much information about this side of the family.  Her paternal grandmother died in hier 3's with no known history of cancer.   Ms. Ranes's mother was diagnosed with ovarian cancer in her 9's.   She says she received radium treatment.  Based on the treatment, I wonder if this was ovarian cancer or a different gyn cancer.  Ms. Kriegel mother had a thyroid tumor removed in her 8's as well and had colon cancer diagnosed in her 10's.  She had polyps every 2-3 years identified on colonoscopy.   Ms. Eppard has 2 maternal aunts and 2 maternal uncles listed below: -1 maternal aunt is 21 with no history of cancer.  She has children with no history of cancer.  -1 maternal aunt died of pancreatic cancer in her 78's.   -1 maternal uncle died in his 67's due to metastatic prostate cancer.   -1 maternal uncle died at 60 due to metastatic prostate cancer.   No maternal cousins with any known diagnosis of cancer.  Ms. Hapke maternal grandfather died in his 18's with no history of cancer.  Ms. Salvetti's maternal grandmother died at 65 with stomach cancer.  She also had heart disease.   Ms. Derocher is Vallarie Mare of previous family history of genetic testing for hereditary cancer risks. Patient's maternal ancestors are of [place] descent, and paternal ancestors are of  Dutch/English descent. There is no reported Ashkenazi Jewish ancestry. There is no known consanguinity.  GENETIC COUNSELING ASSESSMENT: Savera Donson is a 72 y.o. female with a personal and family history which is somewhat suggestive of a Hereditary Cancer Predisposition Syndrome. We, therefore, discussed and recommended the following at today's visit.   DISCUSSION: We reviewed the characteristics, features and inheritance patterns of hereditary cancer syndromes. We also discussed genetic testing, including the appropriate family members to test, the process of testing, insurance coverage and turn-around-time for results. We discussed the implications of a negative, positive and/or variant of uncertain significant result. We  recommended Ms. Mendez pursue genetic testing for the Common Hereditary Cancers gene panel. The Common Hereditary Cancer Panel offered by [facility] includes sequencing and/or deletion duplication testing of the following 47 genes: APC, ATM, AXIN2, BARD1, BMPR1A, BRCA1, BRCA2, BRIP1, CDH1, CDKN2A (p14ARF), CDKN2A (p16INK4a), CKD4, CHEK2, CTNNA1, DICER1, EPCAM (Deletion/duplication testing only), GREM1 (promoter region deletion/duplication testing only), KIT, MEN1, MLH1, MSH2, MSH3, MSH6, MUTYH, NBN, NF1, NHTL1, PALB2, PDGFRA, PMS2, POLD1, POLE, PTEN, RAD50, RAD51C, RAD51D, SDHB, SDHC, SDHD, SMAD4, SMARCA4. STK11, TP53, TSC1, TSC2, and VHL.  The following genes were evaluated for sequence changes only: SDHA and HOXB13 c.251G>A variant only.  We discussed that only 5-10% of cancers are associated with a Hereditary cancer predisposition syndrome.  One of the most common hereditary cancer syndromes that increases breast cancer risk is called Hereditary Breast and Ovarian Cancer (HBOC) syndrome.  This syndrome is caused by mutations in the BRCA1 and BRCA2 genes.  This syndrome increases an individual's lifetime risk to develop breast, ovarian, pancreatic, and other types of cancer.  There are  also many other cancer predisposition syndromes caused by mutations in several other genes.  We discussed that if she is found to have a mutation in one of these genes, it may impact future medical management recommendations such as increased cancer screenings and consideration of risk reducing surgeries.  A positive result could also have implications for the patient's family members.  A Negative result would mean we were unable to identify a hereditary component to her cancer, but does not rule out the possibility of a hereditary basis for her cancer.  There could be mutations that are undetectable by current technology, or in genes not yet tested or identified to increase cancer risk.    We discussed the potential to find a Variant of Uncertain Significance or VUS.  These are variants that have not yet been identified as pathogenic or benign, and it is unknown if this variant is associated with increased cancer risk or if this is a normal finding.  Most VUS's are reclassified to benign or likely benign.   It should not be used to make medical management decisions. With time, we suspect the lab will determine the significance of any VUS's identified if any.   Based on Ms. Morikawa's personal and family history of cancer, she meets medical criteria for genetic testing. Despite that she meets criteria, she may still have an out of pocket cost. We discussed that if her out of pocket cost for testing is over $100, the laboratory will call and confirm whether she wants to proceed with testing.  If the out of pocket cost of testing is less than $100 she will be billed by the genetic testing laboratory.  We discussed that if insurance does not cover testing, medicare patients are typically not billed.   PLAN: After considering the risks, benefits, and limitations, Ms. Littman  provided informed consent to pursue genetic testing.  Her blood sample will be taken on 3/12 at 10:15.  The blood sample will be sent to  [facility] for analysis of the Common Hereditary Cancers Panel. Results should then be available within approximately 2-3 weeks' time, at which point they will be disclosed by telephone to Ms. Rauber, as will any additional recommendations warranted by these results. Ms. Counts will receive a summary of her genetic counseling visit and a copy of her results once available. This information will also be available in Epic. We encouraged Ms. Redwine to remain in contact with cancer genetics annually  so that we can continuously update the family history and inform her of any changes in cancer genetics and testing that may be of benefit for her family. Ms. Menard questions were answered to her satisfaction today. Our contact information was provided should additional questions or concerns arise.  Based on Ms. Pohlmann's family history, we recommended her siblings and maternal relatives, have genetic counseling and testing. Ms. Brunell will let us know if we can be of any assistance in coordinating genetic counseling and/or testing for this family member.   Lastly, we encouraged Ms. Bixby to remain in contact with cancer genetics annually so that we can continuously update the family history and inform her of any changes in cancer genetics and testing that may be of benefit for this family.   Ms.  Kuznicki questions were answered to her satisfaction today. Our contact information was provided should additional questions or concerns arise. Thank you for the referral and allowing [place] to share in the care of your patient.   Tana Felts, MS, [facility] Certified Genetic Counselor [email or web address] phone: [phone]  The patient was seen for a total of 40 minutes in face-to-face genetic counseling.  This patient was discussed with Drs. Magrinat, Lindi Adie and/or Burr Medico who agrees with the above.

## 2017-06-13 NOTE — Progress Notes (Signed)
Location of Breast Cancer: Left Breast  Histology per Pathology Report:  04/24/17 Diagnosis 1. Breast, left, needle core biopsy, upper outer anterior - DUCTAL CARCINOMA IN SITU, HIGH GRADE - SEE COMMENT 2. Breast, left, needle core biopsy, upper inner - FIBROADENOMATOID NODULE WITH CALCIFICATIONS - NO MALIGNANCY IDENTIFIED  Receptor Status: ER(95%), PR (95%)  05/30/17 Diagnosis 1. Breast, lumpectomy, Left - DUCTAL CARCINOMA IN SITU, INTERMEDIATE GRADE, WITH NECROSIS AND CALCIFICATIONS - USUAL DUCTAL HYPERPLASIA AND ADENOSIS WITH CALCIFICATIONS - BENIGN FIBROADENOMATOID NODULES WITH CALCIFICATIONS - SMALL SCLEROTIC PAPILLOMA - PREVIOUS BIOPSY SITE CHANGES - SEE COMMENT 2. Breast, excision, Left additional lateral margin - USUAL DUCTAL HYPERPLASIA, FIBROCYSTIC CHANGES AND INTRADUCTAL PAPILLOMAS - NO RESIDUAL CARCINOMA IDENTIFIED 3. Breast, excision, Left additional medial margin - THERMAL ARTIFACT INVOLVING DUCTAL EPITHELIUM - SEE COMMENT  Did patient present with symptoms or was this found on screening mammography?: It was found on a screening mammogram.   Past/Anticipated interventions by surgeon, if any: 05/30/17 Procedure: Left breast seed localized partial mastectomy Surgeon: Erroll Luna MD  -- She saw Dr. Brantley Stage on 06/18/17, and he feels like she is recovering well.    Past/Anticipated interventions by medical oncology, if any:  06/06/27 Dr. Lindi Adie Plan: 1. Adj RT 2. Adj Tamoxifen 20 mg daily X 5 years  RTC after XRT   Lymphedema issues, if any:  She denies, she has good arm mobility  Pain issues, if any:  She denies.   SAFETY ISSUES:  Prior radiation? No  Pacemaker/ICD? No  Possible current pregnancy? No  Is the patient on methotrexate? No  Current Complaints / other details:    BP (!) 184/82   Pulse 70   Temp 98.2 F (36.8 C)   Ht 5\' 5"  (1.651 m)   Wt 198 lb 9.6 oz (90.1 kg)   SpO2 97% Comment: room air  BMI 33.05 kg/m     Mohd. Derflinger,  Stephani Police, RN 06/13/2017,2:57 PM

## 2017-06-15 ENCOUNTER — Other Ambulatory Visit: Payer: Self-pay | Admitting: Family Medicine

## 2017-06-15 ENCOUNTER — Encounter: Payer: Self-pay | Admitting: Family Medicine

## 2017-06-15 ENCOUNTER — Ambulatory Visit: Payer: Medicare HMO | Admitting: Family Medicine

## 2017-06-15 VITALS — BP 160/90 | HR 76 | Wt 205.4 lb

## 2017-06-15 DIAGNOSIS — E559 Vitamin D deficiency, unspecified: Secondary | ICD-10-CM

## 2017-06-15 DIAGNOSIS — I10 Essential (primary) hypertension: Secondary | ICD-10-CM

## 2017-06-15 DIAGNOSIS — E1165 Type 2 diabetes mellitus with hyperglycemia: Secondary | ICD-10-CM | POA: Diagnosis not present

## 2017-06-15 DIAGNOSIS — Z8249 Family history of ischemic heart disease and other diseases of the circulatory system: Secondary | ICD-10-CM | POA: Diagnosis not present

## 2017-06-15 DIAGNOSIS — M810 Age-related osteoporosis without current pathological fracture: Secondary | ICD-10-CM | POA: Diagnosis not present

## 2017-06-15 DIAGNOSIS — IMO0001 Reserved for inherently not codable concepts without codable children: Secondary | ICD-10-CM

## 2017-06-15 HISTORY — DX: Age-related osteoporosis without current pathological fracture: M81.0

## 2017-06-15 MED ORDER — DAPAGLIFLOZIN PROPANEDIOL 5 MG PO TABS: 5.0000 mg | ORAL_TABLET | Freq: Every day | ORAL | 0 refills | Status: DC

## 2017-06-15 NOTE — Progress Notes (Signed)
Subjective:    Patient ID: [name], female    DOB: [birth year], 72 y.o.   MRN: [id number]  HPI Chief Complaint  Patient presents with  . 4 week follow-up    BS- lowest-120- highest 180, follow-up bp   She is here to follow-up on hypertension, diabetes and osteoporosis.  Her blood pressures in the past have been controlled on a beta-blocker alone and apparently she started this medication on her own since her husband was on this for his HTN  States since her recent diagnosis of breast cancer,  her blood pressure has been uncontrolled.  At her last appointment 2 weeks ago we did add Hyzaar to her beta-blocker.  Blood pressure cuff was brought in at her last visit and is accurate compared to our office cuff.  She reports her blood pressures were improving at home since adding the Hyzaar but the past few days her BP has been increasing again.  Denies chest pain, palpitations, DOE, orthopnea, LE edema.   Echo in 2009 while at the ED for chest pain. This was done by Dr. Stanford Breed. She denies ever seeing a cardiologist but would like to see one now. Reports family history of heart disease. States her mother had cardiomyopathy and MGM with unknown heart disease.   Type 2 diabetes has been uncontrolled for "years" and up to 10.3% in summer 2018. She admits that she did not take care of herself but since breast cancer diagnose she is becoming more concerned with her health.  We managed to get her A1c previously getting closer to goal range at 7.3% in October 2018, however approximately 6 weeks ago her A1c jumped up to 8.5%.  She did not want to add or increase medication at that time and wanted to try and control this with lifestyle modifications.  Her fasting blood sugars at home have been between 128-180.  States she has been eating a lot of pasta and carbohydrates but knows she shouldn't. Has not been exercising.   Denies polyuria, polydipsia, blurred vision.    Mother, uncle and cousin with  thyroid cancer. Unknown specific type. Denies history of UTIs.   She was diagnosed recently with osteoporosis and chose to start on oral alendronate. States she is having side effects such as difficulty swallowing, heart burn, "bone pain" so she stopped this medication. She would like to try something different.   She is taking vitamin D 1,000 IU daily.   Denies fever, chills, dizziness, chest pain, abdominal pain, N/V/D, urinary symptoms.   Reviewed allergies, medications, past medical, surgical, family, and social history.   Review of Systems Pertinent positives and negatives in the history of present illness.     Objective:   Physical Exam BP (!) 160/90   Pulse 76   Wt 205 lb 6.4 oz (93.2 kg)   BMI 34.18 kg/m   Alert and oriented and in no acute distress. Pharyngeal area is normal. Neck is supple without adenopathy or thyromegaly. Cardiac exam shows a regular rhythm with a RUSB murmur, no rubs or gallops. Lungs are clear to auscultation. extremities without edema, pulses normal.       Assessment & Plan:  Uncontrolled type 2 diabetes mellitus with hyperglycemia ([place]) - Plan: Ambulatory referral to Endocrinology  Age-related osteoporosis without current pathological fracture  Vitamin D deficiency  Uncontrolled hypertension - Plan: Ambulatory referral to Cardiology  Family history of ischemic heart disease (IHD) - Plan: Ambulatory referral to Cardiology  Her blood sugars are not well controlled.  She has been reluctant to add medication to her regimen but today she is willing.  She will continue on metformin 1000 mg twice daily, glipizide each morning and I will add [place].  She was given a 2-week sample of [place] and a 30-day voucher for free prescription.  Family history of thyroid cancer so we did not add a GLP-1.  She does not want to start on insulin.    She is willing to see an endocrinologist so I am referring her. She has seen a nutritionist in the past and states  this did not help.  She admits that her diet is still high in carbohydrates she is not exercising. We are having difficulty getting her blood pressure under control.  In the past she was taking a beta-blocker that she started because her husband was on the same.  Apparently this kept her blood pressure under good control for years but since having breast cancer her blood pressures have been increasing.  I kept her on the beta-blocker but increase the dose to twice daily and added Hyzaar.  We have discussed the DASH diet and lifestyle modifications to control her blood pressure.  Referral to cardiology for further evaluation of uncontrolled hypertension, family history of heart disease.  She does have a murmur and history of echo in 2009.  Osteoporosis with T score -3.1.  We put her on alendronate and she has had side effects with this medication and has failed the trial of oral bisphosphonate.  She is interested in trying Prolia.  I will initiate this.  Discussed continuing on vitamin D supplement, getting adequate calcium in her diet and encouraged her to start weightbearing exercises.

## 2017-06-15 NOTE — Telephone Encounter (Signed)
Pt coming in today

## 2017-06-19 ENCOUNTER — Encounter: Payer: Self-pay | Admitting: Radiation Oncology

## 2017-06-19 ENCOUNTER — Ambulatory Visit
Admission: RE | Admit: 2017-06-19 | Discharge: 2017-06-19 | Disposition: A | Discharge status: Home / Self Care | Payer: Medicare HMO | Source: Ambulatory Visit | Attending: Radiation Oncology | Admitting: Radiation Oncology

## 2017-06-19 ENCOUNTER — Other Ambulatory Visit: Payer: Self-pay

## 2017-06-19 ENCOUNTER — Inpatient Hospital Stay: Payer: Medicare HMO

## 2017-06-19 VITALS — BP 184/82 | HR 70 | Temp 98.2°F | Ht 65.0 in | Wt 198.6 lb

## 2017-06-19 DIAGNOSIS — Z170 Estrogen receptor positive status [ER+]: Secondary | ICD-10-CM | POA: Diagnosis not present

## 2017-06-19 DIAGNOSIS — Y842 Radiological procedure and radiotherapy as the cause of abnormal reaction of the patient, or of later complication, without mention of misadventure at the time of the procedure: Secondary | ICD-10-CM | POA: Diagnosis not present

## 2017-06-19 DIAGNOSIS — Z7982 Long term (current) use of aspirin: Secondary | ICD-10-CM | POA: Diagnosis not present

## 2017-06-19 DIAGNOSIS — Z8041 Family history of malignant neoplasm of ovary: Secondary | ICD-10-CM | POA: Diagnosis not present

## 2017-06-19 DIAGNOSIS — R5383 Other fatigue: Secondary | ICD-10-CM | POA: Insufficient documentation

## 2017-06-19 DIAGNOSIS — C50412 Malignant neoplasm of upper-outer quadrant of left female breast: Secondary | ICD-10-CM

## 2017-06-19 DIAGNOSIS — Z79899 Other long term (current) drug therapy: Secondary | ICD-10-CM | POA: Insufficient documentation

## 2017-06-19 DIAGNOSIS — D0512 Intraductal carcinoma in situ of left breast: Secondary | ICD-10-CM | POA: Diagnosis not present

## 2017-06-19 DIAGNOSIS — Z881 Allergy status to other antibiotic agents status: Secondary | ICD-10-CM | POA: Insufficient documentation

## 2017-06-19 DIAGNOSIS — Z800 Family history of malignant neoplasm of digestive organs: Secondary | ICD-10-CM | POA: Diagnosis not present

## 2017-06-19 DIAGNOSIS — Z888 Allergy status to other drugs, medicaments and biological substances status: Secondary | ICD-10-CM | POA: Insufficient documentation

## 2017-06-19 DIAGNOSIS — Z9889 Other specified postprocedural states: Secondary | ICD-10-CM | POA: Diagnosis not present

## 2017-06-19 DIAGNOSIS — Z8042 Family history of malignant neoplasm of prostate: Secondary | ICD-10-CM | POA: Diagnosis not present

## 2017-06-19 NOTE — Progress Notes (Signed)
Radiation Oncology         (336) [phone] ________________________________  Name: [name] MRN: [id number]  Date: 06/19/2017  DOB: 05/03/1945  Follow-Up Visit Note  Outpatient  CC: Girtha Rm, NP-C  Erroll Luna, MD  Diagnosis:      ICD-10-CM   1. Carcinoma of upper-outer quadrant of left breast in female, estrogen receptor positive ([place]) C50.412 MM Digital Diagnostic Unilat L   Z17.0    Stage 0 Tis N0M0 Left Breast UOQ Ductal carcinoma in situ, ER+ / PR+, Intermediate grade  CHIEF COMPLAINT: Here to discuss management of her left breast DCIS  Narrative:  The patient returns today for follow-up.     Since consultation, she underwent left breast lumpectomy by Dr. Brantley Stage on 05/30/17. Pathology returned DCIS intermediate grade. No residual carcinoma was identified. The carcinoma spanned 0.9 cm in greatest dimension. Margins were clear by at least 0.2 cm. There was thermal artifact involving ductal epithelium at the inked, lateral resection margin. The findings are concerning for in-situ carcinoma; however, there is no residual carcinoma identified in the additional lateral margin.  The patient met with Dr. Lindi Adie on 06/05/17 who recommends adjuvant RT and Tamoxifen x 5 yrs. She has kindly been referred back to radiation oncology to discuss the role of radiotherapy in the treatment of her breast DCIS.  On review of systems, the patient denies smoking. She denies mobility issues with left arm.  Results from genetic counseling are pending.           ALLERGIES:  is allergic to atorvastatin; fosamax [alendronate sodium]; lisinopril; and tetracycline.  Meds: Current Outpatient Medications  Medication Sig Dispense Refill  . ACCU-CHEK AVIVA PLUS test strip TEST TWICE A DAY. NEEDS AN ACCU-CHEK METER AS WELL. DX CODE- E11.9 100 each 5  . aspirin 325 MG EC tablet Take 325 mg by mouth daily.    . Blood Glucose Monitoring Suppl (ACCU-CHEK AVIVA PLUS) w/Device KIT USE AS DIRECTED 2  TIMES DAILY 1 kit 0  . dapagliflozin propanediol (FARXIGA) 5 MG TABS tablet Take 5 mg by mouth daily. 30 tablet 0  . glipiZIDE (GLUCOTROL) 5 MG tablet TAKE 1 TABLET BY MOUTH EVERY DAY BEFORE BREAKFAST 30 tablet 5  . ibuprofen (ADVIL,MOTRIN) 800 MG tablet Take 1 tablet (800 mg total) by mouth every 8 (eight) hours as needed. 30 tablet 0  . losartan-hydrochlorothiazide (HYZAAR) 50-12.5 MG tablet Take 1 tablet by mouth daily. 30 tablet 1  . metFORMIN (GLUCOPHAGE) 1000 MG tablet TAKE 1 TABLET BY MOUTH TWICE A DAY WITH A MEAL 180 tablet 0  . metoprolol tartrate (LOPRESSOR) 25 MG tablet Take 1 tablet (25 mg total) by mouth 2 (two) times daily. 60 tablet 1  . OVER THE COUNTER MEDICATION Calcium 1200 with Vitamin D 1000- 1 tab daily    . OVER THE COUNTER MEDICATION Saffron Extract 88.50 mg - 1 daily    . pravastatin (PRAVACHOL) 20 MG tablet TAKE 1 TABLET BY MOUTH EVERY DAY 30 tablet 5  . vitamin C (ASCORBIC ACID) 500 MG tablet Take 500 mg by mouth daily.    . vitamin E 1000 UNIT capsule Take 1,000 Units by mouth daily.     No current facility-administered medications for this encounter.     Physical Findings:  height is _0  (1.651 m) and weight is 198 lb 9.6 oz (90.1 kg). Her temperature is 98.2 F (36.8 C). Her blood pressure is 184/82 (abnormal) and her pulse is 70. Her oxygen saturation is 97%. Marland Kitchen  General: Alert and oriented, in no acute distress Extremities: No lymphedema in arms Musculoskeletal:  Good range of motion in her shoulders. Neurologic: No obvious focalities. Speech is fluent.  Psychiatric: Judgment and insight are intact. Affect is appropriate. Breast exam reveals left breast lumpectomy scar healing well. She still has a moderate seroma under the scar.  Lab Findings: Lab Results  Component Value Date   WBC 11.9 (H) 05/24/2017   HGB 13.0 05/24/2017   HCT 39.6 05/24/2017   MCV 82.3 05/24/2017   PLT 343 05/24/2017      Radiographic Findings: Mm Breast Surgical  Specimen  Result Date: 05/30/2017 CLINICAL DATA:  Evaluate surgical specimen following LEFT breast lumpectomy for DCIS. EXAM: SPECIMEN RADIOGRAPH OF THE LEFT BREAST COMPARISON:  Previous exam(s). FINDINGS: Status post excision of the left breast. The radioactive seed and coil shaped biopsy marker clip are present, completely intact, and were marked for pathology. IMPRESSION: Specimen radiograph of the left breast. Electronically Signed   By: Margarette Canada M.D.   On: 05/30/2017 10:59   Mm Lt Radioactive Seed Loc Mammo Guide  Result Date: 05/28/2017 CLINICAL DATA:  72 year old female presenting for radioactive seed localization of the left breast prior to lumpectomy for DCIS. EXAM: MAMMOGRAPHIC GUIDED RADIOACTIVE SEED LOCALIZATION OF THE LEFT BREAST COMPARISON:  Previous exam(s). FINDINGS: Patient presents for radioactive seed localization prior to left breast lumpectomy for DCIS. I met with the patient and we discussed the procedure of seed localization including benefits and alternatives. We discussed the high likelihood of a successful procedure. We discussed the risks of the procedure including infection, bleeding, tissue injury and further surgery. We discussed the low dose of radioactivity involved in the procedure. Informed, written consent was given. The usual time-out protocol was performed immediately prior to the procedure. Using mammographic guidance, sterile technique, 1% lidocaine and an I-125 radioactive seed, the coil shaped biopsy marking clip in the upper-outer quadrant of the left breast was localized using a superior approach. The follow-up mammogram images confirm the seed in the expected location and were marked for Dr. Brantley Stage. Follow-up survey of the patient confirms presence of the radioactive seed. Order number of I-125 seed:  [id number]. Total activity:  7.026 millicuries reference Date: 05/25/2017 The patient tolerated the procedure well and was released from the [facility]. She was  given instructions regarding seed removal. IMPRESSION: Radioactive seed localization left breast. No apparent complications. Electronically Signed   By: Ammie Ferrier M.D.   On: 05/28/2017 16:21    Impression/Plan: Left Breast DCIS  We discussed adjuvant radiotherapy today.  I recommend 4 weeks of radiation therapy to the patient's left breast in order to reduce the risk of local recurrence by half.  The risks, benefits and side effects of this treatment were discussed in detail.  She understands that radiotherapy is associated with skin irritation and fatigue in the acute setting. Late effects can include cosmetic changes and rare injury to internal organs.   She is enthusiastic about proceeding with treatment. A consent form has been signed and placed in her chart.  A total of 3 medically necessary complex treatment devices will be fabricated and supervised by me: 2 fields with MLCs for custom blocks to protect heart, and lungs;  and, a Vac-lok. MORE COMPLEX DEVICES MAY BE MADE IN DOSIMETRY FOR FIELD IN FIELD BEAMS FOR DOSE HOMOGENEITY.  I have requested : 3D Simulation which is medically necessary to give adequate dose to at risk tissues while sparing lungs and heart.  I have  requested a DVH of the following structures: lungs, heart, lumpectomy cavity.    The patient will receive 40.05 Gy in 15 fractions to the left breast with 2 fields.  This will be followed by a boost.  I will order a post-op baseline mammogram to ensure no suspicious residual calcifications before she starts radiotherapy.   I spent 25 minutes face to face with the patient and more than 50% of that time was spent in counseling and/or coordination of care.    _____________________________________   Eppie Gibson, MD  This document serves as a record of services personally performed by Eppie Gibson, MD. It was created on his behalf by Linward Natal, a trained medical scribe. The creation of this record is based on the  scribe's personal observations and the provider's statements to them. This document has been checked and approved by the attending provider.

## 2017-06-21 ENCOUNTER — Telehealth: Payer: Self-pay | Admitting: *Deleted

## 2017-06-21 NOTE — Telephone Encounter (Signed)
CALLED PATIENT TO INFORM OF MAMMOGRAM ON 07-02-17 - ARRIVAL TIME - 12:50 PM @ [facility], SPOKE WITH PATIENT AND SHE IS AWARE OF THIS TEST

## 2017-06-28 ENCOUNTER — Ambulatory Visit: Payer: Medicare HMO | Admitting: Cardiovascular Disease

## 2017-06-28 ENCOUNTER — Encounter: Payer: Self-pay | Admitting: Cardiovascular Disease

## 2017-06-28 DIAGNOSIS — I10 Essential (primary) hypertension: Secondary | ICD-10-CM

## 2017-06-28 DIAGNOSIS — E7800 Pure hypercholesterolemia, unspecified: Secondary | ICD-10-CM | POA: Diagnosis not present

## 2017-06-28 NOTE — Progress Notes (Signed)
06/28/2017 [name]   11/05/1945  [id number]  Primary Physician Girtha Rm, NP-C Primary Cardiologist: Lorretta Harp MD [facility], [place], [place]  HPI:  [name] is a 72 y.o. moderately overweight married Caucasian female mother of 2, grandmother and 3 grandchildren . 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 She was referred by Fayrene Fearing NP  . She has a history of treated hypertension and hyperlipidemia. She has never smoked. There is no family history. She's never had a heart attack or stroke. She denies chest pain or shortness of breath. She has had this cancer and had surgical excision of this by Dr. Brantley Stage recently and is scheduled to undergo radiation therapy.   Current Meds  Medication Sig  . ACCU-CHEK AVIVA PLUS test strip TEST TWICE A DAY. NEEDS AN ACCU-CHEK METER AS WELL. DX CODE- E11.9  . aspirin 325 MG EC tablet Take 325 mg by mouth daily.  . Blood Glucose Monitoring Suppl (ACCU-CHEK AVIVA PLUS) w/Device KIT USE AS DIRECTED 2 TIMES DAILY  . dapagliflozin propanediol (FARXIGA) 5 MG TABS tablet Take 5 mg by mouth daily.  Marland Kitchen glipiZIDE (GLUCOTROL) 5 MG tablet TAKE 1 TABLET BY MOUTH EVERY DAY BEFORE BREAKFAST  . losartan-hydrochlorothiazide (HYZAAR) 50-12.5 MG tablet Take 1 tablet by mouth daily.  . metFORMIN (GLUCOPHAGE) 1000 MG tablet TAKE 1 TABLET BY MOUTH TWICE A DAY WITH A MEAL  . metoprolol tartrate (LOPRESSOR) 25 MG tablet Take 1 tablet (25 mg total) by mouth 2 (two) times daily.  Marland Kitchen OVER THE COUNTER MEDICATION Calcium 1200 with Vitamin D 1000- 1 tab daily  . OVER THE COUNTER MEDICATION Saffron Extract 88.50 mg - 1 daily  . pravastatin (PRAVACHOL) 20 MG tablet TAKE 1 TABLET BY MOUTH EVERY DAY  . vitamin C (ASCORBIC ACID) 500 MG tablet Take 500 mg by mouth daily.     Allergies  Allergen Reactions  . Atorvastatin Other (See Comments)    dizziness  . Fosamax [Alendronate Sodium]     Cough,trouble shallowing, chest  pain  . Lisinopril Cough  . Tetracycline Nausea And Vomiting    Social History   Socioeconomic History  . Marital status: Married    Spouse name: Not on file  . Number of children: Not on file  . Years of education: Not on file  . Highest education level: Not on file  Occupational History  . Not on file  Social Needs  . Financial resource strain: Not on file  . Food insecurity:    Worry: Not on file    Inability: Not on file  . Transportation needs:    Medical: Not on file    Non-medical: Not on file  Tobacco Use  . Smoking status: Never Smoker  . Smokeless tobacco: Never Used  Substance and Sexual Activity  . Alcohol use: No  . Drug use: No  . Sexual activity: Not on file  Lifestyle  . Physical activity:    Days per week: Not on file    Minutes per session: Not on file  . Stress: Not on file  Relationships  . Social connections:    Talks on phone: Not on file    Gets together: Not on file    Attends religious service: Not on file    Active member of club or organization: Not on file    Attends meetings of clubs or organizations: Not on file    Relationship status: Not on file  .  Intimate partner violence:    Fear of current or ex partner: Not on file    Emotionally abused: Not on file    Physically abused: Not on file    Forced sexual activity: Not on file  Other Topics Concern  . Not on file  Social History Narrative  . Not on file     Review of Systems: General: negative for chills, fever, night sweats or weight changes.  Cardiovascular: negative for chest pain, dyspnea on exertion, edema, orthopnea, palpitations, paroxysmal nocturnal dyspnea or shortness of breath Dermatological: negative for rash Respiratory: negative for cough or wheezing Urologic: negative for hematuria Abdominal: negative for nausea, vomiting, diarrhea, bright red blood per rectum, melena, or hematemesis Neurologic: negative for visual changes, syncope, or dizziness All other  systems reviewed and are otherwise negative except as noted above.    Blood pressure (!) 161/89, pulse 78, height '5\' 5"'  (1.651 m), weight 202 lb (91.6 kg).  General appearance: alert and no distress Neck: no adenopathy, no carotid bruit, no JVD, supple, symmetrical, trachea midline and thyroid not enlarged, symmetric, no tenderness/mass/nodules Lungs: clear to auscultation bilaterally Heart: regular rate and rhythm, S1, S2 normal, no murmur, click, rub or gallop Extremities: extremities normal, atraumatic, no cyanosis or edema Pulses: 2+ and symmetric Skin: Skin color, texture, turgor normal. No rashes or lesions Neurologic: Alert and oriented X 3, normal strength and tone. Normal symmetric reflexes. Normal coordination and gait  EKG not performed today  ASSESSMENT AND PLAN:   Essential hypertension History of essential hypertension blood pressure measured at 158/84. She is on losartan, hydrochlorothiazide and metoprolol. Continue current meds at current dosing.  Hyperlipidemia History of hyperlipidemia on statin therapy with recent lipid profile performed 02/05/17 revealed total cholesterol 160 with an HDL 42.      Lorretta Harp MD FACP,FACC,FAHA, [facility] 06/28/2017 2:43 PM

## 2017-06-28 NOTE — Assessment & Plan Note (Signed)
History of essential hypertension blood pressure measured at 158/84. She is on losartan, hydrochlorothiazide and metoprolol. Continue current meds at current dosing.

## 2017-06-28 NOTE — Patient Instructions (Signed)
Your physician recommends that you schedule a follow-up appointment as needed with Dr. Berry   

## 2017-06-28 NOTE — Assessment & Plan Note (Signed)
History of hyperlipidemia on statin therapy with recent lipid profile performed 02/05/17 revealed total cholesterol 160 with an HDL 42.

## 2017-07-02 ENCOUNTER — Ambulatory Visit
Admission: RE | Admit: 2017-07-02 | Discharge: 2017-07-02 | Disposition: A | Discharge status: Home / Self Care | Payer: Medicare HMO | Source: Ambulatory Visit | Attending: Radiation Oncology | Admitting: Radiation Oncology

## 2017-07-02 ENCOUNTER — Telehealth: Payer: Self-pay | Admitting: Family Medicine

## 2017-07-02 DIAGNOSIS — C50412 Malignant neoplasm of upper-outer quadrant of left female breast: Secondary | ICD-10-CM

## 2017-07-02 DIAGNOSIS — R922 Inconclusive mammogram: Secondary | ICD-10-CM | POA: Diagnosis not present

## 2017-07-02 DIAGNOSIS — Z170 Estrogen receptor positive status [ER+]: Principal | ICD-10-CM

## 2017-07-02 NOTE — Telephone Encounter (Signed)
Left message for pt to call concerning prolia. Insurance verifications done for both medical and pharmacy benefits. Medical benefits are $ 215.00 out of pocket. To received at pharmacy will still be estimated 215 plus insurance will be billed for 60 for injection fee. Left message for pt to call me back.

## 2017-07-03 ENCOUNTER — Ambulatory Visit
Admission: RE | Admit: 2017-07-03 | Discharge: 2017-07-03 | Disposition: A | Discharge status: Home / Self Care | Payer: Medicare HMO | Source: Ambulatory Visit | Attending: Radiation Oncology | Admitting: Radiation Oncology

## 2017-07-03 DIAGNOSIS — C50412 Malignant neoplasm of upper-outer quadrant of left female breast: Secondary | ICD-10-CM | POA: Insufficient documentation

## 2017-07-03 DIAGNOSIS — Z170 Estrogen receptor positive status [ER+]: Secondary | ICD-10-CM | POA: Insufficient documentation

## 2017-07-03 DIAGNOSIS — Z510 Encounter for antineoplastic radiation therapy: Secondary | ICD-10-CM | POA: Diagnosis not present

## 2017-07-03 DIAGNOSIS — D0512 Intraductal carcinoma in situ of left breast: Secondary | ICD-10-CM | POA: Diagnosis not present

## 2017-07-03 NOTE — Progress Notes (Signed)
Radiation Oncology         (336) [id number] ________________________________  Name: [name] MRN: [id number]  Date: 07/03/2017  DOB: 1945-11-21  SIMULATION AND TREATMENT PLANNING NOTE  /  Special treatment procedure  Outpatient  DIAGNOSIS:     ICD-10-CM   1. Carcinoma of upper-outer quadrant of left breast in female, estrogen receptor positive ([place]) C50.412    Z17.0     NARRATIVE:  The patient was brought to the [facility].  Identity was confirmed.  All relevant records and images related to the planned course of therapy were reviewed.  The patient freely provided informed written consent to proceed with treatment after reviewing the details related to the planned course of therapy. The consent form was witnessed and verified by the simulation staff.    Then, the patient was set-up in a stable reproducible supine position for radiation therapy with her ipsilateral arm over her head, and her upper body secured in a custom-made Vac-lok device.  CT images were obtained.  Surface markings were placed.  The CT images were loaded into the planning software.    Special treatment procedure:  Special treatment procedure was performed today due to the extra time and effort required by myself to plan and prepare this patient for deep inspiration breath hold technique.  I have determined cardiac sparing to be of benefit to this patient to prevent long term cardiac damage due to radiation of the heart.  Bellows were placed on the patient's abdomen. To facilitate cardiac sparing, the patient was coached by the radiation therapists on breath hold techniques and breathing practice was performed. Practice waveforms were obtained. The patient was then scanned while maintaining breath hold in the treatment position.  This image was then transferred over to the imaging specialist. The imaging specialist then created a fusion of the free breathing and breath hold scans using the chest wall as  the stable structure. I personally reviewed the fusion in axial, coronal and sagittal image planes.  Excellent cardiac sparing was obtained.  I felt the patient is an appropriate candidate for breath hold and the patient will be treated as such.  The image fusion was then reviewed with the patient to reinforce the necessity of reproducible breath hold.   TREATMENT PLANNING NOTE: Treatment planning then occurred.  The radiation prescription was entered and confirmed.     A total of 3 medically necessary complex treatment devices were fabricated and supervised by me: 2 fields with MLCs for custom blocks to protect heart, and lungs;  and, a Vac-lok. MORE COMPLEX DEVICES MAY BE MADE IN DOSIMETRY FOR FIELD IN FIELD BEAMS FOR DOSE HOMOGENEITY.  I have requested : 3D Simulation which is medically necessary to give adequate dose to at risk tissues while sparing lungs and heart.  I have requested a DVH of the following structures: lungs, heart, left lumpectomy cavity.    The patient will receive 40.05 Gy in 15 fractions to the left breast with 2 tangential fields.   This will be followed by a boost.  Optical Surface Tracking Plan:  Since intensity modulated radiotherapy (IMRT) and 3D conformal radiation treatment methods are predicated on accurate and precise positioning for treatment, intrafraction motion monitoring is medically necessary to ensure accurate and safe treatment delivery. The ability to quantify intrafraction motion without excessive ionizing radiation dose can only be performed with optical surface tracking. Accordingly, surface imaging offers the opportunity to obtain 3D measurements of patient position throughout IMRT and 3D treatments  without excessive radiation exposure. I am ordering optical surface tracking for this patient's upcoming course of radiotherapy.  ________________________________   Reference:  Ursula Alert, J, et al. Surface imaging-based analysis of  intrafraction motion for breast radiotherapy patients.Journal of [place], n. 6, nov. 2014. ISSN [id number].  Available at: <http://www.jacmp.org/index.php/jacmp/article/view/4957>.    -----------------------------------  Eppie Gibson, MD

## 2017-07-04 ENCOUNTER — Other Ambulatory Visit (INDEPENDENT_AMBULATORY_CARE_PROVIDER_SITE_OTHER): Payer: Medicare HMO

## 2017-07-04 ENCOUNTER — Other Ambulatory Visit: Payer: Self-pay | Admitting: Family Medicine

## 2017-07-04 ENCOUNTER — Other Ambulatory Visit: Payer: Medicare HMO

## 2017-07-04 DIAGNOSIS — Z510 Encounter for antineoplastic radiation therapy: Secondary | ICD-10-CM | POA: Diagnosis not present

## 2017-07-04 DIAGNOSIS — M810 Age-related osteoporosis without current pathological fracture: Secondary | ICD-10-CM | POA: Diagnosis not present

## 2017-07-04 DIAGNOSIS — D0512 Intraductal carcinoma in situ of left breast: Secondary | ICD-10-CM | POA: Diagnosis not present

## 2017-07-04 DIAGNOSIS — I10 Essential (primary) hypertension: Secondary | ICD-10-CM

## 2017-07-04 DIAGNOSIS — Z170 Estrogen receptor positive status [ER+]: Secondary | ICD-10-CM | POA: Diagnosis not present

## 2017-07-04 DIAGNOSIS — C50412 Malignant neoplasm of upper-outer quadrant of left female breast: Secondary | ICD-10-CM | POA: Diagnosis not present

## 2017-07-04 MED ORDER — DENOSUMAB 60 MG/ML ~~LOC~~ SOLN
60.0000 mg | Freq: Once | SUBCUTANEOUS | Status: AC
  Administered 2017-07-04: 60 mg via SUBCUTANEOUS

## 2017-07-04 MED ORDER — DENOSUMAB 60 MG/ML ~~LOC~~ SOLN: 60.0000 mg | Freq: Once | SUBCUTANEOUS | 0 refills | Status: AC

## 2017-07-04 MED FILL — PROLIA 60 MG/ML SOLN: 60 | 180 days supply | Qty: 1 | Fill #0

## 2017-07-09 ENCOUNTER — Ambulatory Visit
Admission: RE | Admit: 2017-07-09 | Discharge: 2017-07-09 | Disposition: A | Discharge status: Home / Self Care | Payer: Medicare HMO | Source: Ambulatory Visit | Attending: Radiation Oncology | Admitting: Radiation Oncology

## 2017-07-09 DIAGNOSIS — Z510 Encounter for antineoplastic radiation therapy: Secondary | ICD-10-CM | POA: Insufficient documentation

## 2017-07-09 DIAGNOSIS — D0512 Intraductal carcinoma in situ of left breast: Secondary | ICD-10-CM | POA: Insufficient documentation

## 2017-07-09 DIAGNOSIS — C801 Malignant (primary) neoplasm, unspecified: Secondary | ICD-10-CM

## 2017-07-09 HISTORY — DX: Malignant (primary) neoplasm, unspecified: C80.1

## 2017-07-10 ENCOUNTER — Encounter: Payer: Self-pay | Admitting: Endocrinology

## 2017-07-10 ENCOUNTER — Ambulatory Visit: Payer: Medicare HMO | Admitting: Endocrinology

## 2017-07-10 ENCOUNTER — Ambulatory Visit
Admission: RE | Admit: 2017-07-10 | Discharge: 2017-07-10 | Disposition: A | Discharge status: Home / Self Care | Payer: Medicare HMO | Source: Ambulatory Visit | Attending: Radiation Oncology | Admitting: Radiation Oncology

## 2017-07-10 VITALS — BP 190/90 | HR 86 | Wt 204.4 lb

## 2017-07-10 DIAGNOSIS — D0512 Intraductal carcinoma in situ of left breast: Secondary | ICD-10-CM | POA: Diagnosis not present

## 2017-07-10 DIAGNOSIS — E1165 Type 2 diabetes mellitus with hyperglycemia: Secondary | ICD-10-CM | POA: Diagnosis not present

## 2017-07-10 DIAGNOSIS — Z510 Encounter for antineoplastic radiation therapy: Secondary | ICD-10-CM | POA: Diagnosis not present

## 2017-07-10 DIAGNOSIS — E119 Type 2 diabetes mellitus without complications: Secondary | ICD-10-CM | POA: Diagnosis not present

## 2017-07-10 DIAGNOSIS — IMO0001 Reserved for inherently not codable concepts without codable children: Secondary | ICD-10-CM

## 2017-07-10 MED ORDER — GLIPIZIDE 5 MG PO TABS: 2.5000 mg | ORAL_TABLET | Freq: Every day | ORAL | 5 refills | Status: DC

## 2017-07-10 MED ORDER — SITAGLIPTIN PHOSPHATE 100 MG PO TABS: 100.0000 mg | ORAL_TABLET | Freq: Every day | ORAL | 11 refills | Status: DC

## 2017-07-10 NOTE — Progress Notes (Signed)
Subjective:    Patient ID: [name], female    DOB: [birth year], 72 y.o.   MRN: [id number]  HPI pt is referred by [name], NP, for diabetes.  Pt states DM was dx'ed in 2005 (she had GDM in 1985); she has mild if any neuropathy of the lower extremities; she is unaware of any associated chronic complications; she has never been on insulin; pt says her diet and exercise are improved recently; she has never had pancreatitis, pancreatic surgery, severe hypoglycemia, or DKA.  She takes 3 oral meds.  She says cbg's are in the 100's.   Past Medical History:  Diagnosis Date  . Abnormal bone density screening   . Arthritis   . Complication of anesthesia    "i wake up"  . Diverticulosis   . DM (diabetes mellitus) ([place])   . Family history of colon cancer   . Family history of ovarian cancer   . Family history of pancreatic cancer   . Family history of prostate cancer   . Family history of stomach cancer   . GERD (gastroesophageal reflux disease)   . HTN (hypertension)   . Hyperlipidemia   . Osteoporosis 06/15/2017  . Poor compliance   . Vitamin D deficiency     Past Surgical History:  Procedure Laterality Date  . APPENDECTOMY    . BREAST BIOPSY Left 04/24/2017   malignant  . BREAST BIOPSY Left 04/24/2017   benign  . BREAST BIOPSY Left 04/25/2017   benign  . BREAST LUMPECTOMY Left 05/30/2017  . BREAST LUMPECTOMY WITH RADIOACTIVE SEED LOCALIZATION Left 05/30/2017   Procedure: LEFT BREAST LUMPECTOMY WITH RADIOACTIVE SEED LOCALIZATION;  Surgeon: Erroll Luna, MD;  Location: [facility];  Service: General;  Laterality: Left;  . DILATION AND CURETTAGE OF UTERUS    . FOREIGN BODY REMOVAL ESOPHAGEAL    . TONSILLECTOMY    . TUBAL LIGATION      Social History   Socioeconomic History  . Marital status: Married    Spouse name: Not on file  . Number of children: Not on file  . Years of education: Not on file  . Highest education level: Not on file  Occupational History  . Not on  file  Social Needs  . Financial resource strain: Not on file  . Food insecurity:    Worry: Not on file    Inability: Not on file  . Transportation needs:    Medical: Not on file    Non-medical: Not on file  Tobacco Use  . Smoking status: Never Smoker  . Smokeless tobacco: Never Used  Substance and Sexual Activity  . Alcohol use: No  . Drug use: No  . Sexual activity: Not on file  Lifestyle  . Physical activity:    Days per week: Not on file    Minutes per session: Not on file  . Stress: Not on file  Relationships  . Social connections:    Talks on phone: Not on file    Gets together: Not on file    Attends religious service: Not on file    Active member of club or organization: Not on file    Attends meetings of clubs or organizations: Not on file    Relationship status: Not on file  . Intimate partner violence:    Fear of current or ex partner: Not on file    Emotionally abused: Not on file    Physically abused: Not on file    Forced sexual activity: Not  on file  Other Topics Concern  . Not on file  Social History Narrative  . Not on file    Current Outpatient Medications on File Prior to Visit  Medication Sig Dispense Refill  . ACCU-CHEK AVIVA PLUS test strip TEST TWICE A DAY. NEEDS AN ACCU-CHEK METER AS WELL. DX CODE- E11.9 100 each 5  . aspirin 325 MG EC tablet Take 325 mg by mouth daily.    . Blood Glucose Monitoring Suppl (ACCU-CHEK AVIVA PLUS) w/Device KIT USE AS DIRECTED 2 TIMES DAILY 1 kit 0  . dapagliflozin propanediol (FARXIGA) 5 MG TABS tablet Take 5 mg by mouth daily. 30 tablet 0  . losartan-hydrochlorothiazide (HYZAAR) 50-12.5 MG tablet Take 1 tablet by mouth daily. 30 tablet 1  . metFORMIN (GLUCOPHAGE) 1000 MG tablet TAKE 1 TABLET BY MOUTH TWICE A DAY WITH A MEAL 180 tablet 0  . metoprolol tartrate (LOPRESSOR) 25 MG tablet Take 1 tablet (25 mg total) by mouth 2 (two) times daily. 60 tablet 1  . OVER THE COUNTER MEDICATION Calcium 1200 with Vitamin D  1000- 1 tab daily    . OVER THE COUNTER MEDICATION Saffron Extract 88.50 mg - 1 daily    . pravastatin (PRAVACHOL) 20 MG tablet TAKE 1 TABLET BY MOUTH EVERY DAY 30 tablet 5  . vitamin C (ASCORBIC ACID) 500 MG tablet Take 500 mg by mouth daily.     No current facility-administered medications on file prior to visit.     Allergies  Allergen Reactions  . Atorvastatin Other (See Comments)    dizziness  . Fosamax [Alendronate Sodium]     Cough,trouble shallowing, chest pain  . Lisinopril Cough  . Tetracycline Nausea And Vomiting    Family History  Problem Relation Age of Onset  . Colon cancer Mother 51  . Ovarian cancer Mother 65       'radium treatment'  . Cardiomyopathy Mother   . Prostate cancer Maternal Uncle 67       metastatic 'died from it'  . Stomach cancer Maternal Grandmother 41  . Heart disease Maternal Grandmother   . Diabetes Maternal Grandfather   . Pancreatic cancer Maternal Aunt 66  . Prostate cancer Maternal Uncle 67       metastatic/ 'died from it'  . Cervical cancer Daughter     BP (!) 190/90 (BP Location: Left Arm, Patient Position: Sitting, Cuff Size: Large)   Pulse 86   Wt 204 lb 6.4 oz (92.7 kg)   BMI 34.01 kg/m    Review of Systems denies blurry vision, headache, chest pain, sob, n/v, urinary frequency, hypoglycemia, muscle cramps, dry skin, memory loss, depression, cold intolerance, rhinorrhea, and easy bruising.  She has anxiety.  She has lost 70 lbs x 3 years.       Objective:   Physical Exam VS: see vs page GEN: no distress HEAD: head: no deformity eyes: no periorbital swelling, no proptosis external nose and ears are normal mouth: no lesion seen NECK: supple, thyroid is not enlarged CHEST WALL: no deformity LUNGS: clear to auscultation CV: reg rate and rhythm, no murmur ABD: abdomen is soft, nontender.  no hepatosplenomegaly.  not distended.  no hernia MUSCULOSKELETAL: muscle bulk and strength are grossly normal.  no obvious joint  swelling.  gait is normal and steady EXTEMITIES: no deformity.  no ulcer on the feet.  feet are of normal color and temp.  Trace bilat leg edema.  There is bilateral onychomycosis of the toenails PULSES: dorsalis pedis intact bilat.  no carotid bruit NEURO:  cn 2-12 grossly intact.   readily moves all 4's.  sensation is intact to touch on the feet.   SKIN:  Normal texture and temperature.  No rash or suspicious lesion is visible.   NODES:  None palpable at the neck.   PSYCH: alert, well-oriented.  Does not appear anxious nor depressed.     A1c=8.0%  Lab Results  Component Value Date   CREATININE 0.65 05/24/2017   BUN 10 05/24/2017   NA 140 05/24/2017   K 4.1 05/24/2017   CL 107 05/24/2017   CO2 20 (L) 05/24/2017   Lab Results  Component Value Date   MICROALBUR 3.6 02/11/2016   I have reviewed outside records, and summarized: Pt was noted to have elevated a1c, and referred here.  She was also seen by cardiol, and other risk factors were well-controlled.       Assessment & Plan:  Type 2 DM, new to me: she needs increased rx HTN: is noted today.  Edema: this limits rx options for DM and HTN.    Patient Instructions  Your blood pressure is high today.  Please see your primary care provider soon, to have it rechecked good diet and exercise significantly improve the control of your diabetes.  please let me know if you wish to be referred to a dietician.  high blood sugar is very risky to your health.  you should see an eye doctor and dentist every year.  It is very important to get all recommended vaccinations.  Controlling your blood pressure and cholesterol drastically reduces the damage diabetes does to your body.  Those who smoke should quit.  Please discuss these with your doctor.  check your blood sugar 4 times a day.  vary the time of day when you check, between before the 3 meals, and at bedtime.  also check if you have symptoms of your blood sugar being too high or too low.   please keep a record of the readings and bring it to your next appointment here (or you can bring the meter itself).  You can write it on any piece of paper.  please call us sooner if your blood sugar goes below 70, or if you have a lot of readings over 200.   I have sent 2 prescriptions to your pharmacy; to add "januvia," and to half the glipizide Please continue the same other diabetes medications. Please come back for a follow-up appointment in 2 months.

## 2017-07-10 NOTE — Patient Instructions (Addendum)
Your blood pressure is high today.  Please see your primary care provider soon, to have it rechecked good diet and exercise significantly improve the control of your diabetes.  please let me know if you wish to be referred to a dietician.  high blood sugar is very risky to your health.  you should see an eye doctor and dentist every year.  It is very important to get all recommended vaccinations.  Controlling your blood pressure and cholesterol drastically reduces the damage diabetes does to your body.  Those who smoke should quit.  Please discuss these with your doctor.  check your blood sugar 4 times a day.  vary the time of day when you check, between before the 3 meals, and at bedtime.  also check if you have symptoms of your blood sugar being too high or too low.  please keep a record of the readings and bring it to your next appointment here (or you can bring the meter itself).  You can write it on any piece of paper.  please call us sooner if your blood sugar goes below 70, or if you have a lot of readings over 200.   I have sent 2 prescriptions to your pharmacy; to add "januvia," and to half the glipizide Please continue the same other diabetes medications. Please come back for a follow-up appointment in 2 months.

## 2017-07-11 ENCOUNTER — Ambulatory Visit
Admission: RE | Admit: 2017-07-11 | Discharge: 2017-07-11 | Disposition: A | Discharge status: Home / Self Care | Payer: Medicare HMO | Source: Ambulatory Visit | Attending: Radiation Oncology | Admitting: Radiation Oncology

## 2017-07-11 DIAGNOSIS — Z510 Encounter for antineoplastic radiation therapy: Secondary | ICD-10-CM | POA: Diagnosis not present

## 2017-07-11 DIAGNOSIS — D0512 Intraductal carcinoma in situ of left breast: Secondary | ICD-10-CM | POA: Diagnosis not present

## 2017-07-12 ENCOUNTER — Ambulatory Visit
Admission: RE | Admit: 2017-07-12 | Discharge: 2017-07-12 | Disposition: A | Discharge status: Home / Self Care | Payer: Medicare HMO | Source: Ambulatory Visit | Attending: Radiation Oncology | Admitting: Radiation Oncology

## 2017-07-12 DIAGNOSIS — Z510 Encounter for antineoplastic radiation therapy: Secondary | ICD-10-CM | POA: Diagnosis not present

## 2017-07-12 DIAGNOSIS — D0512 Intraductal carcinoma in situ of left breast: Secondary | ICD-10-CM | POA: Diagnosis not present

## 2017-07-12 MED ORDER — RADIAPLEXRX EX GEL
Freq: Once | CUTANEOUS | Status: AC
  Administered 2017-07-12: 14:00:00 via TOPICAL

## 2017-07-12 MED ORDER — ALRA NON-METALLIC DEODORANT (RAD-ONC)
1.0000 "application " | Freq: Once | TOPICAL | Status: AC
  Administered 2017-07-12: 1 via TOPICAL

## 2017-07-12 NOTE — Progress Notes (Signed)

## 2017-07-13 ENCOUNTER — Telehealth: Payer: Self-pay | Admitting: Genetics

## 2017-07-13 ENCOUNTER — Ambulatory Visit
Admission: RE | Admit: 2017-07-13 | Discharge: 2017-07-13 | Disposition: A | Discharge status: Home / Self Care | Payer: Medicare HMO | Source: Ambulatory Visit | Attending: Radiation Oncology | Admitting: Radiation Oncology

## 2017-07-13 ENCOUNTER — Encounter: Payer: Self-pay | Admitting: Genetics

## 2017-07-13 ENCOUNTER — Ambulatory Visit: Payer: Self-pay | Admitting: Genetics

## 2017-07-13 DIAGNOSIS — Z8041 Family history of malignant neoplasm of ovary: Secondary | ICD-10-CM

## 2017-07-13 DIAGNOSIS — D0512 Intraductal carcinoma in situ of left breast: Secondary | ICD-10-CM | POA: Diagnosis not present

## 2017-07-13 DIAGNOSIS — Z800 Family history of malignant neoplasm of digestive organs: Secondary | ICD-10-CM

## 2017-07-13 DIAGNOSIS — Z510 Encounter for antineoplastic radiation therapy: Secondary | ICD-10-CM | POA: Diagnosis not present

## 2017-07-13 DIAGNOSIS — Z1379 Encounter for other screening for genetic and chromosomal anomalies: Secondary | ICD-10-CM

## 2017-07-13 DIAGNOSIS — C50412 Malignant neoplasm of upper-outer quadrant of left female breast: Secondary | ICD-10-CM

## 2017-07-13 DIAGNOSIS — Z170 Estrogen receptor positive status [ER+]: Secondary | ICD-10-CM

## 2017-07-13 DIAGNOSIS — Z8042 Family history of malignant neoplasm of prostate: Secondary | ICD-10-CM

## 2017-07-13 NOTE — Telephone Encounter (Signed)
Told [name] t hat after adding on the thyroid cancer panel, 2 other genes were analyzed on genetic testing and those were negative/normal as well.

## 2017-07-13 NOTE — Progress Notes (Signed)
HPI: [name] was previously seen in the [place] clinic on 06/11/2017 due to a personal and family history of cancer and concerns regarding a hereditary predisposition to cancer. Please refer to our prior cancer genetics clinic note for more information regarding [name]'s medical, social and family histories, and our assessment and recommendations, at the time. [name] recent genetic test results were disclosed to her, as well as recommendations warranted by these results. These results and recommendations are discussed in more detail below.  CANCER HISTORY:    Ductal carcinoma in situ (DCIS) of left breast   04/24/2017 Initial Diagnosis    Screening detected left breast calcifications in 3 areas UOQ, anterior group of calcifications 1.4 cm biopsy revealed high-grade DCIS with calcifications ER 95%, PR 95%, other 2 areas benign fibrocystic changes, Tis N0 stage 0      05/30/2017 Surgery    Left Lumpectomy: DCIS Intermediate Grade with necrosis and calcs, 0.9 cm, Er: 95%, PR 95%, TisNx (Stage 0)      07/06/2017 Genetic Testing    The Common Hereditary Cancer Panel + Thyroid cancer Panel was ordered.  The following genes were evaluated for sequence changes and exonic deletions/duplications: APC, ATM, AXIN2, BARD1, BMPR1A, BRCA1, BRCA2, BRIP1, CDH1, CDK4, CDKN2A (p14ARF), CDKN2A (p16INK4a), CHEK2, CTNNA1, DICER1, EPCAM*, GREM1*, KIT, MEN1, MLH1, MSH2, MSH3, MSH6, MUTYH, NBN, NF1, PALB2, PDGFRA, PMS2, POLD1, POLE, PRKAR1A, PTEN, RAD50, RAD51C, RAD51D, RET, SDHB, SDHC, SDHD, SMAD4, SMARCA4, STK11, TP53, TSC1, TSC2, VHL.  The following genes were evaluated for sequence changes only: HOXB13*, NTHL1*, SDHA  Results: Negative, no pathogenic variants identified.  The date of this test report is 07/06/2017.        FAMILY HISTORY:  We obtained a detailed, 4-generation family history.  Significant diagnoses are listed below: Family History  Problem Relation Age of Onset  .  Colon cancer Mother 61  . Ovarian cancer Mother 50       'radium treatment'  . Cardiomyopathy Mother   . Prostate cancer Maternal Uncle 32       metastatic 'died from it'  . Stomach cancer Maternal Grandmother 69  . Heart disease Maternal Grandmother   . Diabetes Maternal Grandfather   . Pancreatic cancer Maternal Aunt 66  . Prostate cancer Maternal Uncle 72       metastatic/ 'died from it'  . Cervical cancer Daughter   . Thyroid cancer Paternal Uncle   . Thyroid cancer Cousin     [name] has a daughter who is 56 and has a history of cervical cancer.  [name] also has a 107 year-old son with no history of cancer.  [name] has several grandchildren and 1 great-granddaughter who is 18 months. [name] has a brother who is 68 with no history of cancer and no children.   [name] father died in his 54's due to alcohol induced disease.  She does not know very much information about this side of the family.  Her paternal grandmother died in hier 55's with no known history of cancer.   [name] mother was diagnosed with ovarian cancer in her 23's.   She says she received radium treatment.  Based on the treatment, I wonder if this was ovarian cancer or a different gyn cancer.  [name] mother had a thyroid tumor removed in her 110's as well and had colon cancer diagnosed in her 67's.  She had polyps every 2-3 years identified on colonoscopy.   [name] has  2 maternal aunts and 2 maternal uncles listed below: -1 maternal aunt is 4 with no history of cancer.  She has children with no history of cancer.  -1 maternal aunt died of pancreatic cancer in her 78's.   -1 maternal uncle died in his 9's due to metastatic prostate cancer.   -1 maternal uncle died at 41 due to metastatic prostate cancer.   -maternal uncle with thyroid cancer.  [name] reports a maternal cousin with thyroid cancer with any known diagnosis of cancer.  [name] maternal grandfather died  in his 45's with no history of cancer.  [name] maternal grandmother died at 54 with stomach cancer.  She also had heart disease.   [name] is unaware of previous family history of genetic testing for hereditary cancer risks. Patient's maternal ancestors are of [place] descent, and paternal ancestors are of [place]/English descent. There is no reported Ashkenazi Jewish ancestry. There is no known consanguinity.  GENETIC TEST RESULTS: Genetic testing performed through [facility] Common Hereditary Cancers Panel + Thyroid  Cancer Panel reported out on 07/06/2017 showed no pathogenic mutations. The following genes were evaluated for sequence changes and exonic deletions/duplications: APC, ATM, AXIN2, BARD1, BMPR1A, BRCA1, BRCA2, BRIP1, CDH1, CDK4, CDKN2A (p14ARF), CDKN2A (p16INK4a), CHEK2, CTNNA1, DICER1, EPCAM*, GREM1*, KIT, MEN1, MLH1, MSH2, MSH3, MSH6, MUTYH, NBN, NF1, PALB2, PDGFRA, PMS2, POLD1, POLE, PRKAR1A, PTEN, RAD50, RAD51C, RAD51D, RET, SDHB, SDHC, SDHD, SMAD4, SMARCA4, STK11, TP53, TSC1, TSC2, VHL .  The following genes were evaluated for sequence changes only: HOXB13*, NTHL1*, SDHA.  The test report will be scanned into EPIC and will be located under the Molecular Pathology section of the Results Review tab.A portion of the result report is included below for reference.     We discussed with [name] that because current genetic testing is not perfect, it is possible there may be a gene mutation in one of these genes that current testing cannot detect, but that chance is small. We also discussed, that there could be another gene that has not yet been discovered, or that we have not yet tested, that is responsible for the cancer diagnoses in the family. It is also possible there is a hereditary cause for the cancer in the family that [name] did not inherit and therefore was not identified in her testing.  Therefore, it is important to remain in touch with cancer genetics in the  future so that we can continue to offer Ms. Pizzi the most up to date genetic testing.   ADDITIONAL GENETIC TESTING: We discussed with Ms. Hardiman that there are other genes that are associated with increased cancer risk that can be analyzed. The laboratories that offer this testing look at these additional genes via a hereditary cancer gene panel. Should Ms. Camino wish to pursue additional genetic testing, we are happy to discuss and coordinate this testing, at any time.    CANCER SCREENING RECOMMENDATIONS:  Ms. Punches's test result is considered negative (normal).  This means that we have not identified a hereditary cause for her personal and family history of cancer at this time.   While reassuring, this does not definitively rule out a hereditary basis for her cancer. It is still possible that there could be genetic mutations that are undetectable by current technology, or genetic mutations in genes that have not been tested or identified to increase cancer risk.  It is also possible there is a hereditary cause for the caner in Ms. Canales's family that she did  not inherit and therefore was not identified in her.  Therefore, Ms. Renwick was advised to continue following the cancer screening guidelines provided by her primary healthcare providers. Other factors such as her personal and family history may still affect her cancer risk.  We recommended she have cononoscopies every 5 years or as directed by her GI physician given her mother's history of colon cancer.   RECOMMENDATIONS FOR FAMILY MEMBERS: Relatives in this family might be at some increased risk of developing cancer, over the general population risk, simply due to the family history of cancer. We recommended women in this family have a yearly mammogram beginning at age 66, or 59 years younger than the earliest onset of cancer, an annual clinical breast exam, and perform monthly breast self-exams. Women in this family should also have  a gynecological exam as recommended by their primary provider. All family members should have a colonoscopy by age 20 (or earlier based on family history).  We recommend her brother have colonoscopies ever 5 years (or as directed by his GI physician).  All family members should inform their physicians about the family history of cancer so their doctors can make the most appropriate screening recommendations for them.   Based on Ms. Gander's family history, we recommended her brother and all maternal relatives, have genetic counseling and testing. Ms. Gharibian will let us know if we can be of any assistance in coordinating genetic counseling and/or testing for these family members.   FOLLOW-UP: Lastly, we discussed with Ms. Brining that cancer genetics is a rapidly advancing field and it is possible that new genetic tests will be appropriate for her and/or her family members in the future. We encouraged her to remain in contact with cancer genetics on an annual basis so we can update her personal and family histories and let her know of advances in cancer genetics that may benefit this family.   Our contact number was provided. Ms. Charrette questions were answered to her satisfaction, and she knows she is welcome to call us at anytime with additional questions or concerns.   Ferol Luz, MS, [facility] Certified Genetic Counselor [email or web address]

## 2017-07-15 ENCOUNTER — Other Ambulatory Visit: Payer: Self-pay | Admitting: Family Medicine

## 2017-07-16 ENCOUNTER — Ambulatory Visit
Admission: RE | Admit: 2017-07-16 | Discharge: 2017-07-16 | Disposition: A | Discharge status: Home / Self Care | Payer: Medicare HMO | Source: Ambulatory Visit | Attending: Radiation Oncology | Admitting: Radiation Oncology

## 2017-07-16 DIAGNOSIS — D0512 Intraductal carcinoma in situ of left breast: Secondary | ICD-10-CM | POA: Diagnosis not present

## 2017-07-16 DIAGNOSIS — Z510 Encounter for antineoplastic radiation therapy: Secondary | ICD-10-CM | POA: Diagnosis not present

## 2017-07-17 ENCOUNTER — Ambulatory Visit
Admission: RE | Admit: 2017-07-17 | Discharge: 2017-07-17 | Disposition: A | Discharge status: Home / Self Care | Payer: Medicare HMO | Source: Ambulatory Visit | Attending: Radiation Oncology | Admitting: Radiation Oncology

## 2017-07-17 DIAGNOSIS — Z510 Encounter for antineoplastic radiation therapy: Secondary | ICD-10-CM | POA: Diagnosis not present

## 2017-07-17 DIAGNOSIS — D0512 Intraductal carcinoma in situ of left breast: Secondary | ICD-10-CM | POA: Diagnosis not present

## 2017-07-18 ENCOUNTER — Ambulatory Visit
Admission: RE | Admit: 2017-07-18 | Discharge: 2017-07-18 | Disposition: A | Discharge status: Home / Self Care | Payer: Medicare HMO | Source: Ambulatory Visit | Attending: Radiation Oncology | Admitting: Radiation Oncology

## 2017-07-18 DIAGNOSIS — D0512 Intraductal carcinoma in situ of left breast: Secondary | ICD-10-CM | POA: Diagnosis not present

## 2017-07-18 DIAGNOSIS — Z510 Encounter for antineoplastic radiation therapy: Secondary | ICD-10-CM | POA: Diagnosis not present

## 2017-07-19 ENCOUNTER — Ambulatory Visit
Admission: RE | Admit: 2017-07-19 | Discharge: 2017-07-19 | Disposition: A | Discharge status: Home / Self Care | Payer: Medicare HMO | Source: Ambulatory Visit | Attending: Radiation Oncology | Admitting: Radiation Oncology

## 2017-07-19 DIAGNOSIS — Z510 Encounter for antineoplastic radiation therapy: Secondary | ICD-10-CM | POA: Diagnosis not present

## 2017-07-19 DIAGNOSIS — D0512 Intraductal carcinoma in situ of left breast: Secondary | ICD-10-CM | POA: Diagnosis not present

## 2017-07-20 ENCOUNTER — Ambulatory Visit
Admission: RE | Admit: 2017-07-20 | Discharge: 2017-07-20 | Disposition: A | Discharge status: Home / Self Care | Payer: Medicare HMO | Source: Ambulatory Visit | Attending: Radiation Oncology | Admitting: Radiation Oncology

## 2017-07-20 ENCOUNTER — Encounter: Payer: Self-pay | Admitting: Family Medicine

## 2017-07-20 ENCOUNTER — Telehealth: Payer: Self-pay | Admitting: Family Medicine

## 2017-07-20 DIAGNOSIS — Z510 Encounter for antineoplastic radiation therapy: Secondary | ICD-10-CM | POA: Diagnosis not present

## 2017-07-20 DIAGNOSIS — D0512 Intraductal carcinoma in situ of left breast: Secondary | ICD-10-CM | POA: Diagnosis not present

## 2017-07-20 NOTE — Telephone Encounter (Signed)
Requested diabetic eye exam received from [facility]. Sending back for review.

## 2017-07-21 ENCOUNTER — Other Ambulatory Visit: Payer: Self-pay | Admitting: Family Medicine

## 2017-07-21 DIAGNOSIS — I10 Essential (primary) hypertension: Secondary | ICD-10-CM

## 2017-07-23 ENCOUNTER — Ambulatory Visit
Admission: RE | Admit: 2017-07-23 | Discharge: 2017-07-23 | Disposition: A | Discharge status: Home / Self Care | Payer: Medicare HMO | Source: Ambulatory Visit | Attending: Radiation Oncology | Admitting: Radiation Oncology

## 2017-07-23 DIAGNOSIS — Z510 Encounter for antineoplastic radiation therapy: Secondary | ICD-10-CM | POA: Diagnosis not present

## 2017-07-23 DIAGNOSIS — D0512 Intraductal carcinoma in situ of left breast: Secondary | ICD-10-CM | POA: Diagnosis not present

## 2017-07-24 ENCOUNTER — Ambulatory Visit
Admission: RE | Admit: 2017-07-24 | Discharge: 2017-07-24 | Disposition: A | Discharge status: Home / Self Care | Payer: Medicare HMO | Source: Ambulatory Visit | Attending: Radiation Oncology | Admitting: Radiation Oncology

## 2017-07-24 DIAGNOSIS — Z510 Encounter for antineoplastic radiation therapy: Secondary | ICD-10-CM | POA: Diagnosis not present

## 2017-07-24 DIAGNOSIS — D0512 Intraductal carcinoma in situ of left breast: Secondary | ICD-10-CM | POA: Diagnosis not present

## 2017-07-25 ENCOUNTER — Ambulatory Visit
Admission: RE | Admit: 2017-07-25 | Discharge: 2017-07-25 | Disposition: A | Discharge status: Home / Self Care | Payer: Medicare HMO | Source: Ambulatory Visit | Attending: Radiation Oncology | Admitting: Radiation Oncology

## 2017-07-25 DIAGNOSIS — D0512 Intraductal carcinoma in situ of left breast: Secondary | ICD-10-CM | POA: Diagnosis not present

## 2017-07-25 DIAGNOSIS — Z510 Encounter for antineoplastic radiation therapy: Secondary | ICD-10-CM | POA: Diagnosis not present

## 2017-07-26 ENCOUNTER — Telehealth: Payer: Self-pay

## 2017-07-26 ENCOUNTER — Telehealth: Payer: Self-pay | Admitting: Endocrinology

## 2017-07-26 ENCOUNTER — Ambulatory Visit
Admission: RE | Admit: 2017-07-26 | Discharge: 2017-07-26 | Disposition: A | Discharge status: Home / Self Care | Payer: Medicare HMO | Source: Ambulatory Visit | Attending: Radiation Oncology | Admitting: Radiation Oncology

## 2017-07-26 ENCOUNTER — Other Ambulatory Visit: Payer: Self-pay

## 2017-07-26 DIAGNOSIS — IMO0001 Reserved for inherently not codable concepts without codable children: Secondary | ICD-10-CM

## 2017-07-26 DIAGNOSIS — Z510 Encounter for antineoplastic radiation therapy: Secondary | ICD-10-CM | POA: Diagnosis not present

## 2017-07-26 DIAGNOSIS — E1165 Type 2 diabetes mellitus with hyperglycemia: Principal | ICD-10-CM

## 2017-07-26 DIAGNOSIS — D0512 Intraductal carcinoma in situ of left breast: Secondary | ICD-10-CM | POA: Diagnosis not present

## 2017-07-26 DIAGNOSIS — I10 Essential (primary) hypertension: Secondary | ICD-10-CM

## 2017-07-26 MED ORDER — DAPAGLIFLOZIN PROPANEDIOL 5 MG PO TABS: 5.0000 mg | ORAL_TABLET | Freq: Every day | ORAL | 5 refills | Status: DC

## 2017-07-26 MED ORDER — LOSARTAN POTASSIUM-HCTZ 50-12.5 MG PO TABS: 1.0000 | ORAL_TABLET | Freq: Every day | ORAL | 1 refills | Status: DC

## 2017-07-26 MED ORDER — GLIPIZIDE 5 MG PO TABS: 2.5000 mg | ORAL_TABLET | Freq: Every day | ORAL | 5 refills | Status: DC

## 2017-07-26 NOTE — Telephone Encounter (Signed)
Pt was called to schedule an awv with vickie and pt is coming in to see her in july and can make the appt at that time. [name]

## 2017-07-26 NOTE — Telephone Encounter (Signed)
Patient need refill for medication glipiZIDE (GLUCOTROL) 5 MG tablet (she said it was supposed to changed to 1/2 tabs) losartan-hydrochlorothiazide (HYZAAR) 50-12.5 MG tablet FARXIGA 5 MG TABS tablet  [id number] [name], [place] [address]. [phone] (Phone) [phone] (Fax)

## 2017-07-26 NOTE — Telephone Encounter (Signed)
I have sent to patient;'s pharmacy.  

## 2017-07-27 ENCOUNTER — Ambulatory Visit
Admission: RE | Admit: 2017-07-27 | Discharge: 2017-07-27 | Disposition: A | Discharge status: Home / Self Care | Payer: Medicare HMO | Source: Ambulatory Visit | Attending: Radiation Oncology | Admitting: Radiation Oncology

## 2017-07-27 DIAGNOSIS — D0512 Intraductal carcinoma in situ of left breast: Secondary | ICD-10-CM | POA: Diagnosis not present

## 2017-07-27 DIAGNOSIS — Z510 Encounter for antineoplastic radiation therapy: Secondary | ICD-10-CM | POA: Diagnosis not present

## 2017-07-30 ENCOUNTER — Ambulatory Visit
Admission: RE | Admit: 2017-07-30 | Discharge: 2017-07-30 | Disposition: A | Discharge status: Home / Self Care | Payer: Medicare HMO | Source: Ambulatory Visit | Attending: Radiation Oncology | Admitting: Radiation Oncology

## 2017-07-30 ENCOUNTER — Ambulatory Visit: Payer: Medicare HMO | Admitting: Radiation Oncology

## 2017-07-30 DIAGNOSIS — D0512 Intraductal carcinoma in situ of left breast: Secondary | ICD-10-CM | POA: Diagnosis not present

## 2017-07-30 DIAGNOSIS — Z510 Encounter for antineoplastic radiation therapy: Secondary | ICD-10-CM | POA: Diagnosis not present

## 2017-07-31 ENCOUNTER — Ambulatory Visit
Admission: RE | Admit: 2017-07-31 | Discharge: 2017-07-31 | Disposition: A | Discharge status: Home / Self Care | Payer: Medicare HMO | Source: Ambulatory Visit | Attending: Radiation Oncology | Admitting: Radiation Oncology

## 2017-07-31 DIAGNOSIS — Z510 Encounter for antineoplastic radiation therapy: Secondary | ICD-10-CM | POA: Diagnosis not present

## 2017-07-31 DIAGNOSIS — D0512 Intraductal carcinoma in situ of left breast: Secondary | ICD-10-CM | POA: Diagnosis not present

## 2017-08-01 ENCOUNTER — Telehealth: Payer: Self-pay | Admitting: Hematology and Oncology

## 2017-08-01 ENCOUNTER — Ambulatory Visit
Admission: RE | Admit: 2017-08-01 | Discharge: 2017-08-01 | Disposition: A | Discharge status: Home / Self Care | Payer: Medicare HMO | Source: Ambulatory Visit | Attending: Radiation Oncology | Admitting: Radiation Oncology

## 2017-08-01 DIAGNOSIS — D0512 Intraductal carcinoma in situ of left breast: Secondary | ICD-10-CM | POA: Diagnosis not present

## 2017-08-01 DIAGNOSIS — Z510 Encounter for antineoplastic radiation therapy: Secondary | ICD-10-CM | POA: Diagnosis not present

## 2017-08-01 NOTE — Telephone Encounter (Signed)
Spoke to patient regarding voicemail she left earlier. Patients May appointment was r/s to April

## 2017-08-02 ENCOUNTER — Ambulatory Visit
Admission: RE | Admit: 2017-08-02 | Discharge: 2017-08-02 | Disposition: A | Discharge status: Home / Self Care | Payer: Medicare HMO | Source: Ambulatory Visit | Attending: Radiation Oncology | Admitting: Radiation Oncology

## 2017-08-02 DIAGNOSIS — D0512 Intraductal carcinoma in situ of left breast: Secondary | ICD-10-CM | POA: Diagnosis not present

## 2017-08-02 DIAGNOSIS — Z510 Encounter for antineoplastic radiation therapy: Secondary | ICD-10-CM | POA: Diagnosis not present

## 2017-08-03 ENCOUNTER — Ambulatory Visit
Admission: RE | Admit: 2017-08-03 | Discharge: 2017-08-03 | Disposition: A | Discharge status: Home / Self Care | Payer: Medicare HMO | Source: Ambulatory Visit | Attending: Radiation Oncology | Admitting: Radiation Oncology

## 2017-08-03 DIAGNOSIS — Z510 Encounter for antineoplastic radiation therapy: Secondary | ICD-10-CM | POA: Diagnosis not present

## 2017-08-03 DIAGNOSIS — D0512 Intraductal carcinoma in situ of left breast: Secondary | ICD-10-CM | POA: Diagnosis not present

## 2017-08-04 ENCOUNTER — Other Ambulatory Visit: Payer: Self-pay | Admitting: Family Medicine

## 2017-08-04 DIAGNOSIS — IMO0001 Reserved for inherently not codable concepts without codable children: Secondary | ICD-10-CM

## 2017-08-04 DIAGNOSIS — E1165 Type 2 diabetes mellitus with hyperglycemia: Principal | ICD-10-CM

## 2017-08-06 ENCOUNTER — Ambulatory Visit
Admission: RE | Admit: 2017-08-06 | Discharge: 2017-08-06 | Disposition: A | Discharge status: Home / Self Care | Payer: Medicare HMO | Source: Ambulatory Visit | Attending: Radiation Oncology | Admitting: Radiation Oncology

## 2017-08-06 DIAGNOSIS — Z510 Encounter for antineoplastic radiation therapy: Secondary | ICD-10-CM | POA: Diagnosis not present

## 2017-08-06 DIAGNOSIS — D0512 Intraductal carcinoma in situ of left breast: Secondary | ICD-10-CM | POA: Diagnosis not present

## 2017-08-06 NOTE — Telephone Encounter (Signed)
Pt is seeing an endocrinologist now

## 2017-08-07 ENCOUNTER — Telehealth: Payer: Self-pay | Admitting: Hematology and Oncology

## 2017-08-07 ENCOUNTER — Inpatient Hospital Stay: Payer: Medicare HMO | Attending: Hematology and Oncology | Admitting: Hematology and Oncology

## 2017-08-07 DIAGNOSIS — D0512 Intraductal carcinoma in situ of left breast: Secondary | ICD-10-CM | POA: Diagnosis not present

## 2017-08-07 MED ORDER — TAMOXIFEN CITRATE 20 MG PO TABS: 20.0000 mg | ORAL_TABLET | Freq: Every day | ORAL | 3 refills | Status: DC

## 2017-08-07 NOTE — Assessment & Plan Note (Signed)
05/30/17: Left Lumpectomy: DCIS Intermediate Grade with necrosis and calcs, 0.9 cm, Er: 95%, PR 95%, TisNx (Stage 0) Adj RT: 07/10/2017-08/06/2017  Plan: Adj Tamoxifen 20 mg daily X 5 years  Tamoxifen counseling:We discussed the risks and benefits of tamoxifen. These include but not limited to insomnia, hot flashes, mood changes, vaginal dryness, and weight gain. Although rare, serious side effects including endometrial cancer, risk of blood clots were also discussed. We strongly believe that the benefits far outweigh the risks. Patient understands these risks and consented to starting treatment. Planned treatment duration is 5 years.  Return to clinic in 3 months for survivorship care plan visit

## 2017-08-07 NOTE — Progress Notes (Signed)
Patient Care Team: Girtha Rm, NP-C as PCP - General (Family Medicine)  DIAGNOSIS:  Encounter Diagnosis  Name Primary?  . Ductal carcinoma in situ (DCIS) of left breast     SUMMARY OF ONCOLOGIC HISTORY:   Ductal carcinoma in situ (DCIS) of left breast   04/24/2017 Initial Diagnosis    Screening detected left breast calcifications in 3 areas UOQ, anterior group of calcifications 1.4 cm biopsy revealed high-grade DCIS with calcifications ER 95%, PR 95%, other 2 areas benign fibrocystic changes, Tis N0 stage 0      05/30/2017 Surgery    Left Lumpectomy: DCIS Intermediate Grade with necrosis and calcs, 0.9 cm, Er: 95%, PR 95%, TisNx (Stage 0)      07/06/2017 Genetic Testing    The Common Hereditary Cancer Panel + Thyroid cancer Panel was ordered.  The following genes were evaluated for sequence changes and exonic deletions/duplications: APC, ATM, AXIN2, BARD1, BMPR1A, BRCA1, BRCA2, BRIP1, CDH1, CDK4, CDKN2A (p14ARF), CDKN2A (p16INK4a), CHEK2, CTNNA1, DICER1, EPCAM*, GREM1*, KIT, MEN1, MLH1, MSH2, MSH3, MSH6, MUTYH, NBN, NF1, PALB2, PDGFRA, PMS2, POLD1, POLE, PRKAR1A, PTEN, RAD50, RAD51C, RAD51D, RET, SDHB, SDHC, SDHD, SMAD4, SMARCA4, STK11, TP53, TSC1, TSC2, VHL.  The following genes were evaluated for sequence changes only: HOXB13*, NTHL1*, SDHA  Results: Negative, no pathogenic variants identified.  The date of this test report is 07/06/2017.      07/10/2017 - 08/06/2017 Radiation Therapy    Adjuvant radiation therapy      08/07/2017 -  Anti-estrogen oral therapy    Adjuvant antiestrogen therapy with tamoxifen 20 mg daily x5 years       CHIEF COMPLIANT: Follow-up after radiation therapy to discuss antiestrogen therapy  INTERVAL HISTORY: [name] is a 43-year with above-mentioned history of left breast DCIS underwent lumpectomy and radiation therapy and is here today to discuss starting antiestrogen therapy with tamoxifen.  She had tolerated radiation therapy fairly well.   She does have mild radiation dermatitis.  REVIEW OF SYSTEMS:   Constitutional: Denies fevers, chills or abnormal weight loss Eyes: Denies blurriness of vision Ears, nose, mouth, throat, and face: Denies mucositis or sore throat Respiratory: Denies cough, dyspnea or wheezes Cardiovascular: Denies palpitation, chest discomfort Gastrointestinal:  Denies nausea, heartburn or change in bowel habits Skin: Denies abnormal skin rashes Lymphatics: Denies new lymphadenopathy or easy bruising Neurological:Denies numbness, tingling or new weaknesses Behavioral/Psych: Mood is stable, no new changes  Extremities: No lower extremity edema Breast: Mild radiation dermatitis All other systems were reviewed with the patient and are negative.  I have reviewed the past medical history, past surgical history, social history and family history with the patient and they are unchanged from previous note.  ALLERGIES:  is allergic to atorvastatin; fosamax [alendronate sodium]; lisinopril; and tetracycline.  MEDICATIONS:  Current Outpatient Medications  Medication Sig Dispense Refill  . ACCU-CHEK AVIVA PLUS test strip TEST TWICE A DAY. NEEDS AN ACCU-CHEK METER AS WELL. DX CODE- E11.9 100 each 5  . aspirin 325 MG EC tablet Take 325 mg by mouth daily.    . Blood Glucose Monitoring Suppl (ACCU-CHEK AVIVA PLUS) w/Device KIT USE AS DIRECTED 2 TIMES DAILY 1 kit 0  . dapagliflozin propanediol (FARXIGA) 5 MG TABS tablet Take 5 mg by mouth daily. 30 tablet 5  . glipiZIDE (GLUCOTROL) 5 MG tablet Take 0.5 tablets (2.5 mg total) by mouth daily before breakfast. 30 tablet 5  . losartan-hydrochlorothiazide (HYZAAR) 50-12.5 MG tablet Take 1 tablet by mouth daily. 30 tablet 1  . metFORMIN (  GLUCOPHAGE) 1000 MG tablet TAKE 1 TABLET BY MOUTH TWICE A DAY WITH A MEAL 180 tablet 0  . metoprolol tartrate (LOPRESSOR) 25 MG tablet TAKE 1 TABLET BY MOUTH TWICE A DAY 60 tablet 2  . OVER THE COUNTER MEDICATION Calcium 1200 with Vitamin D  1000- 1 tab daily    . OVER THE COUNTER MEDICATION Saffron Extract 88.50 mg - 1 daily    . pravastatin (PRAVACHOL) 20 MG tablet TAKE 1 TABLET BY MOUTH EVERY DAY 30 tablet 5  . sitaGLIPtin (JANUVIA) 100 MG tablet Take 1 tablet (100 mg total) by mouth daily. 30 tablet 11  . tamoxifen (NOLVADEX) 20 MG tablet Take 1 tablet (20 mg total) by mouth daily. 90 tablet 3  . vitamin C (ASCORBIC ACID) 500 MG tablet Take 500 mg by mouth daily.     No current facility-administered medications for this visit.     PHYSICAL EXAMINATION: ECOG PERFORMANCE STATUS: 1 - Symptomatic but completely ambulatory  Vitals:   08/07/17 1444  BP: (!) 174/80  Pulse: (!) 106  Resp: 19  Temp: 98.3 F (36.8 C)  SpO2: 97%   Filed Weights   08/07/17 1444  Weight: 209 lb 3.2 oz (94.9 kg)    GENERAL:alert, no distress and comfortable SKIN: skin color, texture, turgor are normal, no rashes or significant lesions EYES: normal, Conjunctiva are pink and non-injected, sclera clear OROPHARYNX:no exudate, no erythema and lips, buccal mucosa, and tongue normal  NECK: supple, thyroid normal size, non-tender, without nodularity LYMPH:  no palpable lymphadenopathy in the cervical, axillary or inguinal LUNGS: clear to auscultation and percussion with normal breathing effort HEART: regular rate & rhythm and no murmurs and no lower extremity edema ABDOMEN:abdomen soft, non-tender and normal bowel sounds MUSCULOSKELETAL:no cyanosis of digits and no clubbing  NEURO: alert & oriented x 3 with fluent speech, no focal motor/sensory deficits EXTREMITIES: No lower extremity edema  LABORATORY DATA:  I have reviewed the data as listed CMP Latest Ref Rng & Units 05/24/2017 05/02/2017 10/09/2016  Glucose 65 - 99 mg/dL 154(H) 158(H) 194(H)  BUN 6 - 20 mg/dL '10 13 11  ' Creatinine 0.44 - 1.00 mg/dL 0.65 0.75 0.57  Sodium 135 - 145 mmol/L 140 140 135  Potassium 3.5 - 5.1 mmol/L 4.1 4.3 4.5  Chloride 101 - 111 mmol/L 107 105 99  CO2 22 - 32  mmol/L 20(L) 26 23  Calcium 8.9 - 10.3 mg/dL 9.1 9.3 9.1  Total Protein 6.4 - 8.3 g/dL - 6.9 6.6  Total Bilirubin 0.2 - 1.2 mg/dL - 0.5 0.6  Alkaline Phos 40 - 150 U/L - 62 73  AST 5 - 34 U/L - 23 17  ALT 0 - 55 U/L - 25 22    Lab Results  Component Value Date   WBC 11.9 (H) 05/24/2017   HGB 13.0 05/24/2017   HCT 39.6 05/24/2017   MCV 82.3 05/24/2017   PLT 343 05/24/2017   NEUTROABS 7.4 (H) 05/02/2017    ASSESSMENT & PLAN:  Ductal carcinoma in situ (DCIS) of left breast 05/30/17: Left Lumpectomy: DCIS Intermediate Grade with necrosis and calcs, 0.9 cm, Er: 95%, PR 95%, TisNx (Stage 0) Adj RT: 07/10/2017-08/06/2017  Plan: Adj Tamoxifen 20 mg daily X 5 years  Tamoxifen counseling:We discussed the risks and benefits of tamoxifen. These include but not limited to insomnia, hot flashes, mood changes, vaginal dryness, and weight gain. Although rare, serious side effects including endometrial cancer, risk of blood clots were also discussed. We strongly believe that the benefits  far outweigh the risks. Patient understands these risks and consented to starting treatment. Planned treatment duration is 5 years.  Return to clinic in 3 months for survivorship care plan visit  No orders of the defined types were placed in this encounter.  The patient has a good understanding of the overall plan. she agrees with it. she will call with any problems that may develop before the next visit here.   Harriette Ohara, MD 08/07/17

## 2017-08-07 NOTE — Telephone Encounter (Signed)
Gave avs and calendar ° °

## 2017-08-08 ENCOUNTER — Ambulatory Visit: Payer: Medicare HMO | Admitting: Hematology and Oncology

## 2017-08-09 ENCOUNTER — Other Ambulatory Visit: Payer: Self-pay

## 2017-08-09 MED ORDER — TRUEDRAW LANCING DEVICE MISC: 1.0000 | 0 refills | Status: DC | PRN

## 2017-08-09 MED ORDER — TRUE METRIX LEVEL 1 LOW VI SOLN: 1.0000 | 0 refills | Status: DC | PRN

## 2017-08-09 MED ORDER — TAMOXIFEN CITRATE 20 MG PO TABS: 20.0000 mg | ORAL_TABLET | Freq: Every day | ORAL | 3 refills | Status: DC

## 2017-08-10 ENCOUNTER — Other Ambulatory Visit: Payer: Self-pay

## 2017-08-10 ENCOUNTER — Telehealth: Payer: Self-pay

## 2017-08-10 DIAGNOSIS — E1165 Type 2 diabetes mellitus with hyperglycemia: Principal | ICD-10-CM

## 2017-08-10 DIAGNOSIS — IMO0001 Reserved for inherently not codable concepts without codable children: Secondary | ICD-10-CM

## 2017-08-10 MED ORDER — METFORMIN HCL 1000 MG PO TABS: ORAL_TABLET | ORAL | 0 refills | Status: DC

## 2017-08-10 MED ORDER — PRAVASTATIN SODIUM 20 MG PO TABS: 20.0000 mg | ORAL_TABLET | Freq: Every day | ORAL | 5 refills | Status: DC

## 2017-08-10 MED ORDER — GLUCOSE BLOOD VI STRP: ORAL_STRIP | 12 refills | Status: AC

## 2017-08-10 MED ORDER — GLIPIZIDE 5 MG PO TABS: 2.5000 mg | ORAL_TABLET | Freq: Every day | ORAL | 5 refills | Status: DC

## 2017-08-10 NOTE — Telephone Encounter (Signed)
Done [place] 08-10-17

## 2017-08-10 NOTE — Telephone Encounter (Signed)
Received fax from [facility] wanting patient's Metoprolol Tartrate 25 mg tabs and Pravastatin 20 mg tabs sent to them.   Phone: [phone] Fax:  [phone]

## 2017-08-13 ENCOUNTER — Telehealth: Payer: Self-pay

## 2017-08-13 DIAGNOSIS — I10 Essential (primary) hypertension: Secondary | ICD-10-CM

## 2017-08-13 MED ORDER — METOPROLOL TARTRATE 25 MG PO TABS: 25.0000 mg | ORAL_TABLET | Freq: Two times a day (BID) | ORAL | 1 refills | Status: DC

## 2017-08-13 MED ORDER — PRAVASTATIN SODIUM 20 MG PO TABS: 20.0000 mg | ORAL_TABLET | Freq: Every day | ORAL | 1 refills | Status: DC

## 2017-08-13 NOTE — Telephone Encounter (Signed)
Please take care of this.  

## 2017-08-13 NOTE — Telephone Encounter (Signed)
Received fax for refill authorization for Metoprolol Tartrate 25mg  and Pravastatin 20mg  tab.

## 2017-08-13 NOTE — Telephone Encounter (Signed)
done

## 2017-08-14 ENCOUNTER — Other Ambulatory Visit: Payer: Self-pay | Admitting: *Deleted

## 2017-08-14 DIAGNOSIS — E1165 Type 2 diabetes mellitus with hyperglycemia: Principal | ICD-10-CM

## 2017-08-14 DIAGNOSIS — I10 Essential (primary) hypertension: Secondary | ICD-10-CM

## 2017-08-14 DIAGNOSIS — IMO0001 Reserved for inherently not codable concepts without codable children: Secondary | ICD-10-CM

## 2017-08-14 MED ORDER — TRUE METRIX LEVEL 1 LOW VI SOLN: 1.0000 | 0 refills | Status: DC | PRN

## 2017-08-14 MED ORDER — TRUEDRAW LANCING DEVICE MISC: 1.0000 | 0 refills | Status: DC | PRN

## 2017-08-14 MED ORDER — GLIPIZIDE 5 MG PO TABS: 2.5000 mg | ORAL_TABLET | Freq: Every day | ORAL | 1 refills | Status: DC

## 2017-08-14 MED ORDER — LOSARTAN POTASSIUM-HCTZ 50-12.5 MG PO TABS: 1.0000 | ORAL_TABLET | Freq: Every day | ORAL | 1 refills | Status: DC

## 2017-08-17 ENCOUNTER — Encounter: Payer: Self-pay | Admitting: Radiation Oncology

## 2017-08-17 NOTE — Progress Notes (Signed)
  Radiation Oncology         [phone]) [phone] ________________________________  Name: [name] MRN: [id number]  Date: 08/17/2017  DOB: December 12, 1945  End of Treatment Note  Diagnosis:    Stage0 Tis N0M0LeftBreast UOQDuctal carcinoma in situ, ER+/ PR+,Intermediate grade     Indication for treatment:  Curative       Radiation treatment dates:   07/10/17 - 08/06/17  Site/dose:   40.05 Gy directed to the left breast in 15 fractions, followed by a boost of 10 Gy in 5 fractions.  Beams/energy:  Breast:  3D/ 6X and 10X  Boost: Electrons / 15MeV  Narrative: The patient tolerated radiation treatment relatively well. She denies having any pain. She does have some fatigue. She has slight hyperpigmentation to her left breast.  Plan: The patient has completed radiation treatment. The patient will return to radiation oncology clinic for routine followup in one month. I advised them to call or return sooner if they have any questions or concerns related to their recovery or treatment.  -----------------------------------  Eppie Gibson, MD

## 2017-08-30 ENCOUNTER — Encounter: Payer: Self-pay | Admitting: Radiation Oncology

## 2017-08-30 NOTE — Progress Notes (Signed)
Ms. Papania presents for follow up of radiation completed 08/06/17 to her Left Breast. She has hyperpigmentation present to her Left Breast. She continues to use Radiaplex to her radiation site. She will switch to a vitamin E containing lotion when the radiaplex is completed. She continues to report fatigue.  She saw Dr. Lindi Adie on 08/07/17. She is taking Tamoxifen 20 mg daily and will see survivorship on 11/06/17.   BP (!) 158/72 (BP Location: Left Arm, Patient Position: Sitting, Cuff Size: Normal)   Pulse 67   Temp 98.4 F (36.9 C) (Oral)   Resp 20   Wt 205 lb 9.6 oz (93.3 kg)   SpO2 97%   BMI 34.21 kg/m    Wt Readings from Last 3 Encounters:  09/07/17 205 lb 9.6 oz (93.3 kg)  08/07/17 209 lb 3.2 oz (94.9 kg)  07/10/17 204 lb 6.4 oz (92.7 kg)

## 2017-09-07 ENCOUNTER — Other Ambulatory Visit: Payer: Self-pay

## 2017-09-07 ENCOUNTER — Encounter: Payer: Self-pay | Admitting: Radiation Oncology

## 2017-09-07 ENCOUNTER — Ambulatory Visit
Admission: RE | Admit: 2017-09-07 | Discharge: 2017-09-07 | Disposition: A | Discharge status: Home / Self Care | Payer: Medicare HMO | Source: Ambulatory Visit | Attending: Radiation Oncology | Admitting: Radiation Oncology

## 2017-09-07 VITALS — BP 158/72 | HR 67 | Temp 98.4°F | Resp 20 | Wt 205.6 lb

## 2017-09-07 DIAGNOSIS — Z7982 Long term (current) use of aspirin: Secondary | ICD-10-CM | POA: Diagnosis not present

## 2017-09-07 DIAGNOSIS — Z79810 Long term (current) use of selective estrogen receptor modulators (SERMs): Secondary | ICD-10-CM | POA: Diagnosis not present

## 2017-09-07 DIAGNOSIS — Z170 Estrogen receptor positive status [ER+]: Secondary | ICD-10-CM | POA: Diagnosis not present

## 2017-09-07 DIAGNOSIS — C50412 Malignant neoplasm of upper-outer quadrant of left female breast: Secondary | ICD-10-CM | POA: Diagnosis not present

## 2017-09-07 DIAGNOSIS — Z923 Personal history of irradiation: Secondary | ICD-10-CM | POA: Insufficient documentation

## 2017-09-07 DIAGNOSIS — Z79899 Other long term (current) drug therapy: Secondary | ICD-10-CM | POA: Diagnosis not present

## 2017-09-07 HISTORY — DX: Personal history of irradiation: Z92.3

## 2017-09-07 NOTE — Progress Notes (Signed)
Radiation Oncology         ([phone]) [phone] ________________________________  Name: [name] MRN: [id number]  Date: 09/07/2017  DOB: 1945/07/16  Follow-Up Visit Note  Outpatient  CC: Girtha Rm, NP-C  Erroll Luna, MD   ICD-10-CM   1. Carcinoma of upper-outer quadrant of left breast in female, estrogen receptor positive ([place]) C50.412    Z17.0     Diagnosis:   Stage0 Tis N0M0LeftBreast UOQDuctal carcinoma in situ, ER+/ PR+,Intermediategrade.             Previous Radiation:  40.05 Gy directed to the Left Breast delivered in 15 fractions, followed by a boost of 10 Gy in 5 fraction completed on 07/10/17 - 08/06/17  Narrative:  The patient returns today for routine follow-up.  She is doing well. She denies any new palpable lumps or bumps in her breasts. She continues to be followed by medical oncology.  She continues to use Radiaplex to her radiation site. She will switch to a vitamin E containing lotion when the radiaplex is completed. She continues to report fatigue.  She saw Dr. Lindi Adie on 08/07/17. She is taking Tamoxifen 20 mg daily.                      ALLERGIES:  is allergic to atorvastatin; fosamax [alendronate sodium]; lisinopril; and tetracycline.  Meds: Current Outpatient Medications  Medication Sig Dispense Refill  . aspirin 325 MG EC tablet Take 325 mg by mouth daily.    . Blood Glucose Calibration (TRUE METRIX LEVEL 1) Low SOLN 1 Bottle by In Vitro route as needed. 1 each 0  . Blood Glucose Monitoring Suppl (ACCU-CHEK AVIVA PLUS) w/Device KIT USE AS DIRECTED 2 TIMES DAILY 1 kit 0  . glipiZIDE (GLUCOTROL) 5 MG tablet Take 0.5 tablets (2.5 mg total) by mouth daily before breakfast. 90 tablet 1  . glucose blood test strip Used to check blood sugars twice daily. DX code E11.9. 100 each 12  . Lancet Devices (TRUEDRAW LANCING DEVICE) MISC 1 Device by Does not apply route as needed. 1 each 0  . losartan-hydrochlorothiazide (HYZAAR) 50-12.5 MG tablet Take 1 tablet  by mouth daily. 90 tablet 1  . metFORMIN (GLUCOPHAGE) 1000 MG tablet TAKE 1 TABLET BY MOUTH TWICE A DAY WITH A MEAL 180 tablet 0  . metoprolol tartrate (LOPRESSOR) 25 MG tablet Take 1 tablet (25 mg total) by mouth 2 (two) times daily. 180 tablet 1  . OVER THE COUNTER MEDICATION Calcium 1200 with Vitamin D 1000- 1 tab daily    . OVER THE COUNTER MEDICATION Saffron Extract 88.50 mg - 1 daily    . pravastatin (PRAVACHOL) 20 MG tablet Take 1 tablet (20 mg total) by mouth daily. 90 tablet 1  . PROLIA 60 MG/ML SOLN injection   0  . sitaGLIPtin (JANUVIA) 100 MG tablet Take 1 tablet (100 mg total) by mouth daily. 30 tablet 11  . tamoxifen (NOLVADEX) 20 MG tablet Take 1 tablet (20 mg total) by mouth daily. 90 tablet 3  . vitamin C (ASCORBIC ACID) 500 MG tablet Take 500 mg by mouth daily.    . dapagliflozin propanediol (FARXIGA) 5 MG TABS tablet Take 5 mg by mouth daily. (Patient not taking: Reported on 09/07/2017) 30 tablet 5   No current facility-administered medications for this encounter.     Physical Findings: The patient is in no acute distress. Patient is alert and oriented.  weight is 205 lb 9.6 oz (93.3 kg). Her oral temperature  is 98.4 F (36.9 C). Her blood pressure is 158/72 (abnormal) and her pulse is 67. Her respiration is 20 and oxygen saturation is 97%. .  Some residual hyperpigmention over her left breast, particularly on the nipple area. Skin is somewhat dry.  Lab Findings: Lab Results  Component Value Date   WBC 11.9 (H) 05/24/2017   HGB 13.0 05/24/2017   HCT 39.6 05/24/2017   MCV 82.3 05/24/2017   PLT 343 05/24/2017      Radiographic Findings: No results found.  Impression/Plan: This is a very pleasant woman with a history of left breast cancer.  She knows continue yearly mammography. I will see her back as needed. I advised her to continue to use vitamin E oil around breast BID x 3 mo where radiation treatment was given and to continue with tamoxifen. I encouraged her to  call if she has any issues in the interim. She will see survivorship on 11/06/17.    _____________________________________   Eppie Gibson, MD  This document serves as a record of services personally performed by Eppie Gibson MD. It was created on her behalf by Delton Coombes, a trained medical scribe. The creation of this record is based on the scribe's personal observations and the provider's statements to them.

## 2017-09-10 ENCOUNTER — Other Ambulatory Visit: Payer: Self-pay | Admitting: Family Medicine

## 2017-09-10 ENCOUNTER — Encounter: Payer: Self-pay | Admitting: Endocrinology

## 2017-09-10 ENCOUNTER — Ambulatory Visit (INDEPENDENT_AMBULATORY_CARE_PROVIDER_SITE_OTHER): Payer: Medicare HMO | Admitting: Endocrinology

## 2017-09-10 VITALS — BP 156/78 | HR 78 | Ht 65.0 in | Wt 208.0 lb

## 2017-09-10 DIAGNOSIS — E119 Type 2 diabetes mellitus without complications: Secondary | ICD-10-CM | POA: Diagnosis not present

## 2017-09-10 DIAGNOSIS — E1165 Type 2 diabetes mellitus with hyperglycemia: Principal | ICD-10-CM

## 2017-09-10 DIAGNOSIS — IMO0001 Reserved for inherently not codable concepts without codable children: Secondary | ICD-10-CM

## 2017-09-10 LAB — POCT GLYCOSYLATED HEMOGLOBIN (HGB A1C): HEMOGLOBIN A1C: 7.3 % — AB (ref 4.0–5.6)

## 2017-09-10 MED ORDER — REPAGLINIDE 1 MG PO TABS: 1.0000 mg | ORAL_TABLET | Freq: Two times a day (BID) | ORAL | 11 refills | Status: DC

## 2017-09-10 MED ORDER — REPAGLINIDE 1 MG PO TABS: 1.0000 mg | ORAL_TABLET | Freq: Two times a day (BID) | ORAL | 3 refills | Status: DC

## 2017-09-10 NOTE — Patient Instructions (Addendum)
Your blood pressure is high today.  Please see your primary care provider soon, to have it rechecked. I have sent a prescription to your pharmacy, to change glipizide to "repaglinide" Please continue the same other diabetes medications check your blood sugar once a day.  vary the time of day when you check, between before the 3 meals, and at bedtime.  also check if you have symptoms of your blood sugar being too high or too low.  please keep a record of the readings and bring it to your next appointment here (or you can bring the meter itself).  You can write it on any piece of paper.  please call us sooner if your blood sugar goes below 70, or if you have a lot of readings over 200.   Please come back for a follow-up appointment in 3 months.

## 2017-09-10 NOTE — Telephone Encounter (Signed)
Pt now sees endocrinology

## 2017-09-10 NOTE — Progress Notes (Signed)
Subjective:    Patient ID: [name], female    DOB: [birth year], 71 y.o.   MRN: [id number]  HPI Pt returns for f/u of diabetes mellitus: DM type: 2 Dx'ed: 2841 Complications: none Therapy: 3 oral meds GDM: 1985 DKA: never Severe hypoglycemia: never Pancreatitis: never Pancreatic imaging: never Other: she has never been on insulin Interval history: pt states she feels well in general.  she brings a record of her cbg's which I have reviewed today.  It varies from 98-185.  There is no trend throughout the day.  She did not take farxiga, due to cost.   Past Medical History:  Diagnosis Date  . Abnormal bone density screening   . Arthritis   . Complication of anesthesia    "i wake up"  . Diverticulosis   . DM (diabetes mellitus) ([place])   . Family history of colon cancer   . Family history of ovarian cancer   . Family history of pancreatic cancer   . Family history of prostate cancer   . Family history of stomach cancer   . GERD (gastroesophageal reflux disease)   . History of radiation therapy 07/10/17- 08/06/17   40.05 Gy directed to the left breast in 15 fractions, followed by a boost of 10 Gy in 5 fractions.   [place] Kitchen HTN (hypertension)   . Hyperlipidemia   . Osteoporosis 06/15/2017  . Poor compliance   . Vitamin D deficiency     Past Surgical History:  Procedure Laterality Date  . APPENDECTOMY    . BREAST BIOPSY Left 04/24/2017   malignant  . BREAST BIOPSY Left 04/24/2017   benign  . BREAST BIOPSY Left 04/25/2017   benign  . BREAST LUMPECTOMY Left 05/30/2017  . BREAST LUMPECTOMY WITH RADIOACTIVE SEED LOCALIZATION Left 05/30/2017   Procedure: LEFT BREAST LUMPECTOMY WITH RADIOACTIVE SEED LOCALIZATION;  Surgeon: Erroll Luna, MD;  Location: [facility];  Service: General;  Laterality: Left;  . DILATION AND CURETTAGE OF UTERUS    . FOREIGN BODY REMOVAL ESOPHAGEAL    . TONSILLECTOMY    . TUBAL LIGATION      Social History   Socioeconomic History  . Marital status:  Married    Spouse name: Not on file  . Number of children: Not on file  . Years of education: Not on file  . Highest education level: Not on file  Occupational History  . Not on file  Social Needs  . Financial resource strain: Not on file  . Food insecurity:    Worry: Not on file    Inability: Not on file  . Transportation needs:    Medical: Not on file    Non-medical: Not on file  Tobacco Use  . Smoking status: Never Smoker  . Smokeless tobacco: Never Used  Substance and Sexual Activity  . Alcohol use: No  . Drug use: No  . Sexual activity: Not on file  Lifestyle  . Physical activity:    Days per week: Not on file    Minutes per session: Not on file  . Stress: Not on file  Relationships  . Social connections:    Talks on phone: Not on file    Gets together: Not on file    Attends religious service: Not on file    Active member of club or organization: Not on file    Attends meetings of clubs or organizations: Not on file    Relationship status: Not on file  . Intimate partner violence:  Fear of current or ex partner: Not on file    Emotionally abused: Not on file    Physically abused: Not on file    Forced sexual activity: Not on file  Other Topics Concern  . Not on file  Social History Narrative  . Not on file    Current Outpatient Medications on File Prior to Visit  Medication Sig Dispense Refill  . aspirin 325 MG EC tablet Take 325 mg by mouth daily.    . Blood Glucose Calibration (TRUE METRIX LEVEL 1) Low SOLN 1 Bottle by In Vitro route as needed. 1 each 0  . Blood Glucose Monitoring Suppl (ACCU-CHEK AVIVA PLUS) w/Device KIT USE AS DIRECTED 2 TIMES DAILY 1 kit 0  . glucose blood test strip Used to check blood sugars twice daily. DX code E11.9. 100 each 12  . Lancet Devices (TRUEDRAW LANCING DEVICE) MISC 1 Device by Does not apply route as needed. 1 each 0  . losartan-hydrochlorothiazide (HYZAAR) 50-12.5 MG tablet Take 1 tablet by mouth daily. 90 tablet 1    . metFORMIN (GLUCOPHAGE) 1000 MG tablet TAKE 1 TABLET BY MOUTH TWICE A DAY WITH A MEAL 180 tablet 0  . metoprolol tartrate (LOPRESSOR) 25 MG tablet Take 1 tablet (25 mg total) by mouth 2 (two) times daily. 180 tablet 1  . OVER THE COUNTER MEDICATION Calcium 1200 with Vitamin D 1000- 1 tab daily    . OVER THE COUNTER MEDICATION Saffron Extract 88.50 mg - 1 daily    . pravastatin (PRAVACHOL) 20 MG tablet Take 1 tablet (20 mg total) by mouth daily. 90 tablet 1  . PROLIA 60 MG/ML SOLN injection   0  . sitaGLIPtin (JANUVIA) 100 MG tablet Take 1 tablet (100 mg total) by mouth daily. 30 tablet 11  . tamoxifen (NOLVADEX) 20 MG tablet Take 1 tablet (20 mg total) by mouth daily. 90 tablet 3  . vitamin C (ASCORBIC ACID) 500 MG tablet Take 500 mg by mouth daily.     No current facility-administered medications on file prior to visit.     Allergies  Allergen Reactions  . Atorvastatin Other (See Comments)    dizziness  . Fosamax [Alendronate Sodium]     Cough,trouble shallowing, chest pain  . Lisinopril Cough  . Tetracycline Nausea And Vomiting    Family History  Problem Relation Age of Onset  . Colon cancer Mother 39  . Ovarian cancer Mother 71       'radium treatment'  . Cardiomyopathy Mother   . Prostate cancer Maternal Uncle 56       metastatic 'died from it'  . Stomach cancer Maternal Grandmother 53  . Heart disease Maternal Grandmother   . Diabetes Maternal Grandfather   . Pancreatic cancer Maternal Aunt 66  . Prostate cancer Maternal Uncle 64       metastatic/ 'died from it'  . Cervical cancer Daughter   . Thyroid cancer Paternal Uncle   . Thyroid cancer Cousin     BP (!) 156/78 (BP Location: Left Arm, Patient Position: Sitting, Cuff Size: Normal)   Pulse 78   Ht '5\' 5"'  (1.651 m)   Wt 208 lb (94.3 kg)   SpO2 96%   BMI 34.61 kg/m    Review of Systems She denies hypoglycemia.     Objective:   Physical Exam VITAL SIGNS:  See vs page GENERAL: no distress Pulses:  dorsalis pedis intact bilat.   MSK: no deformity of the feet CV: trace bilat leg leg edema  Skin:  no ulcer on the feet.  normal color and temp on the feet. Neuro: sensation is intact to touch on the feet Ext: There is bilateral onychomycosis of the toenails   Lab Results  Component Value Date   HGBA1C 7.3 (A) 09/10/2017   Lab Results  Component Value Date   CREATININE 0.65 05/24/2017   BUN 10 05/24/2017   NA 140 05/24/2017   K 4.1 05/24/2017   CL 107 05/24/2017   CO2 20 (L) 05/24/2017       Assessment & Plan:  Type 2 DM: she needs increased rx, if it can be done with a regimen that avoids or minimizes hypoglycemia.   edema: this limits rx options  Patient Instructions  Your blood pressure is high today.  Please see your primary care provider soon, to have it rechecked. I have sent a prescription to your pharmacy, to change glipizide to "repaglinide" Please continue the same other diabetes medications check your blood sugar once a day.  vary the time of day when you check, between before the 3 meals, and at bedtime.  also check if you have symptoms of your blood sugar being too high or too low.  please keep a record of the readings and bring it to your next appointment here (or you can bring the meter itself).  You can write it on any piece of paper.  please call us sooner if your blood sugar goes below 70, or if you have a lot of readings over 200.   Please come back for a follow-up appointment in 3 months.

## 2017-09-17 ENCOUNTER — Other Ambulatory Visit: Payer: Self-pay

## 2017-09-17 MED ORDER — TRUEPLUS LANCETS 30G MISC: 1.0000 | Freq: Every day | 0 refills | Status: DC

## 2017-09-19 ENCOUNTER — Telehealth: Payer: Self-pay | Admitting: Endocrinology

## 2017-09-19 ENCOUNTER — Other Ambulatory Visit: Payer: Self-pay

## 2017-09-19 NOTE — Telephone Encounter (Signed)
I have called and clarified prescription with pharmacy.

## 2017-09-19 NOTE — Telephone Encounter (Signed)
[facility] needs approval for Lancets. Fax# [phone] (they faxed forms but no answer back) If questions please call patient

## 2017-10-09 ENCOUNTER — Other Ambulatory Visit: Payer: Self-pay | Admitting: Endocrinology

## 2017-10-09 DIAGNOSIS — I10 Essential (primary) hypertension: Secondary | ICD-10-CM

## 2017-10-15 ENCOUNTER — Ambulatory Visit: Payer: Medicare HMO | Admitting: Family Medicine

## 2017-10-17 ENCOUNTER — Other Ambulatory Visit: Payer: Self-pay | Admitting: Family Medicine

## 2017-10-17 DIAGNOSIS — IMO0001 Reserved for inherently not codable concepts without codable children: Secondary | ICD-10-CM

## 2017-10-17 DIAGNOSIS — E1165 Type 2 diabetes mellitus with hyperglycemia: Principal | ICD-10-CM

## 2017-10-17 NOTE — Progress Notes (Signed)
   Subjective:    Patient ID: [name], female    DOB: [birth year], 72 y.o.   MRN: [id number]  HPI Chief Complaint  Patient presents with  . close gaps    HTN- running 156/70, colonoscopy- due back in 2012 for this but breast cancer came up and she had to take care of that first. willing to go to [place] . diabetic eye exam done 03/03/17   She is here for a med check and to follow-up on chronic health conditions. HTN- medication at home has been in the 150s/70-75. Reports good medication compliance.   Colonoscopy-this is overdue.  Last one done at [facility].  She is requesting a referral to [facility]. History of diverticulosis and diverticulitis.  She has been going through treatment for breast cancer. Doing well.   Osteoporosis- she is on Prolia. No issues with this.  Last DEXA-  April 12 2017.   Vitamin D def- taking a supplement.   Dr. Loanne Drilling managing her diabetes now. Brought in her list of blood sugars. Lowest reading was 98 and her highest was 143.  She recently saw Dr. Gwenlyn Found, cardiologist, and no follow up needed.   Denies fever, chills, dizziness, chest pain, palpitations, shortness of breath, abdominal pain, N/V/D, urinary symptoms, LE edema.   Reviewed allergies, medications, past medical, surgical, family, and social history.   Review of Systems Pertinent positives and negatives in the history of present illness.     Objective:   Physical Exam BP (!) 144/86   Pulse 77   Wt 206 lb 3.2 oz (93.5 kg)   BMI 34.31 kg/m   Alert and in no distress.  Pharyngeal area is normal. Neck is supple without adenopathy or thyromegaly. Cardiac exam shows a regular sinus rhythm without murmurs or gallops. Lungs are clear to auscultation.      Assessment & Plan:  Essential hypertension - Plan: CBC with Differential/Platelet, Comprehensive metabolic panel  Controlled type 2 diabetes mellitus without complication, without long-term current use of insulin (HCC) - Plan: CBC  with Differential/Platelet, Comprehensive metabolic panel, Lipid panel  Age-related osteoporosis without current pathological fracture  Ductal carcinoma in situ (DCIS) of left breast  History of diverticulitis - Plan: Ambulatory referral to Gastroenterology  Screen for colon cancer - Plan: Ambulatory referral to Gastroenterology  Pure hypercholesterolemia - Plan: Lipid panel  Vitamin D deficiency - Plan: VITAMIN D 25 Hydroxy (Vit-D Deficiency, Fractures)  HTN- BP is not in goal range. Plan to increase Losartan from 50 mg to 100 mg and continue on HCTZ 12.5 mg. Counseling on diet and exercise.  Continue checking BP at home.  She will call me in 4 weeks with her BP readings.  Osteoporosis and doing fine on Prolia.  Diabetes well controlled now and being managed by Dr. Loanne Drilling, endocrinologist.  Referral to [facility] for screening colonoscopy. Last one was done in 2007 at [place]. Report is in her EMR.  Vitamin D deficiency and on a supplement.  Lipid check.  Follow up pending labs.

## 2017-10-18 ENCOUNTER — Encounter: Payer: Self-pay | Admitting: Family Medicine

## 2017-10-18 ENCOUNTER — Ambulatory Visit: Payer: Medicare HMO | Admitting: Family Medicine

## 2017-10-18 VITALS — BP 144/86 | HR 77 | Wt 206.2 lb

## 2017-10-18 DIAGNOSIS — D0512 Intraductal carcinoma in situ of left breast: Secondary | ICD-10-CM

## 2017-10-18 DIAGNOSIS — I10 Essential (primary) hypertension: Secondary | ICD-10-CM

## 2017-10-18 DIAGNOSIS — Z1211 Encounter for screening for malignant neoplasm of colon: Secondary | ICD-10-CM

## 2017-10-18 DIAGNOSIS — Z8719 Personal history of other diseases of the digestive system: Secondary | ICD-10-CM

## 2017-10-18 DIAGNOSIS — M810 Age-related osteoporosis without current pathological fracture: Secondary | ICD-10-CM

## 2017-10-18 DIAGNOSIS — E7800 Pure hypercholesterolemia, unspecified: Secondary | ICD-10-CM | POA: Diagnosis not present

## 2017-10-18 DIAGNOSIS — E559 Vitamin D deficiency, unspecified: Secondary | ICD-10-CM | POA: Diagnosis not present

## 2017-10-18 DIAGNOSIS — E119 Type 2 diabetes mellitus without complications: Secondary | ICD-10-CM | POA: Diagnosis not present

## 2017-10-18 MED ORDER — LOSARTAN POTASSIUM-HCTZ 100-12.5 MG PO TABS
1.0000 | ORAL_TABLET | Freq: Every day | ORAL | 1 refills | Status: DC
Start: 2017-10-18 — End: 2018-04-24

## 2017-10-18 NOTE — Patient Instructions (Addendum)
Your BP today is still not quite to goal range. It is 144/86. Goal BP is <130/80.   I am increasing your losartan-HCTZ medication. This will be sent to your pharmacy.   CALL ME IN 4 WEEKS TO LET ME KNOW WHAT YOUR BLOOD PRESSURE READINGS HAVE BEEN OR SEND THROUGH MY CHART   Cut back on salt. Increase your physical activity. I think the program at the [facility] sounds great.   Continue seeing Dr. Loanne Drilling for your diabetes.    DASH Eating Plan DASH stands for "Dietary Approaches to Stop Hypertension." The DASH eating plan is a healthy eating plan that has been shown to reduce high blood pressure (hypertension). It may also reduce your risk for type 2 diabetes, heart disease, and stroke. The DASH eating plan may also help with weight loss. What are tips for following this plan? General guidelines  Avoid eating more than 2,300 mg (milligrams) of salt (sodium) a day. If you have hypertension, you may need to reduce your sodium intake to 1,500 mg a day.  Limit alcohol intake to no more than 1 drink a day for nonpregnant women and 2 drinks a day for men. One drink equals 12 oz of beer, 5 oz of wine, or 1 oz of hard liquor.  Work with your health care provider to maintain a healthy body weight or to lose weight. Ask what an ideal weight is for you.  Get at least 30 minutes of exercise that causes your heart to beat faster (aerobic exercise) most days of the week. Activities may include walking, swimming, or biking.  Work with your health care provider or diet and nutrition specialist (dietitian) to adjust your eating plan to your individual calorie needs. Reading food labels  Check food labels for the amount of sodium per serving. Choose foods with less than 5 percent of the Daily Value of sodium. Generally, foods with less than 300 mg of sodium per serving fit into this eating plan.  To find whole grains, look for the word "whole" as the first word in the ingredient list. Shopping  Buy products  labeled as "low-sodium" or "no salt added."  Buy fresh foods. Avoid canned foods and premade or frozen meals. Cooking  Avoid adding salt when cooking. Use salt-free seasonings or herbs instead of table salt or sea salt. Check with your health care provider or pharmacist before using salt substitutes.  Do not fry foods. Cook foods using healthy methods such as baking, boiling, grilling, and broiling instead.  Cook with heart-healthy oils, such as olive, canola, soybean, or sunflower oil. Meal planning   Eat a balanced diet that includes: ? 5 or more servings of fruits and vegetables each day. At each meal, try to fill half of your plate with fruits and vegetables. ? Up to 6-8 servings of whole grains each day. ? Less than 6 oz of lean meat, poultry, or fish each day. A 3-oz serving of meat is about the same size as a deck of cards. One egg equals 1 oz. ? 2 servings of low-fat dairy each day. ? A serving of nuts, seeds, or beans 5 times each week. ? Heart-healthy fats. Healthy fats called Omega-3 fatty acids are found in foods such as flaxseeds and coldwater fish, like sardines, salmon, and mackerel.  Limit how much you eat of the following: ? Canned or prepackaged foods. ? Food that is high in trans fat, such as fried foods. ? Food that is high in saturated fat, such as  fatty meat. ? Sweets, desserts, sugary drinks, and other foods with added sugar. ? Full-fat dairy products.  Do not salt foods before eating.  Try to eat at least 2 vegetarian meals each week.  Eat more home-cooked food and less restaurant, buffet, and fast food.  When eating at a restaurant, ask that your food be prepared with less salt or no salt, if possible. What foods are recommended? The items listed may not be a complete list. Talk with your dietitian about what dietary choices are best for you. Grains Whole-grain or whole-wheat bread. Whole-grain or whole-wheat pasta. Brown rice. [place] Morrow. Bulgur.  Whole-grain and low-sodium cereals. Pita bread. Low-fat, low-sodium crackers. Whole-wheat flour tortillas. Vegetables Fresh or frozen vegetables (raw, steamed, roasted, or grilled). Low-sodium or reduced-sodium tomato and vegetable juice. Low-sodium or reduced-sodium tomato sauce and tomato paste. Low-sodium or reduced-sodium canned vegetables. Fruits All fresh, dried, or frozen fruit. Canned fruit in natural juice (without added sugar). Meat and other protein foods Skinless chicken or [place]. Ground chicken or [place]. Pork with fat trimmed off. Fish and seafood. Egg whites. Dried beans, peas, or lentils. Unsalted nuts, nut butters, and seeds. Unsalted canned beans. Lean cuts of beef with fat trimmed off. Low-sodium, lean deli meat. Dairy Low-fat (1%) or fat-free (skim) milk. Fat-free, low-fat, or reduced-fat cheeses. Nonfat, low-sodium ricotta or cottage cheese. Low-fat or nonfat yogurt. Low-fat, low-sodium cheese. Fats and oils Soft margarine without trans fats. Vegetable oil. Low-fat, reduced-fat, or light mayonnaise and salad dressings (reduced-sodium). Canola, safflower, olive, soybean, and sunflower oils. Avocado. Seasoning and other foods Herbs. Spices. Seasoning mixes without salt. Unsalted popcorn and pretzels. Fat-free sweets. What foods are not recommended? The items listed may not be a complete list. Talk with your dietitian about what dietary choices are best for you. Grains Baked goods made with fat, such as croissants, muffins, or some breads. Dry pasta or rice meal packs. Vegetables Creamed or fried vegetables. Vegetables in a cheese sauce. Regular canned vegetables (not low-sodium or reduced-sodium). Regular canned tomato sauce and paste (not low-sodium or reduced-sodium). Regular tomato and vegetable juice (not low-sodium or reduced-sodium). Angie Fava. Olives. Fruits Canned fruit in a light or heavy syrup. Fried fruit. Fruit in cream or butter sauce. Meat and other protein  foods Fatty cuts of meat. Ribs. Fried meat. Berniece Salines. Sausage. Bologna and other processed lunch meats. Salami. Fatback. Hotdogs. Bratwurst. Salted nuts and seeds. Canned beans with added salt. Canned or smoked fish. Whole eggs or egg yolks. Chicken or [place] with skin. Dairy Whole or 2% milk, cream, and half-and-half. Whole or full-fat cream cheese. Whole-fat or sweetened yogurt. Full-fat cheese. Nondairy creamers. Whipped toppings. Processed cheese and cheese spreads. Fats and oils Butter. Stick margarine. Lard. Shortening. Ghee. Bacon fat. Tropical oils, such as coconut, palm kernel, or palm oil. Seasoning and other foods Salted popcorn and pretzels. Onion salt, garlic salt, seasoned salt, table salt, and sea salt. Worcestershire sauce. Tartar sauce. Barbecue sauce. Teriyaki sauce. Soy sauce, including reduced-sodium. Steak sauce. Canned and packaged gravies. Fish sauce. Oyster sauce. Cocktail sauce. Horseradish that you find on the shelf. Ketchup. Mustard. Meat flavorings and tenderizers. Bouillon cubes. Hot sauce and Tabasco sauce. Premade or packaged marinades. Premade or packaged taco seasonings. Relishes. Regular salad dressings. Where to find more information:  National Heart, Lung, and [place]: https://wilson-eaton.com/  American Heart Association: www.heart.org Summary  The DASH eating plan is a healthy eating plan that has been shown to reduce high blood pressure (hypertension). It may also reduce your risk for  type 2 diabetes, heart disease, and stroke.  With the DASH eating plan, you should limit salt (sodium) intake to 2,300 mg a day. If you have hypertension, you may need to reduce your sodium intake to 1,500 mg a day.  When on the DASH eating plan, aim to eat more fresh fruits and vegetables, whole grains, lean proteins, low-fat dairy, and heart-healthy fats.  Work with your health care provider or diet and nutrition specialist (dietitian) to adjust your eating plan to your  individual calorie needs. This information is not intended to replace advice given to you by your health care provider. Make sure you discuss any questions you have with your health care provider. Document Released: 03/16/2011 Document Revised: 03/20/2016 Document Reviewed: 03/20/2016 Elsevier Interactive Patient Education  [employer].

## 2017-10-19 LAB — COMPREHENSIVE METABOLIC PANEL
ALBUMIN: 4.1 g/dL (ref 3.5–4.8)
ALT: 25 IU/L (ref 0–32)
AST: 30 IU/L (ref 0–40)
Albumin/Globulin Ratio: 1.6 (ref 1.2–2.2)
Alkaline Phosphatase: 42 IU/L (ref 39–117)
BUN/Creatinine Ratio: 21 (ref 12–28)
BUN: 16 mg/dL (ref 8–27)
Bilirubin Total: 0.3 mg/dL (ref 0.0–1.2)
CO2: 20 mmol/L (ref 20–29)
CREATININE: 0.78 mg/dL (ref 0.57–1.00)
Calcium: 9.5 mg/dL (ref 8.7–10.3)
Chloride: 102 mmol/L (ref 96–106)
GFR, EST AFRICAN AMERICAN: 89 mL/min/{1.73_m2} (ref >59)
GFR, EST NON AFRICAN AMERICAN: 77 mL/min/{1.73_m2} (ref >59)
GLOBULIN, TOTAL: 2.5 g/dL (ref 1.5–4.5)
GLUCOSE: 96 mg/dL (ref 65–99)
Potassium: 4.6 mmol/L (ref 3.5–5.2)
SODIUM: 140 mmol/L (ref 134–144)
TOTAL PROTEIN: 6.6 g/dL (ref 6.0–8.5)

## 2017-10-19 LAB — CBC WITH DIFFERENTIAL/PLATELET
Basophils Absolute: 0.1 10*3/uL (ref 0.0–0.2)
Basos: 1 %
EOS (ABSOLUTE): 0.5 10*3/uL — ABNORMAL HIGH (ref 0.0–0.4)
EOS: 5 %
HEMATOCRIT: 42.4 % (ref 34.0–46.6)
HEMOGLOBIN: 12.9 g/dL (ref 11.1–15.9)
IMMATURE GRANS (ABS): 0 10*3/uL (ref 0.0–0.1)
Immature Granulocytes: 0 %
LYMPHS: 16 %
Lymphocytes Absolute: 1.6 10*3/uL (ref 0.7–3.1)
MCH: 26.7 pg (ref 26.6–33.0)
MCHC: 30.4 g/dL — AB (ref 31.5–35.7)
MCV: 88 fL (ref 79–97)
MONOCYTES: 11 %
Monocytes Absolute: 1.1 10*3/uL — ABNORMAL HIGH (ref 0.1–0.9)
NEUTROS ABS: 6.9 10*3/uL (ref 1.4–7.0)
Neutrophils: 67 %
Platelets: 298 10*3/uL (ref 150–450)
RBC: 4.84 x10E6/uL (ref 3.77–5.28)
RDW: 16.2 % — ABNORMAL HIGH (ref 12.3–15.4)
WBC: 10.2 10*3/uL (ref 3.4–10.8)

## 2017-10-19 LAB — LIPID PANEL
CHOL/HDL RATIO: 4.6 ratio — AB (ref 0.0–4.4)
Cholesterol, Total: 167 mg/dL (ref 100–199)
HDL: 36 mg/dL — AB (ref >39)
LDL Calculated: 81 mg/dL (ref 0–99)
Triglycerides: 249 mg/dL — ABNORMAL HIGH (ref 0–149)
VLDL Cholesterol Cal: 50 mg/dL — ABNORMAL HIGH (ref 5–40)

## 2017-10-19 LAB — VITAMIN D 25 HYDROXY (VIT D DEFICIENCY, FRACTURES): VIT D 25 HYDROXY: 25.3 ng/mL — AB (ref 30.0–100.0)

## 2017-10-24 ENCOUNTER — Telehealth: Payer: Self-pay

## 2017-10-24 NOTE — Telephone Encounter (Signed)
Spoke with pt reminding of SCP visit with NP on 11/06/17 at 2 pm.  Pt said she will come to appt.

## 2017-10-25 ENCOUNTER — Encounter: Payer: Self-pay | Admitting: Internal Medicine

## 2017-10-26 ENCOUNTER — Encounter: Payer: Self-pay | Admitting: Gastroenterology

## 2017-11-06 ENCOUNTER — Encounter: Payer: Self-pay | Admitting: Adult Health

## 2017-11-06 ENCOUNTER — Telehealth: Payer: Self-pay | Admitting: Hematology and Oncology

## 2017-11-06 ENCOUNTER — Inpatient Hospital Stay: Payer: Medicare HMO | Attending: Adult Health | Admitting: Adult Health

## 2017-11-06 VITALS — BP 154/76 | HR 74 | Temp 98.1°F | Resp 18 | Ht 65.0 in | Wt 213.5 lb

## 2017-11-06 DIAGNOSIS — Z8041 Family history of malignant neoplasm of ovary: Secondary | ICD-10-CM | POA: Insufficient documentation

## 2017-11-06 DIAGNOSIS — Z170 Estrogen receptor positive status [ER+]: Secondary | ICD-10-CM | POA: Insufficient documentation

## 2017-11-06 DIAGNOSIS — Z808 Family history of malignant neoplasm of other organs or systems: Secondary | ICD-10-CM | POA: Diagnosis not present

## 2017-11-06 DIAGNOSIS — Z800 Family history of malignant neoplasm of digestive organs: Secondary | ICD-10-CM | POA: Diagnosis not present

## 2017-11-06 DIAGNOSIS — E119 Type 2 diabetes mellitus without complications: Secondary | ICD-10-CM | POA: Insufficient documentation

## 2017-11-06 DIAGNOSIS — Z79810 Long term (current) use of selective estrogen receptor modulators (SERMs): Secondary | ICD-10-CM | POA: Diagnosis not present

## 2017-11-06 DIAGNOSIS — Z8049 Family history of malignant neoplasm of other genital organs: Secondary | ICD-10-CM | POA: Diagnosis not present

## 2017-11-06 DIAGNOSIS — Z923 Personal history of irradiation: Secondary | ICD-10-CM | POA: Insufficient documentation

## 2017-11-06 DIAGNOSIS — D0512 Intraductal carcinoma in situ of left breast: Secondary | ICD-10-CM | POA: Insufficient documentation

## 2017-11-06 NOTE — Telephone Encounter (Signed)
Per 7/30 los.  Gave patient avs and calendar.

## 2017-11-06 NOTE — Progress Notes (Signed)
CLINIC:  Survivorship   REASON FOR VISIT:  Routine follow-up post-treatment for a recent history of breast cancer.  BRIEF ONCOLOGIC HISTORY:    Ductal carcinoma in situ (DCIS) of left breast   04/24/2017 Initial Diagnosis    Screening detected left breast calcifications in 3 areas UOQ, anterior group of calcifications 1.4 cm biopsy revealed high-grade DCIS with calcifications ER 95%, PR 95%, other 2 areas benign fibrocystic changes, Tis N0 stage 0      05/30/2017 Surgery    Left Lumpectomy: DCIS Intermediate Grade with necrosis and calcs, 0.9 cm, Er: 95%, PR 95%, TisNx (Stage 0)      07/06/2017 Genetic Testing    The Common Hereditary Cancer Panel + Thyroid cancer Panel was ordered.  The following genes were evaluated for sequence changes and exonic deletions/duplications: APC, ATM, AXIN2, BARD1, BMPR1A, BRCA1, BRCA2, BRIP1, CDH1, CDK4, CDKN2A (p14ARF), CDKN2A (p16INK4a), CHEK2, CTNNA1, DICER1, EPCAM*, GREM1*, KIT, MEN1, MLH1, MSH2, MSH3, MSH6, MUTYH, NBN, NF1, PALB2, PDGFRA, PMS2, POLD1, POLE, PRKAR1A, PTEN, RAD50, RAD51C, RAD51D, RET, SDHB, SDHC, SDHD, SMAD4, SMARCA4, STK11, TP53, TSC1, TSC2, VHL.  The following genes were evaluated for sequence changes only: HOXB13*, NTHL1*, SDHA  Results: Negative, no pathogenic variants identified.  The date of this test report is 07/06/2017.      07/10/2017 - 08/06/2017 Radiation Therapy    Adjuvant radiation therapy      08/07/2017 -  Anti-estrogen oral therapy    Adjuvant antiestrogen therapy with tamoxifen 20 mg daily x5 years       INTERVAL HISTORY:  Ms. Scherzinger presents to the [facility] today for our initial meeting to review her survivorship care plan detailing her treatment course for breast cancer, as well as monitoring long-term side effects of that treatment, education regarding health maintenance, screening, and overall wellness and health promotion.     Overall, Ms. Joy reports feeling quite well.  She is taking  Tamoxifen daily.  She has an occasional hot flash, however these are tolerable.  She denies any vaginal discharge, vaginal bleeding, arthralgias, or any other concerns.    She is beginning to recover her energy level and notes that she is going to start walking.    REVIEW OF SYSTEMS:  Review of Systems  Constitutional: Positive for fatigue. Negative for appetite change, chills and fever.  HENT:   Negative for hearing loss, lump/mass and trouble swallowing.   Eyes: Negative for eye problems and icterus.  Respiratory: Negative for chest tightness, cough and shortness of breath.   Cardiovascular: Negative for chest pain, leg swelling and palpitations.  Gastrointestinal: Negative for abdominal distention, abdominal pain, constipation, diarrhea, nausea and vomiting.  Endocrine: Positive for hot flashes.  Musculoskeletal: Negative for arthralgias.  Skin: Negative for itching and rash.  Neurological: Negative for dizziness, extremity weakness, headaches and numbness.  Hematological: Negative for adenopathy. Does not bruise/bleed easily.  Psychiatric/Behavioral: Negative for depression. The patient is not nervous/anxious.   Breast: Denies any new nodularity, masses, tenderness, nipple changes, or nipple discharge.      ONCOLOGY TREATMENT TEAM:  1. Surgeon:  Dr. Brantley Stage at [facility] 2. Medical Oncologist: Dr. Lindi Adie  3. Radiation Oncologist: Dr. Isidore Moos    PAST MEDICAL/SURGICAL HISTORY:  Past Medical History:  Diagnosis Date  . Abnormal bone density screening   . Arthritis   . Complication of anesthesia    "i wake up"  . Diverticulosis   . DM (diabetes mellitus) ([place])   . Family history of colon cancer   . Family  history of ovarian cancer   . Family history of pancreatic cancer   . Family history of prostate cancer   . Family history of stomach cancer   . GERD (gastroesophageal reflux disease)   . History of radiation therapy 07/10/17- 08/06/17   40.05 Gy directed to  the left breast in 15 fractions, followed by a boost of 10 Gy in 5 fractions.   Marland Kitchen HTN (hypertension)   . Hyperlipidemia   . Osteoporosis 06/15/2017  . Poor compliance   . Vitamin D deficiency    Past Surgical History:  Procedure Laterality Date  . APPENDECTOMY    . BREAST BIOPSY Left 04/24/2017   malignant  . BREAST BIOPSY Left 04/24/2017   benign  . BREAST BIOPSY Left 04/25/2017   benign  . BREAST LUMPECTOMY Left 05/30/2017  . BREAST LUMPECTOMY WITH RADIOACTIVE SEED LOCALIZATION Left 05/30/2017   Procedure: LEFT BREAST LUMPECTOMY WITH RADIOACTIVE SEED LOCALIZATION;  Surgeon: Erroll Luna, MD;  Location: [place];  Service: General;  Laterality: Left;  . DILATION AND CURETTAGE OF UTERUS    . FOREIGN BODY REMOVAL ESOPHAGEAL    . TONSILLECTOMY    . TUBAL LIGATION       ALLERGIES:  Allergies  Allergen Reactions  . Atorvastatin Other (See Comments)    dizziness  . Fosamax [Alendronate Sodium]     Cough,trouble shallowing, chest pain  . Lisinopril Cough  . Tetracycline Nausea And Vomiting     CURRENT MEDICATIONS:  Outpatient Encounter Medications as of 11/06/2017  Medication Sig  . aspirin 325 MG EC tablet Take 325 mg by mouth daily.  . Blood Glucose Calibration (TRUE METRIX LEVEL 1) Low SOLN 1 Bottle by In Vitro route as needed.  . Blood Glucose Monitoring Suppl (ACCU-CHEK AVIVA PLUS) w/Device KIT USE AS DIRECTED 2 TIMES DAILY  . glucose blood test strip Used to check blood sugars twice daily. DX code E11.9.  . Lancet Devices (TRUEDRAW LANCING DEVICE) MISC 1 Device by Does not apply route as needed.  Marland Kitchen losartan-hydrochlorothiazide (HYZAAR) 100-12.5 MG tablet Take 1 tablet by mouth daily.  . metFORMIN (GLUCOPHAGE) 1000 MG tablet TAKE 1 TABLET BY MOUTH TWICE A DAY WITH A MEAL  . metoprolol tartrate (LOPRESSOR) 25 MG tablet Take 1 tablet (25 mg total) by mouth 2 (two) times daily.  Marland Kitchen OVER THE COUNTER MEDICATION Calcium 1200 with Vitamin D 1000- 1 tab daily  . OVER THE COUNTER  MEDICATION Saffron Extract 88.50 mg - 1 daily  . pravastatin (PRAVACHOL) 20 MG tablet Take 1 tablet (20 mg total) by mouth daily.  Marland Kitchen PROLIA 60 MG/ML SOLN injection   . repaglinide (PRANDIN) 1 MG tablet Take 1 tablet (1 mg total) by mouth 2 (two) times daily before a meal.  . sitaGLIPtin (JANUVIA) 100 MG tablet Take 1 tablet (100 mg total) by mouth daily.  . tamoxifen (NOLVADEX) 20 MG tablet Take 1 tablet (20 mg total) by mouth daily.  . TRUEPLUS LANCETS 30G MISC 1 Package by Does not apply route daily. use to check blood sugar daily  . vitamin C (ASCORBIC ACID) 500 MG tablet Take 500 mg by mouth daily.   No facility-administered encounter medications on file as of 11/06/2017.      ONCOLOGIC FAMILY HISTORY:  Family History  Problem Relation Age of Onset  . Colon cancer Mother 61  . Ovarian cancer Mother 43       'radium treatment'  . Cardiomyopathy Mother   . Prostate cancer Maternal Uncle 55  metastatic 'died from it'  . Stomach cancer Maternal Grandmother 82  . Heart disease Maternal Grandmother   . Diabetes Maternal Grandfather   . Pancreatic cancer Maternal Aunt 66  . Prostate cancer Maternal Uncle 38       metastatic/ 'died from it'  . Cervical cancer Daughter   . Thyroid cancer Paternal Uncle   . Thyroid cancer Cousin      GENETIC COUNSELING/TESTING: See above  SOCIAL HISTORY:  Social History   Socioeconomic History  . Marital status: Married    Spouse name: Not on file  . Number of children: Not on file  . Years of education: Not on file  . Highest education level: Not on file  Occupational History  . Not on file  Social Needs  . Financial resource strain: Not on file  . Food insecurity:    Worry: Not on file    Inability: Not on file  . Transportation needs:    Medical: Not on file    Non-medical: Not on file  Tobacco Use  . Smoking status: Never Smoker  . Smokeless tobacco: Never Used  Substance and Sexual Activity  . Alcohol use: No  . Drug  use: No  . Sexual activity: Not on file  Lifestyle  . Physical activity:    Days per week: Not on file    Minutes per session: Not on file  . Stress: Not on file  Relationships  . Social connections:    Talks on phone: Not on file    Gets together: Not on file    Attends religious service: Not on file    Active member of club or organization: Not on file    Attends meetings of clubs or organizations: Not on file    Relationship status: Not on file  . Intimate partner violence:    Fear of current or ex partner: Not on file    Emotionally abused: Not on file    Physically abused: Not on file    Forced sexual activity: Not on file  Other Topics Concern  . Not on file  Social History Narrative  . Not on file      PHYSICAL EXAMINATION:  Vital Signs:   Vitals:   11/06/17 1344  BP: (!) 154/76  Pulse: 74  Resp: 18  Temp: 98.1 F (36.7 C)  SpO2: 97%   Filed Weights   11/06/17 1344  Weight: 213 lb 8 oz (96.8 kg)   General: Well-nourished, well-appearing female in no acute distress.  She is unaccompanied today.   HEENT: Head is normocephalic.  Pupils equal and reactive to light. Conjunctivae clear without exudate.  Sclerae anicteric. Oral mucosa is pink, moist.  Oropharynx is pink without lesions or erythema.  Lymph: No cervical, supraclavicular, or infraclavicular lymphadenopathy noted on palpation.  Cardiovascular: Regular rate and rhythm.Marland Kitchen Respiratory: Clear to auscultation bilaterally. Chest expansion symmetric; breathing non-labored.  Breasts: left breast s/p lumpectomy and radiation, no nodules, no masses noted, right breast without nodules, masses, skin or nipple changes GI: Abdomen soft and round; non-tender, non-distended. Bowel sounds normoactive.  GU: Deferred.  Neuro: No focal deficits. Steady gait.  Psych: Mood and affect normal and appropriate for situation.  Extremities: No edema. MSK: No focal spinal tenderness to palpation.  Full range of motion in  bilateral upper extremities Skin: Warm and dry.  LABORATORY DATA:  None for this visit.  DIAGNOSTIC IMAGING:  None for this visit.      ASSESSMENT AND PLAN:  Ms.. Messner  is a pleasant 72 y.o. female with Stage 0 left breast DCIS, ER+/PR+, diagnosed in 04/2017, treated with lumpectomy, adjuvant radiation therapy, and anti-estrogen therapy with Tamoxifen beginning in 07/2017.  She presents to the Survivorship Clinic for our initial meeting and routine follow-up post-completion of treatment for breast cancer.    1. Stage 0 left breast cancer:  Ms. Lauricella is continuing to recover from definitive treatment for breast cancer. She will follow-up with her medical oncologist, Dr. Lindi Adie in 6 monthswith history and physical exam per surveillance protocol.  She will continue her anti-estrogen therapy with Tamoxifen . Thus far, she is tolerating the Tamoxifen well, with minimal side effects. She was instructed to make Dr. Lindi Adie or myself aware if she begins to experience any worsening side effects of the medication and I could see her back in clinic to help manage those side effects, as needed. Today, a comprehensive survivorship care plan and treatment summary was reviewed with the patient today detailing her breast cancer diagnosis, treatment course, potential late/long-term effects of treatment, appropriate follow-up care with recommendations for the future, and patient education resources.  A copy of this summary, along with a letter will be sent to the patient's primary care provider via mail/fax/In Basket message after today's visit.    2. Bone health:  Given Ms. Whichard's age/history of breast cancer, she is at risk for bone demineralization.  Her last DEXA scan was in 04/2017 and was consistent with osteoporosis.  She receives prolia for this.  I counseled her that Tamoxifen has a protective effect on the bones as well.  She was given education on specific activities to promote bone health.  3. Cancer  screening:  Due to Ms. Taft's history and her age, she should receive screening for skin cancers, colon cancer, and gynecologic cancers.  The information and recommendations are listed on the patient's comprehensive care plan/treatment summary and were reviewed in detail with the patient.    4. Health maintenance and wellness promotion: Ms. Westra was encouraged to consume 5-7 servings of fruits and vegetables per day. We reviewed the "Nutrition Rainbow" handout, as well as the handout "Take Control of Your Health and Reduce Your Cancer Risk" from the [employer].  She was also encouraged to engage in moderate to vigorous exercise for 30 minutes per day most days of the week. We discussed the [employer] fitness program, which is designed for cancer survivors to help them become more physically fit after cancer treatments.  She was instructed to limit her alcohol consumption and continue to abstain from tobacco use.     #. Support services/counseling: It is not uncommon for this period of the patient's cancer care trajectory to be one of many emotions and stressors.  We discussed an opportunity for her to participate in the next session of [facility] ("Finding Your New Normal") support group series designed for patients after they have completed treatment.   Ms. Liddell was encouraged to take advantage of our many other support services programs, support groups, and/or counseling in coping with her new life as a cancer survivor after completing anti-cancer treatment. She was given information regarding our available services and encouraged to contact me with any questions or for help enrolling in any of our support group/programs.    Dispo:   -Return to cancer center in 6 months for f/u with Dr. Lindi Adie  -Mammogram due in 04/2018 -Follow up with Dr. Brantley Stage in 1 year. -She is welcome to return back to the Survivorship Clinic  at any time; no additional follow-up needed at this time.  -Consider  referral back to survivorship as a long-term survivor for continued surveillance  A total of (30) minutes of face-to-face time was spent with this patient with greater than 50% of that time in counseling and care-coordination.   Gardenia Phlegm, [place] [phone]   Note: PRIMARY CARE PROVIDER Girtha Rm, [place] [phone]

## 2017-12-11 ENCOUNTER — Ambulatory Visit: Payer: Medicare HMO | Admitting: Endocrinology

## 2017-12-12 ENCOUNTER — Telehealth: Payer: Self-pay | Admitting: *Deleted

## 2017-12-12 NOTE — Telephone Encounter (Signed)
Dr Lyndel Safe,  This pt is scheduled for a screening colon with you on 01-04-18, Friday.  She has a hx of breast cancer- she finished radiation 08-06-2017- she did not have chemo- she is on tamoxifen daily- since it has not been a yr post radiation, protocol is we ask if she's ok to proceed with her procedure as scheduled.   Please advise,  Thanks  Lelan Pons

## 2017-12-13 NOTE — Telephone Encounter (Signed)
OK to proceed

## 2017-12-20 ENCOUNTER — Encounter: Payer: Self-pay | Admitting: Family Medicine

## 2017-12-21 ENCOUNTER — Ambulatory Visit (AMBULATORY_SURGERY_CENTER): Payer: Self-pay | Admitting: *Deleted

## 2017-12-21 ENCOUNTER — Encounter: Payer: Self-pay | Admitting: Gastroenterology

## 2017-12-21 VITALS — Ht 65.0 in | Wt 216.0 lb

## 2017-12-21 DIAGNOSIS — Z800 Family history of malignant neoplasm of digestive organs: Secondary | ICD-10-CM

## 2017-12-21 DIAGNOSIS — Z1211 Encounter for screening for malignant neoplasm of colon: Secondary | ICD-10-CM

## 2017-12-21 MED ORDER — NA SULFATE-K SULFATE-MG SULF 17.5-3.13-1.6 GM/177ML PO SOLN: ORAL | 0 refills | Status: DC

## 2017-12-21 NOTE — Progress Notes (Signed)
Patient denies any allergies to eggs or soy. Patient denies any problems with anesthesia/sedation. Patient denies any oxygen use at home. Patient denies taking any diet/weight loss medications or blood thinners. EMMI education offered, pt declined.  

## 2017-12-25 ENCOUNTER — Ambulatory Visit (INDEPENDENT_AMBULATORY_CARE_PROVIDER_SITE_OTHER): Payer: Medicare HMO | Admitting: Endocrinology

## 2017-12-25 ENCOUNTER — Encounter: Payer: Self-pay | Admitting: Endocrinology

## 2017-12-25 VITALS — BP 130/80 | HR 85 | Ht 65.0 in | Wt 212.8 lb

## 2017-12-25 DIAGNOSIS — E119 Type 2 diabetes mellitus without complications: Secondary | ICD-10-CM

## 2017-12-25 LAB — POCT GLYCOSYLATED HEMOGLOBIN (HGB A1C): Hemoglobin A1C: 7.4 % — AB (ref 4.0–5.6)

## 2017-12-25 NOTE — Patient Instructions (Addendum)
Please continue the same diabetes medications.   check your blood sugar once a day.  vary the time of day when you check, between before the 3 meals, and at bedtime.  also check if you have symptoms of your blood sugar being too high or too low.  please keep a record of the readings and bring it to your next appointment here (or you can bring the meter itself).  You can write it on any piece of paper.  please call us sooner if your blood sugar goes below 70, or if you have a lot of readings over 200.   Please come back for a follow-up appointment in 4 months.   

## 2017-12-25 NOTE — Progress Notes (Signed)
Subjective:    Patient ID: [name], female    DOB: 04-Dec-1945, 72 y.o.   MRN: [id number]  HPI Pt returns for f/u of diabetes mellitus: DM type: 2 Dx'ed: 2423 Complications: none Therapy: 2 oral meds GDM: 1985 DKA: never Severe hypoglycemia: never Pancreatitis: never Pancreatic imaging: never Other: she has never been on insulin; she cannot afford brand name meds Interval history: pt states she feels well in general.  she brings a record of her cbg's which I have reviewed today.  It varies from 118-150.  There is no trend throughout the day.  She did not take [place], due to cost.   Past Medical History:  Diagnosis Date  . Abnormal bone density screening   . Arthritis   . Cancer ([place]) 07/2017   left breast cancer  . Complication of anesthesia    "i wake up"  . Diverticulosis   . DM (diabetes mellitus) ([place])   . Family history of colon cancer   . Family history of ovarian cancer   . Family history of pancreatic cancer   . Family history of prostate cancer   . Family history of stomach cancer   . GERD (gastroesophageal reflux disease)   . History of radiation therapy 07/10/17- 08/06/17   40.05 Gy directed to the left breast in 15 fractions, followed by a boost of 10 Gy in 5 fractions.   Marland Kitchen HTN (hypertension)   . Hyperlipidemia   . Osteoporosis 06/15/2017  . Poor compliance   . Vitamin D deficiency     Past Surgical History:  Procedure Laterality Date  . APPENDECTOMY    . BREAST BIOPSY Left 04/24/2017   malignant  . BREAST BIOPSY Left 04/24/2017   benign  . BREAST BIOPSY Left 04/25/2017   benign  . BREAST LUMPECTOMY Left 05/30/2017  . BREAST LUMPECTOMY WITH RADIOACTIVE SEED LOCALIZATION Left 05/30/2017   Procedure: LEFT BREAST LUMPECTOMY WITH RADIOACTIVE SEED LOCALIZATION;  Surgeon: Erroll Luna, MD;  Location: [place];  Service: General;  Laterality: Left;  . COLONOSCOPY  06/12/2005   Schooler/normal exam="i woke up during it"  . DILATION AND CURETTAGE OF  UTERUS    . FOREIGN BODY REMOVAL ESOPHAGEAL    . TONSILLECTOMY    . TUBAL LIGATION      Social History   Socioeconomic History  . Marital status: Married    Spouse name: Not on file  . Number of children: Not on file  . Years of education: Not on file  . Highest education level: Not on file  Occupational History  . Not on file  Social Needs  . Financial resource strain: Not on file  . Food insecurity:    Worry: Not on file    Inability: Not on file  . Transportation needs:    Medical: Not on file    Non-medical: Not on file  Tobacco Use  . Smoking status: Never Smoker  . Smokeless tobacco: Never Used  Substance and Sexual Activity  . Alcohol use: No  . Drug use: No  . Sexual activity: Not on file  Lifestyle  . Physical activity:    Days per week: Not on file    Minutes per session: Not on file  . Stress: Not on file  Relationships  . Social connections:    Talks on phone: Not on file    Gets together: Not on file    Attends religious service: Not on file    Active member of club or organization: Not on  file    Attends meetings of clubs or organizations: Not on file    Relationship status: Not on file  . Intimate partner violence:    Fear of current or ex partner: Not on file    Emotionally abused: Not on file    Physically abused: Not on file    Forced sexual activity: Not on file  Other Topics Concern  . Not on file  Social History Narrative  . Not on file    Current Outpatient Medications on File Prior to Visit  Medication Sig Dispense Refill  . aspirin 325 MG EC tablet Take 325 mg by mouth daily.    . Blood Glucose Calibration (TRUE METRIX LEVEL 1) Low SOLN 1 Bottle by In Vitro route as needed. 1 each 0  . Blood Glucose Monitoring Suppl (ACCU-CHEK AVIVA PLUS) w/Device KIT USE AS DIRECTED 2 TIMES DAILY 1 kit 0  . glucose blood test strip Used to check blood sugars twice daily. DX code E11.9. 100 each 12  . Lancet Devices (TRUEDRAW LANCING DEVICE) MISC 1  Device by Does not apply route as needed. 1 each 0  . losartan-hydrochlorothiazide (HYZAAR) 100-12.5 MG tablet Take 1 tablet by mouth daily. 90 tablet 1  . metFORMIN (GLUCOPHAGE) 1000 MG tablet TAKE 1 TABLET BY MOUTH TWICE A DAY WITH A MEAL 180 tablet 0  . metoprolol tartrate (LOPRESSOR) 25 MG tablet Take 1 tablet (25 mg total) by mouth 2 (two) times daily. 180 tablet 1  . Na Sulfate-K Sulfate-Mg Sulf 17.5-3.13-1.6 GM/177ML SOLN Suprep (no substitutions)-TAKE AS DIRECTED. 354 mL 0  . OVER THE COUNTER MEDICATION Calcium 1200 with Vitamin D 1000- 1 tab daily    . pravastatin (PRAVACHOL) 20 MG tablet Take 1 tablet (20 mg total) by mouth daily. 90 tablet 1  . PROLIA 60 MG/ML SOLN injection   0  . repaglinide (PRANDIN) 1 MG tablet Take 1 tablet (1 mg total) by mouth 2 (two) times daily before a meal. 180 tablet 3  . tamoxifen (NOLVADEX) 20 MG tablet Take 1 tablet (20 mg total) by mouth daily. 90 tablet 3  . TRUEPLUS LANCETS 30G MISC 1 Package by Does not apply route daily. use to check blood sugar daily 1 each 0  . vitamin C (ASCORBIC ACID) 500 MG tablet Take 500 mg by mouth daily.     No current facility-administered medications on file prior to visit.     Allergies  Allergen Reactions  . Atorvastatin Other (See Comments)    dizziness  . Fosamax [Alendronate Sodium]     Cough,trouble shallowing, chest pain  . Lisinopril Cough  . Tetracycline Nausea And Vomiting    Family History  Problem Relation Age of Onset  . Colon cancer Mother 43  . Ovarian cancer Mother 4       'radium treatment'  . Cardiomyopathy Mother   . Colon polyps Mother   . Prostate cancer Maternal Uncle 28       metastatic 'died from it'  . Stomach cancer Maternal Grandmother 86  . Heart disease Maternal Grandmother   . Diabetes Maternal Grandfather   . Pancreatic cancer Maternal Aunt 66  . Prostate cancer Maternal Uncle 46       metastatic/ 'died from it'  . Cervical cancer Daughter   . Thyroid cancer Paternal  Uncle   . Thyroid cancer Cousin   . Esophageal cancer Neg Hx   . Rectal cancer Neg Hx     BP 130/80 (BP Location: Left Arm)  Pulse 85   Ht '5\' 5"'  (1.651 m)   Wt 212 lb 12.8 oz (96.5 kg)   SpO2 97%   BMI 35.41 kg/m    Review of Systems She denies hypoglycemia    Objective:   Physical Exam VITAL SIGNS:  See vs page GENERAL: no distress Pulses: dorsalis pedis intact bilat.   MSK: no deformity of the feet CV: 1+ bilat leg leg edema Skin:  no ulcer on the feet.  normal color and temp on the feet.  Neuro: sensation is intact to touch on the feet.  Ext: There is bilateral onychomycosis of the toenails.    A1c=7.4%    Assessment & Plan:  Type 2 DM: worse: we discussed.  She declines to increase repaglinide.   Edema: this limits rx options.    Patient Instructions  Please continue the same diabetes medications check your blood sugar once a day.  vary the time of day when you check, between before the 3 meals, and at bedtime.  also check if you have symptoms of your blood sugar being too high or too low.  please keep a record of the readings and bring it to your next appointment here (or you can bring the meter itself).  You can write it on any piece of paper.  please call us sooner if your blood sugar goes below 70, or if you have a lot of readings over 200.   Please come back for a follow-up appointment in 4 months.

## 2017-12-31 DIAGNOSIS — Z86000 Personal history of in-situ neoplasm of breast: Secondary | ICD-10-CM | POA: Diagnosis not present

## 2018-01-02 MED FILL — PROLIA 60 MG/ML SOLN: 60 | 180 days supply | Qty: 1 | Fill #0

## 2018-01-04 ENCOUNTER — Encounter: Payer: Self-pay | Admitting: Gastroenterology

## 2018-01-04 ENCOUNTER — Ambulatory Visit (AMBULATORY_SURGERY_CENTER): Payer: Medicare HMO | Admitting: Gastroenterology

## 2018-01-04 VITALS — BP 187/77 | HR 71 | Temp 97.3°F | Resp 13 | Ht 65.0 in | Wt 216.0 lb

## 2018-01-04 DIAGNOSIS — D122 Benign neoplasm of ascending colon: Secondary | ICD-10-CM

## 2018-01-04 DIAGNOSIS — I10 Essential (primary) hypertension: Secondary | ICD-10-CM | POA: Diagnosis not present

## 2018-01-04 DIAGNOSIS — E119 Type 2 diabetes mellitus without complications: Secondary | ICD-10-CM | POA: Diagnosis not present

## 2018-01-04 DIAGNOSIS — Z1211 Encounter for screening for malignant neoplasm of colon: Secondary | ICD-10-CM | POA: Diagnosis not present

## 2018-01-04 LAB — HM COLONOSCOPY

## 2018-01-04 MED ORDER — SODIUM CHLORIDE 0.9 % IV SOLN: 500.0000 mL | Freq: Once | INTRAVENOUS | Status: DC

## 2018-01-04 NOTE — Progress Notes (Signed)
Pt's states no medical or surgical changes since previsit or office visit. 

## 2018-01-04 NOTE — Progress Notes (Signed)
Report to PACU, RN, vss, BBS= Clear.  

## 2018-01-04 NOTE — Patient Instructions (Signed)
HANDOUTS GIVEN FOR POLYPS, DIVERTICULOSIS, HEMORRHOIDS AND HIGH FIBER DIET.  YOU HAD AN ENDOSCOPIC PROCEDURE TODAY AT THE [facility]:   Refer to the procedure report that was given to you for any specific questions about what was found during the examination.  If the procedure report does not answer your questions, please call your gastroenterologist to clarify.  If you requested that your care partner not be given the details of your procedure findings, then the procedure report has been included in a sealed envelope for you to review at your convenience later.  YOU SHOULD EXPECT: Some feelings of bloating in the abdomen. Passage of more gas than usual.  Walking can help get rid of the air that was put into your GI tract during the procedure and reduce the bloating. If you had a lower endoscopy (such as a colonoscopy or flexible sigmoidoscopy) you may notice spotting of blood in your stool or on the toilet paper. If you underwent a bowel prep for your procedure, you may not have a normal bowel movement for a few days.  Please Note:  You might notice some irritation and congestion in your nose or some drainage.  This is from the oxygen used during your procedure.  There is no need for concern and it should clear up in a day or so.  SYMPTOMS TO REPORT IMMEDIATELY:   Following lower endoscopy (colonoscopy or flexible sigmoidoscopy):  Excessive amounts of blood in the stool  Significant tenderness or worsening of abdominal pains  Swelling of the abdomen that is new, acute  Fever of 100F or higher   For urgent or emergent issues, a gastroenterologist can be reached at any hour by calling [phone].   DIET:  We do recommend a small meal at first, but then you may proceed to your regular diet.  Drink plenty of fluids but you should avoid alcoholic beverages for 24 hours.  ACTIVITY:  You should plan to take it easy for the rest of today and you should NOT DRIVE or use heavy  machinery until tomorrow (because of the sedation medicines used during the test).    FOLLOW UP: Our staff will call the number listed on your records the next business day following your procedure to check on you and address any questions or concerns that you may have regarding the information given to you following your procedure. If we do not reach you, we will leave a message.  However, if you are feeling well and you are not experiencing any problems, there is no need to return our call.  We will assume that you have returned to your regular daily activities without incident.  If any biopsies were taken you will be contacted by phone or by letter within the next 1-3 weeks.  Please call us at [phone] if you have not heard about the biopsies in 3 weeks.    SIGNATURES/CONFIDENTIALITY: You and/or your care partner have signed paperwork which will be entered into your electronic medical record.  These signatures attest to the fact that that the information above on your After Visit Summary has been reviewed and is understood.  Full responsibility of the confidentiality of this discharge information lies with you and/or your care-partner. 

## 2018-01-04 NOTE — Progress Notes (Signed)
Called to room to assist during endoscopic procedure.  Patient ID and intended procedure confirmed with present staff. Received instructions for my participation in the procedure from the performing physician.  

## 2018-01-04 NOTE — Op Note (Signed)
[facility] Patient Name: [name] Procedure Date: 01/04/2018 7:55 AM MRN: [id number] Endoscopist: Jackquline Denmark , MD Age: 72 Referring MD:  Date of Birth: 04/14/1945 Gender: Female Account #: [id number] Procedure:                Colonoscopy Indications:              Screening in patient at increased risk: Colorectal                            cancer in mother 25 or older Medicines:                Monitored Anesthesia Care Procedure:                Pre-Anesthesia Assessment:                           - Prior to the procedure, a History and Physical                            was performed, and patient medications and                            allergies were reviewed. The patient's tolerance of                            previous anesthesia was also reviewed. The risks                            and benefits of the procedure and the sedation                            options and risks were discussed with the patient.                            All questions were answered, and informed consent                            was obtained. Prior Anticoagulants: The patient has                            taken aspirin, last dose was day of procedure. ASA                            Grade Assessment: III - A patient with severe                            systemic disease. After reviewing the risks and                            benefits, the patient was deemed in satisfactory                            condition to undergo the procedure.  After obtaining informed consent, the colonoscope                            was passed under direct vision. Throughout the                            procedure, the patient's blood pressure, pulse, and                            oxygen saturations were monitored continuously. The                            Model PCF-H190DL [id number]) scope was introduced                            through the anus and advanced to the  the cecum,                            identified by appendiceal orifice and ileocecal                            valve. The colonoscopy was performed without                            difficulty. The patient tolerated the procedure                            well. The quality of the bowel preparation was                            adequate to identify polyps 6 mm and larger in                            size. Some retained solid stool and preparation.                            Aggressive suctioning and aspiration was performed.                            Overall, over 85-90% of the colonic mucosa was                            visualized satisfactorily. Of note, that small and                            flat lesions could be missed. Scope In: 8:00:32 AM Scope Out: 8:14:57 AM Scope Withdrawal Time: 0 hours 10 minutes 42 seconds  Total Procedure Duration: 0 hours 14 minutes 25 seconds  Findings:                 A 6 mm polyp was found in the mid ascending colon.                            The polyp  was sessile. The polyp was removed with a                            cold snare. Resection and retrieval were complete.                            Estimated blood loss: none.                           Multiple small and large-mouthed diverticula were                            found in the sigmoid colon, descending colon and                            ascending colon. There was no evidence of                            diverticular bleeding.                           Non-bleeding internal hemorrhoids were found during                            retroflexion. The hemorrhoids were small.                           The exam was otherwise without abnormality on                            direct and retroflexion views. Complications:            No immediate complications. Estimated Blood Loss:     Estimated blood loss: none. Impression:               - Colonic polyp status post polypectomy.                            - Pancolonic diverticulosis predominantly in the                            left colon.                           - Otherwise grossly normal colonoscopy to cecum. Recommendation:           - Patient has a contact number available for                            emergencies. The signs and symptoms of potential                            delayed complications were discussed with the                            patient. Return to normal activities tomorrow.  Written discharge instructions were provided to the                            patient.                           - High fiber diet.                           - Continue present medications.                           - Await pathology results.                           - Repeat colonoscopy for surveillance based on                            pathology results.                           - Return to GI office PRN. Jackquline Denmark, MD 01/04/2018 8:22:54 AM This report has been signed electronically.

## 2018-01-07 ENCOUNTER — Telehealth: Payer: Self-pay

## 2018-01-07 ENCOUNTER — Ambulatory Visit (INDEPENDENT_AMBULATORY_CARE_PROVIDER_SITE_OTHER): Payer: Medicare HMO | Admitting: Family Medicine

## 2018-01-07 ENCOUNTER — Encounter: Payer: Self-pay | Admitting: Family Medicine

## 2018-01-07 VITALS — BP 140/80 | HR 71 | Wt 213.0 lb

## 2018-01-07 DIAGNOSIS — Z23 Encounter for immunization: Secondary | ICD-10-CM | POA: Diagnosis not present

## 2018-01-07 DIAGNOSIS — I10 Essential (primary) hypertension: Secondary | ICD-10-CM | POA: Diagnosis not present

## 2018-01-07 DIAGNOSIS — M810 Age-related osteoporosis without current pathological fracture: Secondary | ICD-10-CM | POA: Diagnosis not present

## 2018-01-07 MED ORDER — DENOSUMAB 60 MG/ML ~~LOC~~ SOSY
60.0000 mg | PREFILLED_SYRINGE | Freq: Once | SUBCUTANEOUS | Status: AC
  Administered 2018-01-07: 60 mg via SUBCUTANEOUS

## 2018-01-07 NOTE — Telephone Encounter (Signed)
  Follow up Call-  Call back number 01/04/2018  Post procedure Call Back phone  # [phone]  Permission to leave phone message Yes  Some recent data might be hidden     Patient questions:  Do you have a fever, pain , or abdominal swelling? No. Pain Score  0 *  Have you tolerated food without any problems? Yes.    Have you been able to return to your normal activities? Yes.    Do you have any questions about your discharge instructions: Diet   No. Medications  No. Follow up visit  No.  Do you have questions or concerns about your Care? No.  Actions: * If pain score is 4 or above: No action needed, pain <4.

## 2018-01-07 NOTE — Progress Notes (Signed)
   Subjective:    Patient ID: [name], female    DOB: Jun 24, 1945, 72 y.o.   MRN: [id number]  HPI Chief Complaint  Patient presents with  . follow-up    follow-up on bp, 140-170's/ 60-80's   She is here to follow up on HTN. Reports taking Hyzaar and metoprolol daily without any side effects.  Checks BP at home and readings are sporadic from 140s-170s/60s-80s. She did not bring in her cuff to compare today.  Diet has not been very healthy and she has not been exercising due to fatigue.  She is on a statin.  She is getting Prolia injection today. Brought this from her pharmacy.   Denies fever, chills, dizziness, chest pain, palpitations, shortness of breath, abdominal pain, N/V/D.   Reviewed allergies, medications, past medical, surgical, family, and social history.   Review of Systems Pertinent positives and negatives in the history of present illness.     Objective:   Physical Exam BP 140/80   Pulse 71   Wt 213 lb (96.6 kg)   BMI 35.45 kg/m   Alert and oriented and in no acute distress. Not otherwise examined.       Assessment & Plan:  Essential hypertension  Age-related osteoporosis without current pathological fracture - Plan: denosumab (PROLIA) injection 60 mg  Needs flu shot - Plan: Flu vaccine HIGH DOSE PF (Fluzone High Dose)  She will continue on current medication for now. Discussed being more active and eating a healthier diet. She will bring in her BP machine for comparison in 4 weeks.  Challenged her to be more active.  Flu shot given.

## 2018-01-08 ENCOUNTER — Encounter: Payer: Self-pay | Admitting: Gastroenterology

## 2018-02-04 ENCOUNTER — Ambulatory Visit: Payer: Medicare HMO | Admitting: Family Medicine

## 2018-02-04 ENCOUNTER — Encounter: Payer: Self-pay | Admitting: Family Medicine

## 2018-02-04 VITALS — BP 160/84 | HR 68

## 2018-02-04 DIAGNOSIS — I10 Essential (primary) hypertension: Secondary | ICD-10-CM

## 2018-02-04 MED ORDER — PRAVASTATIN SODIUM 20 MG PO TABS: 20.0000 mg | ORAL_TABLET | Freq: Every day | ORAL | 1 refills | Status: DC

## 2018-02-04 MED ORDER — METOPROLOL TARTRATE 25 MG PO TABS: 25.0000 mg | ORAL_TABLET | Freq: Two times a day (BID) | ORAL | 1 refills | Status: DC

## 2018-02-04 NOTE — Progress Notes (Signed)
   Subjective:    Patient ID: [name], female    DOB: 1945-09-09, 72 y.o.   MRN: [id number]  HPI Chief Complaint  Patient presents with  . 4 week follow-up    4 week follow-up- HTN, running in 130-140. under alot of stress right now so bp is more elevated. her cuff is smaller than ours so reading higher   She is here to follow up on HTN.  States today is not a good day to check her blood pressure because she just found out this morning that her sister-in-law is dying.  States she feels extremely stressed but did not want to cancel her appointment.  She has to make a trip to [place].  She also takes care of her husband who is ill.  She brought her blood pressure cuff with her today but it is reading much higher than ours.  The cuff appears to be too small. States her blood pressure at home has been in the 130s/140s-60s. States she feels good. Reports increasing her walking significantly since her last visit.  Requests refill on metoprolol and pravastatin.  Reports good daily compliance with medications and no side effects.  Denies fever, chills, dizziness, chest pain, palpitations, shortness of breath, nausea, vomiting.   Review of Systems Pertinent positives and negatives in the history of present illness.     Objective:   Physical Exam BP (!) 160/84 Comment: our cuff  Alert and oriented and in no acute distress.  Respirations unlabored.  Skin is warm and dry.  Speech, mood and thought processes normal.      Assessment & Plan:  Essential hypertension  Her blood pressure at home has been well controlled.  Today her blood pressure is elevated and this is most likely due to the bad news regarding her sister-in-law's health and increased stress. She will keep an eye on her blood pressure and report back if it is continuing to stay elevated. Congratulated her on increasing her physical activity. Follow-up in 2 to 3 months.  She will continue seeing Dr. Loanne Drilling for  diabetes.

## 2018-03-02 DIAGNOSIS — E119 Type 2 diabetes mellitus without complications: Secondary | ICD-10-CM | POA: Diagnosis not present

## 2018-03-02 DIAGNOSIS — H5213 Myopia, bilateral: Secondary | ICD-10-CM | POA: Diagnosis not present

## 2018-03-02 LAB — HM DIABETES EYE EXAM

## 2018-03-29 ENCOUNTER — Telehealth: Payer: Self-pay | Admitting: Endocrinology

## 2018-03-29 ENCOUNTER — Other Ambulatory Visit: Payer: Self-pay

## 2018-03-29 DIAGNOSIS — E1165 Type 2 diabetes mellitus with hyperglycemia: Principal | ICD-10-CM

## 2018-03-29 DIAGNOSIS — IMO0001 Reserved for inherently not codable concepts without codable children: Secondary | ICD-10-CM

## 2018-03-29 MED ORDER — METFORMIN HCL 1000 MG PO TABS: ORAL_TABLET | ORAL | 1 refills | Status: DC

## 2018-03-29 NOTE — Telephone Encounter (Signed)
This has been done.

## 2018-03-29 NOTE — Telephone Encounter (Signed)
MEDICATION: Metformin  PHARMACY:  [facility] [address]  Fax RX to [facility] for Future Refills  IS THIS A 90 DAY SUPPLY : yes  IS PATIENT OUT OF MEDICATION: yes  IF NOT; HOW MUCH IS LEFT:   LAST APPOINTMENT DATE: @9 /17/2019  NEXT APPOINTMENT DATE:@1 /17/2020  DO WE HAVE YOUR PERMISSION TO LEAVE A DETAILED MESSAGE:yes  OTHER COMMENTS: Patient has been out of medication for a week-   **Let patient know to contact pharmacy at the end of the day to make sure medication is ready. **  ** Please notify patient to allow 48-72 hours to process**  **Encourage patient to contact the pharmacy for refills or they can request refills through [facility]**

## 2018-04-10 HISTORY — PX: BREAST BIOPSY: SHX20

## 2018-04-16 ENCOUNTER — Ambulatory Visit
Admission: RE | Admit: 2018-04-16 | Discharge: 2018-04-16 | Disposition: A | Discharge status: Home / Self Care | Payer: Medicare HMO | Source: Ambulatory Visit | Attending: Adult Health | Admitting: Adult Health

## 2018-04-16 ENCOUNTER — Other Ambulatory Visit: Payer: Self-pay | Admitting: Adult Health

## 2018-04-16 DIAGNOSIS — Z853 Personal history of malignant neoplasm of breast: Secondary | ICD-10-CM | POA: Diagnosis not present

## 2018-04-16 DIAGNOSIS — D0512 Intraductal carcinoma in situ of left breast: Secondary | ICD-10-CM

## 2018-04-16 DIAGNOSIS — R921 Mammographic calcification found on diagnostic imaging of breast: Secondary | ICD-10-CM | POA: Diagnosis not present

## 2018-04-16 HISTORY — DX: Personal history of irradiation: Z92.3

## 2018-04-19 ENCOUNTER — Ambulatory Visit
Admission: RE | Admit: 2018-04-19 | Discharge: 2018-04-19 | Disposition: A | Discharge status: Home / Self Care | Payer: Medicare HMO | Source: Ambulatory Visit | Attending: Adult Health | Admitting: Adult Health

## 2018-04-19 ENCOUNTER — Other Ambulatory Visit: Payer: Self-pay | Admitting: Adult Health

## 2018-04-19 DIAGNOSIS — D0512 Intraductal carcinoma in situ of left breast: Secondary | ICD-10-CM

## 2018-04-19 DIAGNOSIS — R921 Mammographic calcification found on diagnostic imaging of breast: Secondary | ICD-10-CM | POA: Diagnosis not present

## 2018-04-19 DIAGNOSIS — D242 Benign neoplasm of left breast: Secondary | ICD-10-CM | POA: Diagnosis not present

## 2018-04-24 ENCOUNTER — Ambulatory Visit (INDEPENDENT_AMBULATORY_CARE_PROVIDER_SITE_OTHER): Payer: Medicare HMO | Admitting: Family Medicine

## 2018-04-24 ENCOUNTER — Encounter: Payer: Self-pay | Admitting: Family Medicine

## 2018-04-24 VITALS — BP 120/80 | HR 75 | Ht 64.0 in | Wt 203.0 lb

## 2018-04-24 DIAGNOSIS — E7800 Pure hypercholesterolemia, unspecified: Secondary | ICD-10-CM

## 2018-04-24 DIAGNOSIS — I10 Essential (primary) hypertension: Secondary | ICD-10-CM | POA: Diagnosis not present

## 2018-04-24 DIAGNOSIS — Z7189 Other specified counseling: Secondary | ICD-10-CM

## 2018-04-24 DIAGNOSIS — Z7185 Encounter for immunization safety counseling: Secondary | ICD-10-CM

## 2018-04-24 DIAGNOSIS — E2839 Other primary ovarian failure: Secondary | ICD-10-CM | POA: Diagnosis not present

## 2018-04-24 DIAGNOSIS — Z23 Encounter for immunization: Secondary | ICD-10-CM

## 2018-04-24 DIAGNOSIS — M810 Age-related osteoporosis without current pathological fracture: Secondary | ICD-10-CM

## 2018-04-24 DIAGNOSIS — E559 Vitamin D deficiency, unspecified: Secondary | ICD-10-CM

## 2018-04-24 DIAGNOSIS — Z79899 Other long term (current) drug therapy: Secondary | ICD-10-CM | POA: Diagnosis not present

## 2018-04-24 DIAGNOSIS — E119 Type 2 diabetes mellitus without complications: Secondary | ICD-10-CM

## 2018-04-24 DIAGNOSIS — F329 Major depressive disorder, single episode, unspecified: Secondary | ICD-10-CM

## 2018-04-24 DIAGNOSIS — Z0000 Encounter for general adult medical examination without abnormal findings: Secondary | ICD-10-CM

## 2018-04-24 DIAGNOSIS — F32A Depression, unspecified: Secondary | ICD-10-CM

## 2018-04-24 MED ORDER — LOSARTAN POTASSIUM-HCTZ 100-12.5 MG PO TABS: 1.0000 | ORAL_TABLET | Freq: Every day | ORAL | 0 refills | Status: DC

## 2018-04-24 NOTE — Patient Instructions (Addendum)
It was a pleasure seeing you today.   Your blood pressure is normal today at 120/80.  Continue on your current medications.  [facility]  Check your calcium dose and make sure you are not taking more than 500 mg. I do recommend that you take at least 1000 international units of vitamin D daily.  If you would like to cut back the aspirin dose to 81 mg, I think this would be fine.  If you change your mind and would like to get a Pap smear, you may call and schedule this.   I am giving a prescription for Tdap (tetanus, diphtheria and pertussis) vaccine.  You can get this at your pharmacy.  I have also given you a prescription for the Shingrix vaccine.  I recommend you check with your insurance carrier before you get this to see if it is affordable.  I also recommend that you call your oncologist to clarify that they are okay with you getting this as well.  Look over the advanced directive forms and return these at your convenience.  If you have questions you can schedule an appointment to discuss them as well.  We will call you with your lab results and any new recommendations.  Follow-up in 6 months unless you need me sooner.  Continue seeing Dr. Loanne Drilling for your diabetes.

## 2018-04-24 NOTE — Progress Notes (Signed)
[name] is a 73 y.o. female who presents for annual wellness visit, CPE and follow-up on chronic medical conditions.  She has the following concerns:  HTN- at her previous visit in July 2019 we increased her Losartan from 50 mg to 100 mg. She is also on HCTZ 12.5 mg.  She does not check her blood pressure at home.  Her diabetes is managed by endocrinology.  In September her Hgb A1c was 7.4%. BS this morning was 134  Osteoporosis and estrogen deficiency- is on Prolia. Due again in April.  Vitamin D def- taking supplement   Reports taking a statin daily.  No side effects  Depression- her husband is very sick right now and having surgery next week for bladder cancer and he is in poor health. Sleeping ok, appetite is fine. Declines medications. Declines counseling.    Immunization History  Administered Date(s) Administered  . Influenza, High Dose Seasonal PF 01/07/2018  . Pneumococcal Conjugate-13 02/05/2017   Last Pap smear: refused  Last mammogram: January 2020 Last colonoscopy: 12/2017 Last DEXA: 04/2017 Dentist: dentures  Ophtho: [facility] 02/2018 Exercise: walks 5 days per week  Other doctors caring for patient include: Dr. Loanne Drilling- Endocrinologist    Depression screen:  See questionnaire below.  Depression screen [facility] 2/9 04/24/2018 09/07/2017 06/19/2017 05/09/2017 10/09/2016  Decreased Interest 3 0 0 0 0  Down, Depressed, Hopeless 3 0 0 0 0  PHQ - 2 Score 6 0 0 0 0    Fall Risk Screen: see questionnaire below. Fall Risk  04/24/2018 09/07/2017 06/19/2017 05/09/2017 10/09/2016  Falls in the past year? 0 No No No No  Number falls in past yr: 0 - - - -  Injury with Fall? 0 - - - -    ADL screen:  See questionnaire below Functional Status Survey: Is the patient deaf or have difficulty hearing?: No Does the patient have difficulty seeing, even when wearing glasses/contacts?: No Does the patient have difficulty concentrating, remembering, or making decisions?: No Does the  patient have difficulty walking or climbing stairs?: No Does the patient have difficulty dressing or bathing?: No Does the patient have difficulty doing errands alone such as visiting a doctor's office or shopping?: No   End of Life Discussion:  Patient does not have a living will and medical power of attorney  Review of Systems Constitutional: -fever, -chills, -sweats, -unexpected weight change, -anorexia, -fatigue Allergy: -sneezing, -itching, -congestion Dermatology: denies changing moles, rash, lumps, new worrisome lesions ENT: -runny nose, -ear pain, -sore throat, -hoarseness, -sinus pain, -teeth pain, -tinnitus, -hearing loss, -epistaxis Cardiology:  -chest pain, -palpitations, -edema, -orthopnea, -paroxysmal nocturnal dyspnea Respiratory: -cough, -shortness of breath, -dyspnea on exertion, -wheezing, -hemoptysis Gastroenterology: -abdominal pain, -nausea, -vomiting, -diarrhea, -constipation, -blood in stool, -changes in bowel movement, -dysphagia Hematology: -bleeding or bruising problems Musculoskeletal: -arthralgias, -myalgias, -joint swelling, -back pain, -neck pain, -cramping, -gait changes Ophthalmology: -vision changes, -eye redness, -itching, -discharge Urology: -dysuria, -difficulty urinating, -hematuria, -urinary frequency, -urgency, incontinence Neurology: -headache, -weakness, -tingling, -numbness, -speech abnormality, -memory loss, -falls, -dizziness Psychology:  +depressed mood, -agitation, +sleep problems    PHYSICAL EXAM:  BP 120/80   Pulse 75   Ht 5\' 4"  (1.626 m)   Wt 203 lb (92.1 kg)   BMI 34.84 kg/m   General Appearance: Alert, cooperative, no distress, appears stated age Head: Normocephalic, without obvious abnormality, atraumatic Eyes: PERRL, conjunctiva/corneas clear, EOM's intact, fundi benign Ears: Normal TM's and external ear canals Nose: Nares normal, mucosa normal, no drainage or sinus  tenderness Throat: Lips, mucosa, and tongue normal; teeth  and gums normal Neck: Supple, no lymphadenopathy; thyroid: no enlargement/tenderness/nodules; no carotid bruit or JVD Back: Spine nontender, no curvature, ROM normal, no CVA tenderness Lungs: Clear to auscultation bilaterally without wheezes, rales or ronchi; respirations unlabored Chest Wall: No tenderness or deformity Heart: Regular rate and rhythm, S1 and S2 normal, no murmur, rub or gallop Breast Exam: refuses  Abdomen: Soft, non-tender, nondistended, normoactive bowel sounds, no masses, no hepatosplenomegaly Genitalia: refuses  Extremities: No clubbing, cyanosis or edema Pulses: 2+ and symmetric all extremities Skin: Skin color, texture, turgor normal, no rashes or lesions Lymph nodes: Cervical, supraclavicular, and axillary nodes normal Neurologic: CNII-XII intact, normal strength, sensation and gait; reflexes 2+ and symmetric throughout Psych: Normal mood, affect, hygiene and grooming.  ASSESSMENT/PLAN: Medicare annual wellness visit, initial  Essential hypertension - Plan: losartan-hydrochlorothiazide (HYZAAR) 100-12.5 MG tablet, CBC with Differential/Platelet, Comprehensive metabolic panel  Gastroesophageal reflux disease, esophagitis presence not specified  Controlled type 2 diabetes mellitus without complication, without long-term current use of insulin (HCC) - Plan: CBC with Differential/Platelet, Comprehensive metabolic panel, TSH, T4, free, Vitamin B12  Age-related osteoporosis without current pathological fracture  Vitamin D deficiency - Plan: VITAMIN D 25 Hydroxy (Vit-D Deficiency, Fractures)  Pure hypercholesterolemia - Plan: Lipid panel  Estrogen deficiency  Depression, unspecified depression type  Routine general medical examination at a health care facility  Medication management - Plan: VITAMIN D 25 Hydroxy (Vit-D Deficiency, Fractures), Lipid panel  She is a pleasant 73 year old female who is here today for her annual Medicare wellness visit, CPE and  follow-up on chronic health conditions. Hypertension is under good control now.  Continue on current medication regimen. She is having issues with depression related to her husband's illness and upcoming surgery.  States she is managing fine due to her faith.  Declines counseling.  Declines medication.  She will let me know if this is worsening. Diabetes is managed by endocrinology. Hyperlipidemia-good statin use and no side effects.  Continue on this. Osteoporosis, estrogen deficiency and vitamin D deficiency-she is doing well on Prolia, getting adequate calcium and taking vitamin D supplement. Advance directive counseling done and she agrees to take on the living will and healthcare power of attorney paperwork to discuss with her family.  The most form was filled out in our office. Immunization counseling done.  Prescription for Tdap was given.  She will check with her oncologist as well as her insurance regarding the Shingrix vaccine. Follow-up pending labs or in 6 months.    Discussed monthly self breast exams and yearly mammograms; at least 30 minutes of aerobic activity at least 5 days/week and weight-bearing exercise 2x/week; proper sunscreen use reviewed; healthy diet, including goals of calcium and vitamin D intake and alcohol recommendations (less than or equal to 1 drink/day) reviewed; regular seatbelt use; changing batteries in smoke detectors.  Immunization recommendations discussed.  Colonoscopy recommendations reviewed   Medicare Attestation I have personally reviewed: The patient's medical and social history Their use of alcohol, tobacco or illicit drugs Their current medications and supplements The patient's functional ability including ADLs,fall risks, home safety risks, cognitive, and hearing and visual impairment Diet and physical activities Evidence for depression or mood disorders  The patient's weight, height, and BMI have been recorded in the chart.  I have made  referrals, counseling, and provided education to the patient based on review of the above and I have provided the patient with a written personalized care plan for preventive services.  [name], NP-C   04/24/2018

## 2018-04-25 LAB — CBC WITH DIFFERENTIAL/PLATELET
Basophils Absolute: 0.2 10*3/uL (ref 0.0–0.2)
Basos: 1 %
EOS (ABSOLUTE): 0.3 10*3/uL (ref 0.0–0.4)
Eos: 2 %
Hematocrit: 42.1 % (ref 34.0–46.6)
Hemoglobin: 13.6 g/dL (ref 11.1–15.9)
Immature Grans (Abs): 0 10*3/uL (ref 0.0–0.1)
Immature Granulocytes: 0 %
Lymphocytes Absolute: 2.2 10*3/uL (ref 0.7–3.1)
Lymphs: 20 %
MCH: 29.6 pg (ref 26.6–33.0)
MCHC: 32.3 g/dL (ref 31.5–35.7)
MCV: 92 fL (ref 79–97)
Monocytes Absolute: 0.8 10*3/uL (ref 0.1–0.9)
Monocytes: 7 %
Neutrophils Absolute: 7.8 10*3/uL — ABNORMAL HIGH (ref 1.4–7.0)
Neutrophils: 70 %
Platelets: 328 10*3/uL (ref 150–450)
RBC: 4.59 x10E6/uL (ref 3.77–5.28)
RDW: 12.9 % (ref 11.7–15.4)
WBC: 11.3 10*3/uL — ABNORMAL HIGH (ref 3.4–10.8)

## 2018-04-25 LAB — LIPID PANEL
Chol/HDL Ratio: 4.2 ratio (ref 0.0–4.4)
Cholesterol, Total: 151 mg/dL (ref 100–199)
HDL: 36 mg/dL — ABNORMAL LOW (ref >39)
LDL Calculated: 67 mg/dL (ref 0–99)
Triglycerides: 238 mg/dL — ABNORMAL HIGH (ref 0–149)
VLDL Cholesterol Cal: 48 mg/dL — ABNORMAL HIGH (ref 5–40)

## 2018-04-25 LAB — COMPREHENSIVE METABOLIC PANEL
ALBUMIN: 4.2 g/dL (ref 3.5–4.8)
ALT: 38 IU/L — ABNORMAL HIGH (ref 0–32)
AST: 47 IU/L — ABNORMAL HIGH (ref 0–40)
Albumin/Globulin Ratio: 1.7 (ref 1.2–2.2)
Alkaline Phosphatase: 49 IU/L (ref 39–117)
BILIRUBIN TOTAL: 0.4 mg/dL (ref 0.0–1.2)
BUN/Creatinine Ratio: 24 (ref 12–28)
BUN: 20 mg/dL (ref 8–27)
CO2: 21 mmol/L (ref 20–29)
Calcium: 10.1 mg/dL (ref 8.7–10.3)
Chloride: 100 mmol/L (ref 96–106)
Creatinine, Ser: 0.82 mg/dL (ref 0.57–1.00)
GFR calc Af Amer: 84 mL/min/{1.73_m2} (ref >59)
GFR calc non Af Amer: 73 mL/min/{1.73_m2} (ref >59)
GLUCOSE: 159 mg/dL — AB (ref 65–99)
Globulin, Total: 2.5 g/dL (ref 1.5–4.5)
Potassium: 4.7 mmol/L (ref 3.5–5.2)
Sodium: 140 mmol/L (ref 134–144)
Total Protein: 6.7 g/dL (ref 6.0–8.5)

## 2018-04-25 LAB — VITAMIN B12: Vitamin B-12: 453 pg/mL (ref 232–1245)

## 2018-04-25 LAB — VITAMIN D 25 HYDROXY (VIT D DEFICIENCY, FRACTURES): Vit D, 25-Hydroxy: 28.4 ng/mL — ABNORMAL LOW (ref 30.0–100.0)

## 2018-04-25 LAB — T4, FREE: Free T4: 1.53 ng/dL (ref 0.82–1.77)

## 2018-04-25 LAB — TSH: TSH: 1.79 u[IU]/mL (ref 0.450–4.500)

## 2018-04-26 ENCOUNTER — Ambulatory Visit: Payer: Medicare HMO | Admitting: Endocrinology

## 2018-05-08 NOTE — Progress Notes (Signed)
Patient Care Team: Girtha Rm, NP-C as PCP - General (Family Medicine) Nicholas Lose, MD as Consulting Physician (Hematology and Oncology) Eppie Gibson, MD as Attending Physician (Radiation Oncology) Erroll Luna, MD as Consulting Physician (General Surgery) Delice Bison Charlestine Massed, NP as Nurse Practitioner (Hematology and Oncology)  DIAGNOSIS:    ICD-10-CM   1. Ductal carcinoma in situ (DCIS) of left breast D05.12     SUMMARY OF ONCOLOGIC HISTORY:   Ductal carcinoma in situ (DCIS) of left breast   04/24/2017 Initial Diagnosis    Screening detected left breast calcifications in 3 areas UOQ, anterior group of calcifications 1.4 cm biopsy revealed high-grade DCIS with calcifications ER 95%, PR 95%, other 2 areas benign fibrocystic changes, Tis N0 stage 0    05/30/2017 Surgery    Left Lumpectomy: DCIS Intermediate Grade with necrosis and calcs, 0.9 cm, Er: 95%, PR 95%, TisNx (Stage 0)    07/06/2017 Genetic Testing    The Common Hereditary Cancer Panel + Thyroid cancer Panel was ordered.  The following genes were evaluated for sequence changes and exonic deletions/duplications: APC, ATM, AXIN2, BARD1, BMPR1A, BRCA1, BRCA2, BRIP1, CDH1, CDK4, CDKN2A (p14ARF), CDKN2A (p16INK4a), CHEK2, CTNNA1, DICER1, EPCAM*, GREM1*, KIT, MEN1, MLH1, MSH2, MSH3, MSH6, MUTYH, NBN, NF1, PALB2, PDGFRA, PMS2, POLD1, POLE, PRKAR1A, PTEN, RAD50, RAD51C, RAD51D, RET, SDHB, SDHC, SDHD, SMAD4, SMARCA4, STK11, TP53, TSC1, TSC2, VHL.  The following genes were evaluated for sequence changes only: HOXB13*, NTHL1*, SDHA  Results: Negative, no pathogenic variants identified.  The date of this test report is 07/06/2017.    07/10/2017 - 08/06/2017 Radiation Therapy    Adjuvant radiation therapy    08/07/2017 -  Anti-estrogen oral therapy    Adjuvant antiestrogen therapy with tamoxifen 20 mg daily x5 years     CHIEF COMPLIANT: Follow-up of Tamoxifen  INTERVAL HISTORY: [name] is a 73 y.o. with  above-mentioned history of left breast DCIS who underwent lumpectomy and radiation therapy and is currently on antiestrogen therapy with tamoxifen. Her most recent mammogram on 04/16/18 showed a 40m group of calcifications in the left breast for which a biopsy on 04/19/18 showed it to be fibroadenoma with calcifications. She presents to the clinic alone today and denies any side effects of Tamoxifen. She had hot flashes when first taking it which has since resolved. She denies any pain or discomfort in her breast. She has a heart murmur that she has known about for over 10 years but has never seen a cardiologist about-- she will follow up with her PCP. She had a breast exam done in 10/2017 by LWilber Bihari NP. She asked if she could get the new shingles vaccine. She reviewed her medication list with me.   REVIEW OF SYSTEMS:   Constitutional: Denies fevers, chills or abnormal weight loss Eyes: Denies blurriness of vision Ears, nose, mouth, throat, and face: Denies mucositis or sore throat Respiratory: Denies cough, dyspnea or wheezes Cardiovascular: Denies palpitation, chest discomfort Gastrointestinal:  Denies nausea, heartburn or change in bowel habits Skin: Denies abnormal skin rashes Lymphatics: Denies new lymphadenopathy or easy bruising Neurological: Denies numbness, tingling or new weaknesses Behavioral/Psych: Mood is stable, no new changes  Extremities: No lower extremity edema Breast: denies any pain or lumps or nodules in either breasts All other systems were reviewed with the patient and are negative.  I have reviewed the past medical history, past surgical history, social history and family history with the patient and they are unchanged from previous note.  ALLERGIES:  is allergic to  atorvastatin; fosamax [alendronate sodium]; lisinopril; and tetracycline.  MEDICATIONS:  Current Outpatient Medications  Medication Sig Dispense Refill  . aspirin 325 MG EC tablet Take 325 mg by mouth  daily.    . Blood Glucose Calibration (TRUE METRIX LEVEL 1) Low SOLN 1 Bottle by In Vitro route as needed. 1 each 0  . Blood Glucose Monitoring Suppl (ACCU-CHEK AVIVA PLUS) w/Device KIT USE AS DIRECTED 2 TIMES DAILY 1 kit 0  . glucose blood test strip Used to check blood sugars twice daily. DX code E11.9. 100 each 12  . Lancet Devices (TRUEDRAW LANCING DEVICE) MISC 1 Device by Does not apply route as needed. 1 each 0  . losartan-hydrochlorothiazide (HYZAAR) 100-12.5 MG tablet Take 1 tablet by mouth daily. 90 tablet 0  . metFORMIN (GLUCOPHAGE) 1000 MG tablet Take 1 tablet twice daily with a meal 180 tablet 1  . metoprolol tartrate (LOPRESSOR) 25 MG tablet Take 1 tablet (25 mg total) by mouth 2 (two) times daily. 180 tablet 1  . OVER THE COUNTER MEDICATION Calcium 1200 with Vitamin D 1000- 1 tab daily    . pravastatin (PRAVACHOL) 20 MG tablet Take 1 tablet (20 mg total) by mouth daily. 90 tablet 1  . PROLIA 60 MG/ML SOLN injection   0  . repaglinide (PRANDIN) 1 MG tablet Take 1 tablet (1 mg total) by mouth 2 (two) times daily before a meal. 180 tablet 3  . tamoxifen (NOLVADEX) 20 MG tablet Take 1 tablet (20 mg total) by mouth daily. 90 tablet 3  . TRUEPLUS LANCETS 30G MISC 1 Package by Does not apply route daily. use to check blood sugar daily 1 each 0  . vitamin C (ASCORBIC ACID) 500 MG tablet Take 500 mg by mouth daily.     No current facility-administered medications for this visit.     PHYSICAL EXAMINATION: ECOG PERFORMANCE STATUS: 0 - Asymptomatic  Vitals:   05/09/18 1426  BP: (!) 160/73  Pulse: 79  Resp: 17  Temp: 98.6 F (37 C)  SpO2: 98%   Filed Weights   05/09/18 1426  Weight: 94.1 kg    GENERAL: alert, no distress and comfortable SKIN: skin color, texture, turgor are normal, no rashes or significant lesions EYES: normal, Conjunctiva are pink and non-injected, sclera clear OROPHARYNX: no exudate, no erythema and lips, buccal mucosa, and tongue normal  NECK: supple,  thyroid normal size, non-tender, without nodularity LYMPH: no palpable lymphadenopathy in the cervical, axillary or inguinal LUNGS: clear to auscultation and percussion with normal breathing effort HEART: regular rate & rhythm and no lower extremity edema (+) pansystolic murmur in the mitral area ABDOMEN: abdomen soft, non-tender and normal bowel sounds MUSCULOSKELETAL: no cyanosis of digits and no clubbing  NEURO: alert & oriented x 3 with fluent speech, no focal motor/sensory deficits EXTREMITIES: No lower extremity edema  LABORATORY DATA:  I have reviewed the data as listed CMP Latest Ref Rng & Units 04/24/2018 10/18/2017 05/24/2017  Glucose 65 - 99 mg/dL 159(H) 96 154(H)  BUN 8 - 27 mg/dL _0 Creatinine 0.57 - 1.00 mg/dL 0.82 0.78 0.65  Sodium 134 - 144 mmol/L 140 140 140  Potassium 3.5 - 5.2 mmol/L 4.7 4.6 4.1  Chloride 96 - 106 mmol/L 100 102 107  CO2 20 - 29 mmol/L 21 20 20(L)  Calcium 8.7 - 10.3 mg/dL 10.1 9.5 9.1  Total Protein 6.0 - 8.5 g/dL 6.7 6.6 -  Total Bilirubin 0.0 - 1.2 mg/dL 0.4 0.3 -  Alkaline Phos 39 -  117 IU/L 49 42 -  AST 0 - 40 IU/L 47(H) 30 -  ALT 0 - 32 IU/L 38(H) 25 -    Lab Results  Component Value Date   WBC 11.3 (H) 04/24/2018   HGB 13.6 04/24/2018   HCT 42.1 04/24/2018   MCV 92 04/24/2018   PLT 328 04/24/2018   NEUTROABS 7.8 (H) 04/24/2018    ASSESSMENT & PLAN:  Ductal carcinoma in situ (DCIS) of left breast 05/30/17:Left Lumpectomy: DCIS Intermediate Grade with necrosis and calcs, 0.9 cm, Er: 95%, PR 95%, TisNx (Stage 0) Adj RT: 07/10/2017-08/06/2017  Plan: Adj Tamoxifen 20 mg daily X 5 years started 08/07/2017 Tamoxifen toxicities: Initially she had mild hot flashes but that resolved, denies any muscle cramps  Breast cancer surveillance: 1.  Breast exam July 2020: Benign 2.  Mammogram 04/16/2018: Indeterminate 5 mm group of calcifications in the left breast biopsy revealed fibroadenoma no evidence of malignancy.  Return to clinic in 1  year for follow-up    No orders of the defined types were placed in this encounter.  The patient has a good understanding of the overall plan. she agrees with it. she will call with any problems that may develop before the next visit here.  Nicholas Lose, MD 05/09/2018  Julious Oka Dorshimer am acting as scribe for Dr. Nicholas Lose.  I have reviewed the above documentation for accuracy and completeness, and I agree with the above.

## 2018-05-09 ENCOUNTER — Inpatient Hospital Stay: Payer: Medicare HMO | Attending: Hematology and Oncology | Admitting: Hematology and Oncology

## 2018-05-09 ENCOUNTER — Telehealth: Payer: Self-pay | Admitting: Hematology and Oncology

## 2018-05-09 DIAGNOSIS — Z170 Estrogen receptor positive status [ER+]: Secondary | ICD-10-CM

## 2018-05-09 DIAGNOSIS — Z923 Personal history of irradiation: Secondary | ICD-10-CM | POA: Insufficient documentation

## 2018-05-09 DIAGNOSIS — Z7982 Long term (current) use of aspirin: Secondary | ICD-10-CM | POA: Insufficient documentation

## 2018-05-09 DIAGNOSIS — Z79899 Other long term (current) drug therapy: Secondary | ICD-10-CM

## 2018-05-09 DIAGNOSIS — D0512 Intraductal carcinoma in situ of left breast: Secondary | ICD-10-CM

## 2018-05-09 DIAGNOSIS — Z7984 Long term (current) use of oral hypoglycemic drugs: Secondary | ICD-10-CM | POA: Insufficient documentation

## 2018-05-09 DIAGNOSIS — Z79810 Long term (current) use of selective estrogen receptor modulators (SERMs): Secondary | ICD-10-CM | POA: Insufficient documentation

## 2018-05-09 MED ORDER — TAMOXIFEN CITRATE 20 MG PO TABS: 20.0000 mg | ORAL_TABLET | Freq: Every day | ORAL | 3 refills | Status: DC

## 2018-05-09 NOTE — Telephone Encounter (Signed)
Gave avs and calendar ° °

## 2018-05-09 NOTE — Assessment & Plan Note (Signed)
05/30/17:Left Lumpectomy: DCIS Intermediate Grade with necrosis and calcs, 0.9 cm, Er: 95%, PR 95%, TisNx (Stage 0) Adj RT: 07/10/2017-08/06/2017  Plan: Adj Tamoxifen 20 mg daily X 5 years started 08/07/2017 Tamoxifen toxicities:  Breast cancer surveillance: 1.  Breast exam July 2020: Benign 2.  Mammogram 04/16/2018: Indeterminate 5 mm group of calcifications in the left breast biopsy revealed fibroadenoma no evidence of malignancy.  Return to clinic in 1 year for follow-up

## 2018-07-12 ENCOUNTER — Other Ambulatory Visit: Payer: Medicare HMO

## 2018-07-15 ENCOUNTER — Other Ambulatory Visit: Payer: Medicare HMO

## 2018-07-15 MED FILL — PROLIA 60 MG/ML SOLN: 60 | 180 days supply | Qty: 1 | Fill #0

## 2018-07-16 ENCOUNTER — Other Ambulatory Visit: Payer: Self-pay

## 2018-07-16 ENCOUNTER — Other Ambulatory Visit: Payer: Medicare HMO

## 2018-07-16 DIAGNOSIS — M810 Age-related osteoporosis without current pathological fracture: Secondary | ICD-10-CM | POA: Diagnosis not present

## 2018-07-16 MED ORDER — DENOSUMAB 60 MG/ML ~~LOC~~ SOSY
60.0000 mg | PREFILLED_SYRINGE | Freq: Once | SUBCUTANEOUS | Status: AC
  Administered 2018-07-16: 60 mg via SUBCUTANEOUS

## 2018-07-20 ENCOUNTER — Other Ambulatory Visit: Payer: Self-pay | Admitting: Family Medicine

## 2018-07-20 DIAGNOSIS — I10 Essential (primary) hypertension: Secondary | ICD-10-CM

## 2018-07-24 ENCOUNTER — Other Ambulatory Visit: Payer: Self-pay | Admitting: Family Medicine

## 2018-07-24 DIAGNOSIS — I10 Essential (primary) hypertension: Secondary | ICD-10-CM

## 2018-07-24 MED ORDER — LOSARTAN POTASSIUM-HCTZ 100-12.5 MG PO TABS: 1.0000 | ORAL_TABLET | Freq: Every day | ORAL | 0 refills | Status: DC

## 2018-08-23 ENCOUNTER — Other Ambulatory Visit: Payer: Self-pay | Admitting: Family Medicine

## 2018-08-23 DIAGNOSIS — I10 Essential (primary) hypertension: Secondary | ICD-10-CM

## 2018-09-21 ENCOUNTER — Other Ambulatory Visit: Payer: Self-pay | Admitting: Endocrinology

## 2018-09-21 DIAGNOSIS — IMO0001 Reserved for inherently not codable concepts without codable children: Secondary | ICD-10-CM

## 2018-09-23 NOTE — Telephone Encounter (Signed)
Please refill x 1 F/u is due  

## 2018-10-07 ENCOUNTER — Other Ambulatory Visit: Payer: Self-pay | Admitting: Family Medicine

## 2018-10-16 ENCOUNTER — Other Ambulatory Visit: Payer: Self-pay | Admitting: Endocrinology

## 2018-10-16 DIAGNOSIS — IMO0001 Reserved for inherently not codable concepts without codable children: Secondary | ICD-10-CM

## 2018-10-16 NOTE — Telephone Encounter (Signed)
LOV 12/25/17. Per your note, you advised follow-up appointment in 4 months. No f/u appt scheduled. Please advise how you would like to proceed re: request to refill.

## 2018-10-16 NOTE — Telephone Encounter (Signed)
Please refill x 1 F/u is due  

## 2018-10-24 ENCOUNTER — Encounter: Payer: Medicare HMO | Admitting: Family Medicine

## 2018-11-08 ENCOUNTER — Telehealth: Payer: Self-pay | Admitting: Endocrinology

## 2018-11-08 DIAGNOSIS — IMO0001 Reserved for inherently not codable concepts without codable children: Secondary | ICD-10-CM

## 2018-11-08 NOTE — Telephone Encounter (Signed)
Per Dr. Cordelia Pen response, Rx refill has been denied pending pt scheduling an appt. Can refill for a 30 day supply AFTER pt has scheduled an appt. Please call pt to schedule.

## 2018-11-08 NOTE — Telephone Encounter (Signed)
1.  schedule f/u appt 2.  Then please refill x 1, pending appt

## 2018-11-08 NOTE — Telephone Encounter (Signed)
Per your instructions on 10/16/18, Rx was refilled as follows:  Please refill x 1 F/u is due  Pt has not yet scheduled a follow up appt. Please advise how you wish to proceed.

## 2018-11-11 ENCOUNTER — Other Ambulatory Visit: Payer: Self-pay

## 2018-11-11 DIAGNOSIS — IMO0001 Reserved for inherently not codable concepts without codable children: Secondary | ICD-10-CM

## 2018-11-11 MED ORDER — METFORMIN HCL 1000 MG PO TABS: ORAL_TABLET | ORAL | 0 refills | Status: DC

## 2018-11-11 NOTE — Telephone Encounter (Signed)
Patient is scheduled for an appointment on 12/10/18 at 4:00 p.m.

## 2018-11-11 NOTE — Telephone Encounter (Signed)
metFORMIN (GLUCOPHAGE) 1000 MG tablet 60 tablet 0 11/11/2018    Sig: Take 1 tablet BID with a meal   Sent to pharmacy as: metFORMIN (GLUCOPHAGE) 1000 MG tablet   Notes to Pharmacy: Future refills will require an appt   E-Prescribing Status: Receipt confirmed by pharmacy (11/11/2018 8:55 AM EDT)    Per Dr. Cordelia Pen orders, 30 day supply has been sent as ordered

## 2018-11-15 ENCOUNTER — Other Ambulatory Visit: Payer: Self-pay

## 2018-11-15 ENCOUNTER — Ambulatory Visit (INDEPENDENT_AMBULATORY_CARE_PROVIDER_SITE_OTHER): Payer: Medicare HMO | Admitting: Family Medicine

## 2018-11-15 ENCOUNTER — Encounter: Payer: Self-pay | Admitting: Family Medicine

## 2018-11-15 VITALS — Ht 65.0 in | Wt 190.0 lb

## 2018-11-15 DIAGNOSIS — M810 Age-related osteoporosis without current pathological fracture: Secondary | ICD-10-CM | POA: Diagnosis not present

## 2018-11-15 DIAGNOSIS — Z79899 Other long term (current) drug therapy: Secondary | ICD-10-CM | POA: Diagnosis not present

## 2018-11-15 DIAGNOSIS — E1169 Type 2 diabetes mellitus with other specified complication: Secondary | ICD-10-CM

## 2018-11-15 DIAGNOSIS — E785 Hyperlipidemia, unspecified: Secondary | ICD-10-CM

## 2018-11-15 DIAGNOSIS — I10 Essential (primary) hypertension: Secondary | ICD-10-CM

## 2018-11-15 DIAGNOSIS — F32A Depression, unspecified: Secondary | ICD-10-CM

## 2018-11-15 DIAGNOSIS — E559 Vitamin D deficiency, unspecified: Secondary | ICD-10-CM

## 2018-11-15 DIAGNOSIS — F329 Major depressive disorder, single episode, unspecified: Secondary | ICD-10-CM | POA: Diagnosis not present

## 2018-11-15 DIAGNOSIS — D0512 Intraductal carcinoma in situ of left breast: Secondary | ICD-10-CM

## 2018-11-15 DIAGNOSIS — Z8249 Family history of ischemic heart disease and other diseases of the circulatory system: Secondary | ICD-10-CM | POA: Diagnosis not present

## 2018-11-15 NOTE — Progress Notes (Signed)
Subjective:  Documentation for virtual telephone encounter.  Documentation for virtual audio and video telecommunications through [place] encounter:  The patient was located at home. 2 patient identifiers used.  The provider was located in the office. The patient did consent to this visit and is aware of possible charges through their insurance for this visit.  The other persons participating in this telemedicine service were none.    Patient ID: [name], female    DOB: 07-15-1945, 73 y.o.   MRN: [id number]  HPI Chief Complaint  Patient presents with  . Medication Management   She was in our parking lot for her in office visit but was coughing a lot so we did not have her come inside. Instead, she drove home and we changed her visit to a virtual visit. She was fine with this and very understanding.   Due for a medication management visit.   Reports feeling fine. States she only coughs when she has her mask on and her throat gets dry.  Denies fever, chills, body aches, dizziness, headache, sore throat, chest pain, palpitations, shortness of breath, abdominal pain, N/V/D, urinary symptoms, LE edema.  No loss of taste or smell.  Denies any contacts with known person with Covid-19.    Other providers:  Oncologist- Dr. Lindi Adie Endocrinologist- Dr. Loanne Drilling   States her oncologist listened to her heart and told her that her heart murmur appeared louder. Since then she has a cardiologist, she would like to call and schedule with them. She saw Dr. Gwenlyn Found in 2019.   Echo in 2009 while at the ED for chest pain. This was done by Dr. Stanford Breed. Per her chart, she had a normal stress test August 2009.  Reports family history of heart disease. States her mother had cardiomyopathy and MGM with unknown heart disease.   HTN- 130-140/85 at home. Reports good compliance with medications and no side effects.   Diabetes- Dr. Loanne Drilling manages.   She has lost 12 lbs by focusing on  carbohydrate reduction. She is pleased.   HL- statin daily and no side effects   Depression- mood is ok. Has a lot of stress with her husband's health. Managing stress with deep breaths. Does not think her husband will live much longer and states it will be for the best when he passes since he does not have good quality of life.   GERD- omeprazole daily for years. Has not tried to cut back. Reflux has been well controlled.   Osteoporosis- she has been getting Prolia injections since March 2019. Doing well with these. Due again in 01/2019  States she had side effects such as difficulty swallowing, heart burn, "bone pain" with alendronate so she stopped it and started on Prolia in March 2019  Will be due for repeat bone density after 04/14/2019  Reports getting adequate calcium in her diet, taking a vitamin D supplement and usually walks for exercise when it is not so hot.   Breast cancer hx- managed by Dr. Lindi Adie   Reviewed allergies, medications, past medical, surgical, family, and social history.    Review of Systems Pertinent positives and negatives in the history of present illness.     Objective:   Physical Exam Ht 5\' 5"  (1.651 m)   Wt 190 lb (86.2 kg)   BMI 31.62 kg/m   Alert and oriented and in on acute distress. Not otherwise examined. No coughing or breathing issues while on the phone.       Assessment & Plan:  Essential hypertension - Plan: appears to be pretty well controlled. She plans to follow up with cardiology soon.   Vitamin D deficiency - Plan: continue on supplement.   Hyperlipidemia associated with type 2 diabetes mellitus ([place]) - Plan: doing well on statin. LDL 67 in January 2020. HDL only 36 however. She will continue statin.  Weight loss will also help and increasing activity as tolerated.   Family history of ischemic heart disease (IHD) - Plan: follow up with Dr. Gwenlyn Found, cardiology.   Medication management - Plan: not able to check labs today due to  coughing so we changed this to a virtual visit. Will need labs in the next couple of months or at her 3 month follow up.   Depression, unspecified depression type - Plan: she appears to being having good and bad days due to her husband's poor health. She reports managing ok. Declines counseling.   Age-related osteoporosis without current pathological fracture - Plan: continue on Prolia, vitamin D supplement and calcium in diet. Increase weight bearing exercises as tolerated. Due again for Prolia injection in October. Will be due for repeat DEXA in 05/2019  Ductal carcinoma in situ (DCIS) of left breast - Plan: managed by Dr. Lindi Adie. She had recent follow up and due back in one year.    Time spent on call was 26 minutes and in review of previous records 5 minutes total.  This virtual service is not related to other E/M service within previous 7 days.

## 2018-12-05 ENCOUNTER — Other Ambulatory Visit: Payer: Self-pay | Admitting: Endocrinology

## 2018-12-05 ENCOUNTER — Other Ambulatory Visit: Payer: Self-pay

## 2018-12-05 DIAGNOSIS — IMO0001 Reserved for inherently not codable concepts without codable children: Secondary | ICD-10-CM

## 2018-12-10 ENCOUNTER — Encounter: Payer: Self-pay | Admitting: Endocrinology

## 2018-12-10 ENCOUNTER — Other Ambulatory Visit: Payer: Self-pay

## 2018-12-10 ENCOUNTER — Ambulatory Visit (INDEPENDENT_AMBULATORY_CARE_PROVIDER_SITE_OTHER): Payer: Medicare HMO | Admitting: Endocrinology

## 2018-12-10 VITALS — BP 158/92 | HR 83 | Ht 65.0 in | Wt 196.4 lb

## 2018-12-10 DIAGNOSIS — I10 Essential (primary) hypertension: Secondary | ICD-10-CM | POA: Diagnosis not present

## 2018-12-10 DIAGNOSIS — R609 Edema, unspecified: Secondary | ICD-10-CM

## 2018-12-10 DIAGNOSIS — E119 Type 2 diabetes mellitus without complications: Secondary | ICD-10-CM

## 2018-12-10 DIAGNOSIS — E1165 Type 2 diabetes mellitus with hyperglycemia: Secondary | ICD-10-CM

## 2018-12-10 DIAGNOSIS — IMO0001 Reserved for inherently not codable concepts without codable children: Secondary | ICD-10-CM

## 2018-12-10 LAB — POCT GLYCOSYLATED HEMOGLOBIN (HGB A1C): Hemoglobin A1C: 8.8 % — AB (ref 4.0–5.6)

## 2018-12-10 MED ORDER — FARXIGA 10 MG PO TABS: 10.0000 mg | ORAL_TABLET | Freq: Every day | ORAL | 11 refills | Status: DC

## 2018-12-10 NOTE — Progress Notes (Signed)
error 

## 2018-12-10 NOTE — Patient Instructions (Addendum)
Your blood pressure is high today.  Please see your primary care provider soon, to have it rechecked. I have sent a prescription to your pharmacy, to add "Farxiga."  Please continue the same other diabetes medications.   check your blood sugar once a day.  vary the time of day when you check, between before the 3 meals, and at bedtime.  also check if you have symptoms of your blood sugar being too high or too low.  please keep a record of the readings and bring it to your next appointment here (or you can bring the meter itself).  You can write it on any piece of paper.  please call us sooner if your blood sugar goes below 70, or if you have a lot of readings over 200. Please come back for a follow-up appointment in 2 months.     

## 2018-12-10 NOTE — Progress Notes (Signed)
Subjective:    Patient ID: [name], female    DOB: Oct 22, 1945, 73 y.o.   MRN: [id number]  HPI Pt returns for f/u of diabetes mellitus: DM type: 2 Dx'ed: 5364 Complications: none Therapy: 2 oral meds GDM: 1985 DKA: never Severe hypoglycemia: never Pancreatitis: never Pancreatic imaging: never Other: she has never been on insulin; she cannot afford brand name meds.   Interval history: pt states she feels well in general.   Pt says cbg varies from 145-185.  There is no trend throughout the day.   Past Medical History:  Diagnosis Date  . Abnormal bone density screening   . Arthritis   . Cancer ([place]) 07/2017   left breast cancer  . Complication of anesthesia    "i wake up"  . Diverticulosis   . DM (diabetes mellitus) ([place])   . Family history of colon cancer   . Family history of ovarian cancer   . Family history of pancreatic cancer   . Family history of prostate cancer   . Family history of stomach cancer   . GERD (gastroesophageal reflux disease)   . History of radiation therapy 07/10/17- 08/06/17   40.05 Gy directed to the left breast in 15 fractions, followed by a boost of 10 Gy in 5 fractions.   Marland Kitchen HTN (hypertension)   . Hyperlipidemia   . Osteoporosis 06/15/2017  . Personal history of radiation therapy   . Poor compliance   . Vitamin D deficiency     Past Surgical History:  Procedure Laterality Date  . APPENDECTOMY    . BREAST BIOPSY Left 04/24/2017   malignant  . BREAST BIOPSY Left 04/24/2017   benign  . BREAST BIOPSY Left 04/25/2017   benign  . BREAST LUMPECTOMY Left 05/30/2017  . BREAST LUMPECTOMY WITH RADIOACTIVE SEED LOCALIZATION Left 05/30/2017   Procedure: LEFT BREAST LUMPECTOMY WITH RADIOACTIVE SEED LOCALIZATION;  Surgeon: Erroll Luna, MD;  Location: [facility];  Service: General;  Laterality: Left;  . COLONOSCOPY  06/12/2005   Schooler/normal exam="i woke up during it"  . DILATION AND CURETTAGE OF UTERUS    . FOREIGN BODY REMOVAL ESOPHAGEAL    .  TONSILLECTOMY    . TUBAL LIGATION      Social History   Socioeconomic History  . Marital status: Married    Spouse name: Not on file  . Number of children: Not on file  . Years of education: Not on file  . Highest education level: Not on file  Occupational History  . Not on file  Social Needs  . Financial resource strain: Not on file  . Food insecurity    Worry: Not on file    Inability: Not on file  . Transportation needs    Medical: Not on file    Non-medical: Not on file  Tobacco Use  . Smoking status: Never Smoker  . Smokeless tobacco: Never Used  Substance and Sexual Activity  . Alcohol use: No  . Drug use: No  . Sexual activity: Not Currently  Lifestyle  . Physical activity    Days per week: Not on file    Minutes per session: Not on file  . Stress: Not on file  Relationships  . Social Herbalist on phone: Not on file    Gets together: Not on file    Attends religious service: Not on file    Active member of club or organization: Not on file    Attends meetings of clubs or organizations:  Not on file    Relationship status: Not on file  . Intimate partner violence    Fear of current or ex partner: Not on file    Emotionally abused: Not on file    Physically abused: Not on file    Forced sexual activity: Not on file  Other Topics Concern  . Not on file  Social History Narrative  . Not on file    Current Outpatient Medications on File Prior to Visit  Medication Sig Dispense Refill  . Accu-Chek Softclix Lancets lancets 1 each by Other route 2 (two) times daily. Used to check blood sugars twice daily. DX code E11.9.    Marland Kitchen aspirin 325 MG EC tablet Take 325 mg by mouth daily.    . Blood Glucose Monitoring Suppl (ACCU-CHEK AVIVA PLUS) w/Device KIT USE AS DIRECTED 2 TIMES DAILY 1 kit 0  . calcium-vitamin D (OSCAL WITH D) 250-125 MG-UNIT tablet Take 1 tablet by mouth daily.    Marland Kitchen glucose blood test strip Used to check blood sugars twice daily. DX code  E11.9. 100 each 12  . losartan-hydrochlorothiazide (HYZAAR) 100-12.5 MG tablet Take 1 tablet by mouth daily. 90 tablet 0  . metFORMIN (GLUCOPHAGE) 1000 MG tablet TAKE 1 TABLET BY MOUTH TWICE A DAY WITH A MEAL *NEEDS APPT 60 tablet 0  . metoprolol tartrate (LOPRESSOR) 25 MG tablet TAKE 1 TABLET (25 MG TOTAL) BY MOUTH 2 (TWO) TIMES DAILY. 180 tablet 1  . pravastatin (PRAVACHOL) 20 MG tablet TAKE 1 TABLET (20 MG TOTAL) BY MOUTH DAILY. 90 tablet 0  . PROLIA 60 MG/ML SOLN injection   0  . repaglinide (PRANDIN) 1 MG tablet Take 1 tablet (1 mg total) by mouth 2 (two) times daily before a meal. 180 tablet 3  . tamoxifen (NOLVADEX) 20 MG tablet Take 1 tablet (20 mg total) by mouth daily. 90 tablet 3  . vitamin C (ASCORBIC ACID) 500 MG tablet Take 500 mg by mouth daily.     No current facility-administered medications on file prior to visit.     Allergies  Allergen Reactions  . Atorvastatin Other (See Comments)    dizziness  . Fosamax [Alendronate Sodium]     Cough,trouble shallowing, chest pain  . Lisinopril Cough  . Tetracycline Nausea And Vomiting    Family History  Problem Relation Age of Onset  . Colon cancer Mother 37  . Ovarian cancer Mother 71       'radium treatment'  . Cardiomyopathy Mother   . Colon polyps Mother   . Prostate cancer Maternal Uncle 81       metastatic 'died from it'  . Stomach cancer Maternal Grandmother 61  . Heart disease Maternal Grandmother   . Diabetes Maternal Grandfather   . Pancreatic cancer Maternal Aunt 66  . Prostate cancer Maternal Uncle 29       metastatic/ 'died from it'  . Cervical cancer Daughter   . Thyroid cancer Paternal Uncle   . Thyroid cancer Cousin   . Esophageal cancer Neg Hx   . Rectal cancer Neg Hx     BP (!) 158/92 (BP Location: Left Arm, Patient Position: Sitting, Cuff Size: Large)   Pulse 83   Ht 5' 5" (1.651 m)   Wt 196 lb 6.4 oz (89.1 kg)   SpO2 98%   BMI 32.68 kg/m   Review of Systems She has lost weight, due to  her efforts.      Objective:   Physical Exam VITAL SIGNS:  See  vs page GENERAL: no distress Pulses: dorsalis pedis intact bilat.   MSK: no deformity of the feet CV: 1+ bilat leg edema, and bilat vv's.   Skin:  no ulcer on the feet.  normal color and temp on the feet.   Neuro: sensation is intact to touch on the feet.    Lab Results  Component Value Date   HGBA1C 8.8 (A) 12/10/2018   Lab Results  Component Value Date   CREATININE 0.82 04/24/2018   BUN 20 04/24/2018   NA 140 04/24/2018   K 4.7 04/24/2018   CL 100 04/24/2018   CO2 21 04/24/2018      Assessment & Plan:  Type 2 DM: worse.  HTN: is noted today Edema: This limits rx options   Patient Instructions  Your blood pressure is high today.  Please see your primary care provider soon, to have it rechecked I have sent a prescription to your pharmacy, to add "Wilder [place]ade."  Please continue the same other diabetes medications.  check your blood sugar once a day.  vary the time of day when you check, between before the 3 meals, and at bedtime.  also check if you have symptoms of your blood sugar being too high or too low.  please keep a record of the readings and bring it to your next appointment here (or you can bring the meter itself).  You can write it on any piece of paper.  please call us sooner if your blood sugar goes below 70, or if you have a lot of readings over 200.   Please come back for a follow-up appointment in 2 months.

## 2018-12-28 ENCOUNTER — Other Ambulatory Visit: Payer: Self-pay | Admitting: Endocrinology

## 2018-12-28 DIAGNOSIS — IMO0001 Reserved for inherently not codable concepts without codable children: Secondary | ICD-10-CM

## 2019-01-09 ENCOUNTER — Telehealth: Payer: Self-pay | Admitting: Family Medicine

## 2019-01-09 MED ORDER — PRAVASTATIN SODIUM 20 MG PO TABS: 20.0000 mg | ORAL_TABLET | Freq: Every day | ORAL | 1 refills | Status: DC

## 2019-01-09 NOTE — Telephone Encounter (Signed)
done

## 2019-01-09 NOTE — Telephone Encounter (Signed)
Pt needs refill Pravastatin to [facility]

## 2019-01-20 MED FILL — PROLIA 60 MG/ML SOLN: 60 | 180 days supply | Qty: 1 | Fill #0

## 2019-01-21 ENCOUNTER — Other Ambulatory Visit: Payer: Self-pay

## 2019-01-21 ENCOUNTER — Other Ambulatory Visit: Payer: Self-pay | Admitting: Endocrinology

## 2019-01-21 ENCOUNTER — Other Ambulatory Visit: Payer: Medicare HMO

## 2019-01-21 DIAGNOSIS — M810 Age-related osteoporosis without current pathological fracture: Secondary | ICD-10-CM | POA: Diagnosis not present

## 2019-01-21 DIAGNOSIS — E1165 Type 2 diabetes mellitus with hyperglycemia: Secondary | ICD-10-CM

## 2019-01-21 MED ORDER — DENOSUMAB 60 MG/ML ~~LOC~~ SOSY
60.0000 mg | PREFILLED_SYRINGE | Freq: Once | SUBCUTANEOUS | Status: AC
  Administered 2019-01-21: 60 mg via SUBCUTANEOUS

## 2019-02-07 ENCOUNTER — Other Ambulatory Visit: Payer: Self-pay

## 2019-02-11 ENCOUNTER — Ambulatory Visit: Payer: Medicare HMO | Admitting: Endocrinology

## 2019-02-13 ENCOUNTER — Other Ambulatory Visit: Payer: Self-pay | Admitting: Endocrinology

## 2019-02-13 ENCOUNTER — Ambulatory Visit: Payer: Medicare HMO | Admitting: Family Medicine

## 2019-02-13 DIAGNOSIS — E1165 Type 2 diabetes mellitus with hyperglycemia: Secondary | ICD-10-CM

## 2019-02-18 ENCOUNTER — Other Ambulatory Visit: Payer: Self-pay | Admitting: Family Medicine

## 2019-02-18 DIAGNOSIS — I10 Essential (primary) hypertension: Secondary | ICD-10-CM

## 2019-03-08 ENCOUNTER — Other Ambulatory Visit: Payer: Self-pay | Admitting: Endocrinology

## 2019-03-08 DIAGNOSIS — E1165 Type 2 diabetes mellitus with hyperglycemia: Secondary | ICD-10-CM

## 2019-03-10 ENCOUNTER — Telehealth: Payer: Self-pay | Admitting: Family Medicine

## 2019-03-10 DIAGNOSIS — I10 Essential (primary) hypertension: Secondary | ICD-10-CM

## 2019-03-10 MED ORDER — METOPROLOL TARTRATE 25 MG PO TABS: 25.0000 mg | ORAL_TABLET | Freq: Two times a day (BID) | ORAL | 0 refills | Status: DC

## 2019-03-10 NOTE — Telephone Encounter (Signed)
Pt left message and needs refill on Metoprolol sent to [facility]

## 2019-03-10 NOTE — Telephone Encounter (Signed)
This is a Psychologist, clinical. Only 90 can be givien

## 2019-03-10 NOTE — Telephone Encounter (Signed)
Sent med to pharmacy  

## 2019-03-10 NOTE — Telephone Encounter (Signed)
Ok to do 90 days then.

## 2019-03-10 NOTE — Telephone Encounter (Signed)
Ok to give 30 days until I see her. It looks like she is overdue to see Dr. Gwenlyn Found, cardiology but we can talk about this at her visit.

## 2019-03-18 ENCOUNTER — Other Ambulatory Visit: Payer: Self-pay

## 2019-03-20 ENCOUNTER — Ambulatory Visit (INDEPENDENT_AMBULATORY_CARE_PROVIDER_SITE_OTHER): Payer: Medicare HMO | Admitting: Family Medicine

## 2019-03-20 ENCOUNTER — Ambulatory Visit
Admission: RE | Admit: 2019-03-20 | Discharge: 2019-03-20 | Disposition: A | Discharge status: Home / Self Care | Payer: Medicare HMO | Source: Ambulatory Visit | Attending: Family Medicine | Admitting: Family Medicine

## 2019-03-20 ENCOUNTER — Ambulatory Visit (INDEPENDENT_AMBULATORY_CARE_PROVIDER_SITE_OTHER): Payer: Medicare HMO | Admitting: Endocrinology

## 2019-03-20 ENCOUNTER — Encounter: Payer: Self-pay | Admitting: Endocrinology

## 2019-03-20 ENCOUNTER — Encounter: Payer: Self-pay | Admitting: Family Medicine

## 2019-03-20 ENCOUNTER — Other Ambulatory Visit: Payer: Self-pay

## 2019-03-20 VITALS — BP 160/90 | HR 85 | Ht 65.0 in | Wt 195.0 lb

## 2019-03-20 VITALS — BP 164/98 | HR 83 | Temp 97.8°F | Wt 195.0 lb

## 2019-03-20 DIAGNOSIS — E119 Type 2 diabetes mellitus without complications: Secondary | ICD-10-CM

## 2019-03-20 DIAGNOSIS — Z23 Encounter for immunization: Secondary | ICD-10-CM

## 2019-03-20 DIAGNOSIS — E7800 Pure hypercholesterolemia, unspecified: Secondary | ICD-10-CM

## 2019-03-20 DIAGNOSIS — I10 Essential (primary) hypertension: Secondary | ICD-10-CM | POA: Diagnosis not present

## 2019-03-20 DIAGNOSIS — R011 Cardiac murmur, unspecified: Secondary | ICD-10-CM | POA: Diagnosis not present

## 2019-03-20 DIAGNOSIS — F419 Anxiety disorder, unspecified: Secondary | ICD-10-CM

## 2019-03-20 DIAGNOSIS — R600 Localized edema: Secondary | ICD-10-CM | POA: Diagnosis not present

## 2019-03-20 HISTORY — DX: Essential (primary) hypertension: I10

## 2019-03-20 LAB — POCT GLYCOSYLATED HEMOGLOBIN (HGB A1C): Hemoglobin A1C: 8 % — AB (ref 4.0–5.6)

## 2019-03-20 MED ORDER — METOPROLOL TARTRATE 50 MG PO TABS: 50.0000 mg | ORAL_TABLET | Freq: Two times a day (BID) | ORAL | 1 refills | Status: DC

## 2019-03-20 MED ORDER — REPAGLINIDE 2 MG PO TABS: 2.0000 mg | ORAL_TABLET | Freq: Two times a day (BID) | ORAL | 3 refills | Status: DC

## 2019-03-20 MED ORDER — AMLODIPINE BESYLATE 5 MG PO TABS: 5.0000 mg | ORAL_TABLET | Freq: Every day | ORAL | 3 refills | Status: DC

## 2019-03-20 NOTE — Progress Notes (Signed)
Subjective:    Patient ID: [name], female    DOB: [birth year], 73 y.o.   MRN: [id number]  HPI Pt returns for f/u of diabetes mellitus: DM type: 2 Dx'ed: 2947 Complications: none Therapy: 2 oral meds GDM: 1985 DKA: never Severe hypoglycemia: never Pancreatitis: never Pancreatic imaging: never Other: she has never been on insulin; she cannot afford brand name meds.   Interval history: pt states she feels well in general.   Pt says cbg varies from 125-205.  It is in general higher as the day goes on.  She could not afford [place].   Past Medical History:  Diagnosis Date  . Abnormal bone density screening   . Arthritis   . Cancer ([place]) 07/2017   left breast cancer  . Complication of anesthesia    "i wake up"  . Diverticulosis   . DM (diabetes mellitus) ([place])   . Family history of colon cancer   . Family history of ovarian cancer   . Family history of pancreatic cancer   . Family history of prostate cancer   . Family history of stomach cancer   . GERD (gastroesophageal reflux disease)   . History of radiation therapy 07/10/17- 08/06/17   40.05 Gy directed to the left breast in 15 fractions, followed by a boost of 10 Gy in 5 fractions.   Marland Kitchen HTN (hypertension)   . Hyperlipidemia   . Osteoporosis 06/15/2017  . Personal history of radiation therapy   . Poor compliance   . Vitamin D deficiency     Past Surgical History:  Procedure Laterality Date  . APPENDECTOMY    . BREAST BIOPSY Left 04/24/2017   malignant  . BREAST BIOPSY Left 04/24/2017   benign  . BREAST BIOPSY Left 04/25/2017   benign  . BREAST LUMPECTOMY Left 05/30/2017  . BREAST LUMPECTOMY WITH RADIOACTIVE SEED LOCALIZATION Left 05/30/2017   Procedure: LEFT BREAST LUMPECTOMY WITH RADIOACTIVE SEED LOCALIZATION;  Surgeon: Erroll Luna, MD;  Location: [facility];  Service: General;  Laterality: Left;  . COLONOSCOPY  06/12/2005   Schooler/normal exam="i woke up during it"  . DILATION AND CURETTAGE OF UTERUS    .  FOREIGN BODY REMOVAL ESOPHAGEAL    . TONSILLECTOMY    . TUBAL LIGATION      Social History   Socioeconomic History  . Marital status: Married    Spouse name: Not on file  . Number of children: Not on file  . Years of education: Not on file  . Highest education level: Not on file  Occupational History  . Not on file  Tobacco Use  . Smoking status: Never Smoker  . Smokeless tobacco: Never Used  Substance and Sexual Activity  . Alcohol use: No  . Drug use: No  . Sexual activity: Not Currently  Other Topics Concern  . Not on file  Social History Narrative  . Not on file   Social Determinants of Health   Financial Resource Strain:   . Difficulty of Paying Living Expenses: Not on file  Food Insecurity:   . Worried About Charity fundraiser in the Last Year: Not on file  . Ran Out of Food in the Last Year: Not on file  Transportation Needs:   . Lack of Transportation (Medical): Not on file  . Lack of Transportation (Non-Medical): Not on file  Physical Activity:   . Days of Exercise per Week: Not on file  . Minutes of Exercise per Session: Not on file  Stress:   .  Feeling of Stress : Not on file  Social Connections:   . Frequency of Communication with Friends and Family: Not on file  . Frequency of Social Gatherings with Friends and Family: Not on file  . Attends Religious Services: Not on file  . Active Member of Clubs or Organizations: Not on file  . Attends Archivist Meetings: Not on file  . Marital Status: Not on file  Intimate Partner Violence:   . Fear of Current or Ex-Partner: Not on file  . Emotionally Abused: Not on file  . Physically Abused: Not on file  . Sexually Abused: Not on file    Current Outpatient Medications on File Prior to Visit  Medication Sig Dispense Refill  . Accu-Chek Softclix Lancets lancets 1 each by Other route 2 (two) times daily. Used to check blood sugars twice daily. DX code E11.9.    Marland Kitchen aspirin 325 MG EC tablet Take 325  mg by mouth daily.    . Blood Glucose Monitoring Suppl (ACCU-CHEK AVIVA PLUS) w/Device KIT USE AS DIRECTED 2 TIMES DAILY 1 kit 0  . calcium-vitamin D (OSCAL WITH D) 250-125 MG-UNIT tablet Take 1 tablet by mouth daily.    Marland Kitchen glucose blood test strip Used to check blood sugars twice daily. DX code E11.9. 100 each 12  . losartan-hydrochlorothiazide (HYZAAR) 100-12.5 MG tablet TAKE 1 TABLET BY MOUTH EVERY DAY 90 tablet 0  . metFORMIN (GLUCOPHAGE) 1000 MG tablet TAKE 1 TABLET BY MOUTH TWICE A DAY WITH MEALS 60 tablet 0  . pravastatin (PRAVACHOL) 20 MG tablet Take 1 tablet (20 mg total) by mouth daily. 90 tablet 1  . PROLIA 60 MG/ML SOLN injection   0  . tamoxifen (NOLVADEX) 20 MG tablet Take 1 tablet (20 mg total) by mouth daily. 90 tablet 3  . vitamin C (ASCORBIC ACID) 500 MG tablet Take 500 mg by mouth daily.     No current facility-administered medications on file prior to visit.    Allergies  Allergen Reactions  . Atorvastatin Other (See Comments)    dizziness  . Fosamax [Alendronate Sodium]     Cough,trouble shallowing, chest pain  . Lisinopril Cough  . Tetracycline Nausea And Vomiting    Family History  Problem Relation Age of Onset  . Colon cancer Mother 97  . Ovarian cancer Mother 46       'radium treatment'  . Cardiomyopathy Mother   . Colon polyps Mother   . Prostate cancer Maternal Uncle 59       metastatic 'died from it'  . Stomach cancer Maternal Grandmother 10  . Heart disease Maternal Grandmother   . Diabetes Maternal Grandfather   . Pancreatic cancer Maternal Aunt 66  . Prostate cancer Maternal Uncle 30       metastatic/ 'died from it'  . Cervical cancer Daughter   . Thyroid cancer Paternal Uncle   . Thyroid cancer Cousin   . Esophageal cancer Neg Hx   . Rectal cancer Neg Hx     BP (!) 160/90 (BP Location: Right Arm, Patient Position: Sitting, Cuff Size: Large)   Pulse 85   Ht '5\' 5"'  (1.651 m)   Wt 195 lb (88.5 kg)   SpO2 97%   BMI 32.45 kg/m    Review  of Systems She denies hypoglycemia.      Objective:   Physical Exam VITAL SIGNS:  See vs page GENERAL: no distress Pulses: dorsalis pedis intact bilat.   MSK: no deformity of the feet CV: 1+  bilat leg edema, and bilat vv's Skin:  no ulcer on the feet.  normal color and temp on the feet.  Neuro: sensation is intact to touch on the feet.   Ext: there is bilateral onychomycosis of the toenails.    Lab Results  Component Value Date   CREATININE 0.82 04/24/2018   BUN 20 04/24/2018   NA 140 04/24/2018   K 4.7 04/24/2018   CL 100 04/24/2018   CO2 21 04/24/2018    Lab Results  Component Value Date   HGBA1C 8.0 (A) 03/20/2019        Assessment & Plan:  HTN: is noted today Type 2 DM: she needs increased rx   Patient Instructions  Your blood pressure is high today.  Please see your primary care provider soon, to have it rechecked.   I have sent a prescription to your pharmacy, to increase the repaglinide.   Please continue the same metformin check your blood sugar once a day.  vary the time of day when you check, between before the 3 meals, and at bedtime.  also check if you have symptoms of your blood sugar being too high or too low.  please keep a record of the readings and bring it to your next appointment here (or you can bring the meter itself).  You can write it on any piece of paper.  please call us sooner if your blood sugar goes below 70, or if you have a lot of readings over 200.   Please come back for a follow-up appointment in 2 months.

## 2019-03-20 NOTE — Patient Instructions (Addendum)
Your blood pressure is high today.  Please see your primary care provider soon, to have it rechecked.   I have sent a prescription to your pharmacy, to increase the repaglinide.   Please continue the same metformin check your blood sugar once a day.  vary the time of day when you check, between before the 3 meals, and at bedtime.  also check if you have symptoms of your blood sugar being too high or too low.  please keep a record of the readings and bring it to your next appointment here (or you can bring the meter itself).  You can write it on any piece of paper.  please call us sooner if your blood sugar goes below 70, or if you have a lot of readings over 200.   Please come back for a follow-up appointment in 2 months.

## 2019-03-20 NOTE — Patient Instructions (Addendum)
Start on the amlodipine 5 mg once daily.  Continue on the Hyzaar.  I am increasing your metoprolol to 50 mg twice daily.  Go to [facility] imaging for your chest x-ray.  Gabriel Cirri will call you regarding your echocardiogram appointment  Continue limiting your salt intake.  Check with Dr. Lindi Adie regarding medication for anxiety and I will be happy to prescribe what ever he recommends.  Let him know that I wanted to put you on sertraline or citalopram  Let's do a 2 week virtual visit and then in office in 4 weeks.

## 2019-03-20 NOTE — Progress Notes (Signed)
Subjective:    Patient ID: [name], female    DOB: 1945-07-17, 73 y.o.   MRN: [id number]  HPI Chief Complaint  Patient presents with  . 3 month follow-up    3 month follow-up. bp was running high. running 140-145/75   She is here today due to uncontrolled HTN.  BP at her endocrinology appointment was 160/90 today. BP on 12/10/2018 was 158/92  Her BP at her [facility] on 04/24/2018 was normal at 120/80  Reports taking losartan-HCTZ 100-12.5 mg daily and metoprolol 25 mg twice daily   History of echo in 2009 and has seen Dr. Stanford Breed in the past.  History of heart murmur. States she was recently told by Dr. Lindi Adie that her murmur was "loud".   Denies fever, chills, worsening fatigue, headache, dizziness, chest pain, palpitations, DOE, orthopnea, abdominal pain, N/V/D, urinary symptoms.   States she is under a great deal of stress. Her husband has to start back on chemotherapy tomorrow and he has a lot of health problems. She is his caregiver.  Anxiety is affecting her total health now. She has never taken anything for anxiety.  Denies depression. No thoughts of hurting herself. States she has a lot of faith and has a good relationship with "the Reita Cliche" and this helps her manage.   Dr. Loanne Drilling is managing her diabetes and she had a visit with him today. Her Hgb A1c was 8.0%  LE edema for the past 2 months that is worse in the evenings and improves by morning.  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  She has compression stockings. States her diet is low in sodium generally.    Would like a flu shot today    Review of Systems Pertinent positives and negatives in the history of present illness.     Objective:   Physical Exam BP (!) 164/98   Pulse 83   Temp 97.8 F (36.6 C)   Wt 195 lb (88.5 kg)   BMI 32.45 kg/m  Alert and oriented and in no distress. Neck is supple.  Cardiac exam shows a regular rhythm with a 3/6 murmur in the mitral  area, no rubs or gallops. Lungs are clear to auscultation. RLE with 1+ pitting edema to sock line and LLE with non pitting edema.  Skin is warm and dry. PERRLA, EOMs intact. Denies SI      Assessment & Plan:  Uncontrolled hypertension - Plan: CBC with Differential, Comprehensive metabolic panel, Brain natriuretic peptide, DG Chest 2 View, amLODipine (NORVASC) 5 MG tablet, metoprolol tartrate (LOPRESSOR) 50 MG tablet, ECHOCARDIOGRAM COMPLETE, EKG 12-Lead -due to recently uncontrolled HTN, worsening murmur, and abnormal ECG suggesting a strain pattern, I will order an echocardiogram, chest XR and check labs. Continue on Hyzaar, increase metoprolol to 50 mg bid, and add amlodipine 5 mg. I discussed this patient with Dr. Redmond School and he agrees with the plan of care.  She is asymptomatic currently.  Consider referring her back to her cardiologist pending results.   Anxiety - Plan: TSH, T4, Free Related to stress due to her husbands health issues among other things.  Discussed staring her on an antianxiety medication such as sertraline or citalopram and these medications have a high risk of interaction with her tamoxifen. We discussed the potential for a decreased effect of the tamoxifen and will hold off for now. She plan to consult with Dr. Lindi Adie regarding taking an anti-anxiety medication. I discouraged taking a benzodiazapine.  She is not currently in favor of counseling.   Bilateral leg edema - Plan: Brain natriuretic peptide, DG Chest 2 View, ECHOCARDIOGRAM COMPLETE -suspect this is dependent edema due to improvement of edema in the mornings. Encouraged low sodium diet, moving more often, and wearing compression stockings.  HEART MURMUR - Plan: Brain natriuretic peptide, DG Chest 2 View, ECHOCARDIOGRAM COMPLETE, EKG 12-Lead This is not new but seems to have worsened. Asymptomatic. Advised to have better control of BP, chest XR, labs and echo ordered   Needs flu shot - Plan: Flu Vaccine QUAD High  Dose(Fluad) -advised of potential side effects   Pure hypercholesterolemia - Plan: Lipid Panel -continue statin therapy. Check lipids and follow up

## 2019-03-21 ENCOUNTER — Other Ambulatory Visit: Payer: Self-pay | Admitting: Internal Medicine

## 2019-03-21 DIAGNOSIS — R9431 Abnormal electrocardiogram [ECG] [EKG]: Secondary | ICD-10-CM

## 2019-03-21 DIAGNOSIS — R011 Cardiac murmur, unspecified: Secondary | ICD-10-CM

## 2019-03-21 DIAGNOSIS — I10 Essential (primary) hypertension: Secondary | ICD-10-CM

## 2019-03-21 DIAGNOSIS — R600 Localized edema: Secondary | ICD-10-CM

## 2019-03-21 LAB — LIPID PANEL
Chol/HDL Ratio: 4.2 ratio (ref 0.0–4.4)
Cholesterol, Total: 168 mg/dL (ref 100–199)
HDL: 40 mg/dL (ref >39)
LDL Chol Calc (NIH): 79 mg/dL (ref 0–99)
Triglycerides: 302 mg/dL — ABNORMAL HIGH (ref 0–149)
VLDL Cholesterol Cal: 49 mg/dL — ABNORMAL HIGH (ref 5–40)

## 2019-03-21 LAB — COMPREHENSIVE METABOLIC PANEL
ALT: 31 IU/L (ref 0–32)
AST: 55 IU/L — ABNORMAL HIGH (ref 0–40)
Albumin/Globulin Ratio: 1.6 (ref 1.2–2.2)
Albumin: 4.1 g/dL (ref 3.7–4.7)
Alkaline Phosphatase: 55 IU/L (ref 39–117)
BUN/Creatinine Ratio: 21 (ref 12–28)
BUN: 19 mg/dL (ref 8–27)
Bilirubin Total: 0.3 mg/dL (ref 0.0–1.2)
CO2: 21 mmol/L (ref 20–29)
Calcium: 10.4 mg/dL — ABNORMAL HIGH (ref 8.7–10.3)
Chloride: 101 mmol/L (ref 96–106)
Creatinine, Ser: 0.92 mg/dL (ref 0.57–1.00)
GFR calc Af Amer: 72 mL/min/{1.73_m2} (ref >59)
GFR calc non Af Amer: 63 mL/min/{1.73_m2} (ref >59)
Globulin, Total: 2.6 g/dL (ref 1.5–4.5)
Glucose: 172 mg/dL — ABNORMAL HIGH (ref 65–99)
Potassium: 4.7 mmol/L (ref 3.5–5.2)
Sodium: 138 mmol/L (ref 134–144)
Total Protein: 6.7 g/dL (ref 6.0–8.5)

## 2019-03-21 LAB — CBC WITH DIFFERENTIAL/PLATELET
Basophils Absolute: 0.1 10*3/uL (ref 0.0–0.2)
Basos: 1 %
EOS (ABSOLUTE): 0.2 10*3/uL (ref 0.0–0.4)
Eos: 2 %
Hematocrit: 39.8 % (ref 34.0–46.6)
Hemoglobin: 13.3 g/dL (ref 11.1–15.9)
Immature Grans (Abs): 0 10*3/uL (ref 0.0–0.1)
Immature Granulocytes: 0 %
Lymphocytes Absolute: 2.2 10*3/uL (ref 0.7–3.1)
Lymphs: 21 %
MCH: 29.8 pg (ref 26.6–33.0)
MCHC: 33.4 g/dL (ref 31.5–35.7)
MCV: 89 fL (ref 79–97)
Monocytes Absolute: 0.8 10*3/uL (ref 0.1–0.9)
Monocytes: 7 %
Neutrophils Absolute: 7 10*3/uL (ref 1.4–7.0)
Neutrophils: 69 %
Platelets: 284 10*3/uL (ref 150–450)
RBC: 4.47 x10E6/uL (ref 3.77–5.28)
RDW: 13.1 % (ref 11.7–15.4)
WBC: 10.4 10*3/uL (ref 3.4–10.8)

## 2019-03-21 LAB — TSH: TSH: 1.49 u[IU]/mL (ref 0.450–4.500)

## 2019-03-21 LAB — BRAIN NATRIURETIC PEPTIDE: BNP: 36 pg/mL (ref 0.0–100.0)

## 2019-03-21 LAB — T4, FREE: Free T4: 1.5 ng/dL (ref 0.82–1.77)

## 2019-03-25 ENCOUNTER — Encounter: Payer: Self-pay | Admitting: Hematology and Oncology

## 2019-03-25 ENCOUNTER — Other Ambulatory Visit: Payer: Self-pay | Admitting: Family Medicine

## 2019-03-25 ENCOUNTER — Encounter: Payer: Self-pay | Admitting: Family Medicine

## 2019-03-25 DIAGNOSIS — F419 Anxiety disorder, unspecified: Secondary | ICD-10-CM

## 2019-03-25 MED ORDER — VENLAFAXINE HCL ER 37.5 MG PO CP24: 37.5000 mg | ORAL_CAPSULE | Freq: Every day | ORAL | 1 refills | Status: DC

## 2019-03-31 ENCOUNTER — Other Ambulatory Visit: Payer: Self-pay | Admitting: Endocrinology

## 2019-03-31 ENCOUNTER — Other Ambulatory Visit (HOSPITAL_COMMUNITY): Payer: Medicare HMO

## 2019-03-31 DIAGNOSIS — E1165 Type 2 diabetes mellitus with hyperglycemia: Secondary | ICD-10-CM

## 2019-04-08 ENCOUNTER — Other Ambulatory Visit: Payer: Self-pay

## 2019-04-08 ENCOUNTER — Encounter: Payer: Self-pay | Admitting: Cardiovascular Disease

## 2019-04-08 ENCOUNTER — Ambulatory Visit (INDEPENDENT_AMBULATORY_CARE_PROVIDER_SITE_OTHER): Payer: Medicare HMO | Admitting: Cardiovascular Disease

## 2019-04-08 VITALS — BP 199/87 | HR 74 | Temp 96.8°F | Ht 65.0 in | Wt 196.2 lb

## 2019-04-08 DIAGNOSIS — E782 Mixed hyperlipidemia: Secondary | ICD-10-CM

## 2019-04-08 DIAGNOSIS — R011 Cardiac murmur, unspecified: Secondary | ICD-10-CM

## 2019-04-08 DIAGNOSIS — I10 Essential (primary) hypertension: Secondary | ICD-10-CM

## 2019-04-08 NOTE — Patient Instructions (Signed)
Medication Instructions:  Your physician recommends that you continue on your current medications as directed. Please refer to the Current Medication list given to you today.  If you need a refill on your cardiac medications before your next appointment, please call your pharmacy.   Lab work: NONE  Testing/Procedures: Your physician has requested that you have an echocardiogram. Echocardiography is a painless test that uses sound waves to create images of your heart. It provides your doctor with information about the size and shape of your heart and how well your heart's chambers and valves are working. This procedure takes approximately one hour. There are no restrictions for this procedure. [address]  Follow-Up: At [facility], you and your health needs are our priority.  As part of our continuing mission to provide you with exceptional heart care, we have created designated Provider Care Teams.  These Care Teams include your primary Cardiologist (physician) and Advanced Practice Providers (APPs -  Physician Assistants and Nurse Practitioners) who all work together to provide you with the care you need, when you need it. You may see Dr Gwenlyn Found or one of the following Advanced Practice Providers on your designated Care Team:    Kerin Ransom, PA-C  [place], [place]  Coletta Memos, [place]  Your physician wants you to follow-up in: 6 months with a Physicians Assistant  Your physician wants you to follow-up in: 1 year with Dr Gwenlyn Found

## 2019-04-08 NOTE — Assessment & Plan Note (Signed)
History of hyperlipidemia on statin therapy with recent lipid profile performed 03/20/2019 revealing total cholesterol 168, LDL 67 HDL 40

## 2019-04-08 NOTE — Assessment & Plan Note (Signed)
History of essential hypertension her blood pressure measured today at 199/87.  She says she takes her blood pressure at home and it usually much less than this.  Blood pressure medicines have been adjusted by her PCP.  Currently she is on amlodipine, Lopressor, losartan and hydrochlorothiazide.

## 2019-04-08 NOTE — Progress Notes (Signed)
04/08/2019 [name]   12-10-1945  [id number]  Primary Physician Girtha Rm, NP-C Primary Cardiologist: Lorretta Harp MD Garret Reddish, [place], [place]  HPI:  [name] is a 73 y.o.  moderately overweight married Caucasian female mother of 2, grandmother and 3 grandchildren . 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 She was referred by Fayrene Fearing NP  .  I last saw her in the office 06/28/2017.  She has a history of treated hypertension and hyperlipidemia. She has never smoked. There is no family history. She's never had a heart attack or stroke. She denies chest pain or shortness of breath. She has had this cancer and had surgical excision of this by Dr. Brantley Stage recently and underwent radiation therapy.  Since I saw her a year ago she has remained stable.  She denies shortness of breath.  She gets occasional atypical chest pain occurring every several months which is self-limited.  Her PCP did auscultate a cardiac murmur thought to be louder than in the past and scheduled her to have a 2D echocardiogram.  She has mild dependent edema.   Current Meds  Medication Sig  . Accu-Chek Softclix Lancets lancets 1 each by Other route 2 (two) times daily. Used to check blood sugars twice daily. DX code E11.9.  Marland Kitchen amLODipine (NORVASC) 5 MG tablet Take 1 tablet (5 mg total) by mouth daily.  Marland Kitchen aspirin 325 MG EC tablet Take 325 mg by mouth daily.  . Blood Glucose Monitoring Suppl (ACCU-CHEK AVIVA PLUS) w/Device KIT USE AS DIRECTED 2 TIMES DAILY  . calcium-vitamin D (OSCAL WITH D) 250-125 MG-UNIT tablet Take 1 tablet by mouth daily.  Marland Kitchen glucose blood test strip Used to check blood sugars twice daily. DX code E11.9.  . losartan-hydrochlorothiazide (HYZAAR) 100-12.5 MG tablet TAKE 1 TABLET BY MOUTH EVERY DAY  . metFORMIN (GLUCOPHAGE) 1000 MG tablet TAKE 1 TABLET BY MOUTH TWICE A DAY WITH MEALS  . metoprolol tartrate (LOPRESSOR) 50 MG tablet Take 1 tablet (50  mg total) by mouth 2 (two) times daily.  . pravastatin (PRAVACHOL) 20 MG tablet Take 1 tablet (20 mg total) by mouth daily.  Marland Kitchen PROLIA 60 MG/ML SOLN injection   . repaglinide (PRANDIN) 2 MG tablet Take 1 tablet (2 mg total) by mouth 2 (two) times daily before a meal.  . tamoxifen (NOLVADEX) 20 MG tablet Take 1 tablet (20 mg total) by mouth daily.  Marland Kitchen venlafaxine XR (EFFEXOR-XR) 37.5 MG 24 hr capsule Take 1 capsule (37.5 mg total) by mouth daily with breakfast.  . vitamin C (ASCORBIC ACID) 500 MG tablet Take 500 mg by mouth daily.     Allergies  Allergen Reactions  . Atorvastatin Other (See Comments)    dizziness  . Fosamax [Alendronate Sodium]     Cough,trouble shallowing, chest pain  . Lisinopril Cough  . Tetracycline Nausea And Vomiting    Social History   Socioeconomic History  . Marital status: Married    Spouse name: Not on file  . Number of children: Not on file  . Years of education: Not on file  . Highest education level: Not on file  Occupational History  . Not on file  Tobacco Use  . Smoking status: Never Smoker  . Smokeless tobacco: Never Used  Substance and Sexual Activity  . Alcohol use: No  . Drug use: No  . Sexual activity: Not Currently  Other Topics Concern  . Not on file  Social  History Narrative  . Not on file   Social Determinants of Health   Financial Resource Strain:   . Difficulty of Paying Living Expenses: Not on file  Food Insecurity:   . Worried About Charity fundraiser in the Last Year: Not on file  . Ran Out of Food in the Last Year: Not on file  Transportation Needs:   . Lack of Transportation (Medical): Not on file  . Lack of Transportation (Non-Medical): Not on file  Physical Activity:   . Days of Exercise per Week: Not on file  . Minutes of Exercise per Session: Not on file  Stress:   . Feeling of Stress : Not on file  Social Connections:   . Frequency of Communication with Friends and Family: Not on file  . Frequency of Social  Gatherings with Friends and Family: Not on file  . Attends Religious Services: Not on file  . Active Member of Clubs or Organizations: Not on file  . Attends Archivist Meetings: Not on file  . Marital Status: Not on file  Intimate Partner Violence:   . Fear of Current or Ex-Partner: Not on file  . Emotionally Abused: Not on file  . Physically Abused: Not on file  . Sexually Abused: Not on file     Review of Systems: General: negative for chills, fever, night sweats or weight changes.  Cardiovascular: negative for chest pain, dyspnea on exertion, edema, orthopnea, palpitations, paroxysmal nocturnal dyspnea or shortness of breath Dermatological: negative for rash Respiratory: negative for cough or wheezing Urologic: negative for hematuria Abdominal: negative for nausea, vomiting, diarrhea, bright red blood per rectum, melena, or hematemesis Neurologic: negative for visual changes, syncope, or dizziness All other systems reviewed and are otherwise negative except as noted above.    Blood pressure (!) 199/87, pulse 74, temperature (!) 96.8 F (36 C), height '5\' 5"'  (1.651 m), weight 196 lb 3.2 oz (89 kg), SpO2 97 %.  General appearance: alert and no distress Neck: no adenopathy, no carotid bruit, no JVD, supple, symmetrical, trachea midline and thyroid not enlarged, symmetric, no tenderness/mass/nodules Lungs: clear to auscultation bilaterally Heart: 2/6 systolic ejection murmur at the base consistent with aortic stenosis and/or sclerosis. Extremities: 1+ edema on the right Pulses: 2+ and symmetric Skin: Skin color, texture, turgor normal. No rashes or lesions Neurologic: Alert and oriented X 3, normal strength and tone. Normal symmetric reflexes. Normal coordination and gait  EKG not performed today  ASSESSMENT AND PLAN:   Hyperlipidemia History of hyperlipidemia on statin therapy with recent lipid profile performed 03/20/2019 revealing total cholesterol 168, LDL 67 HDL  40  Essential hypertension History of essential hypertension her blood pressure measured today at 199/87.  She says she takes her blood pressure at home and it usually much less than this.  Blood pressure medicines have been adjusted by her PCP.  Currently she is on amlodipine, Lopressor, losartan and hydrochlorothiazide.  HEART MURMUR History of heart murmur louder by several people auscultating it recently.  We will schedule a 2D echocardiogram.      Lorretta Harp MD [facility], [facility] 04/08/2019 11:51 AM

## 2019-04-08 NOTE — Assessment & Plan Note (Signed)
History of heart murmur louder by several people auscultating it recently.  We will schedule a 2D echocardiogram.

## 2019-04-13 DIAGNOSIS — R7401 Elevation of levels of liver transaminase levels: Secondary | ICD-10-CM | POA: Insufficient documentation

## 2019-04-13 DIAGNOSIS — E781 Pure hyperglyceridemia: Secondary | ICD-10-CM

## 2019-04-13 HISTORY — DX: Pure hyperglyceridemia: E78.1

## 2019-04-13 NOTE — Progress Notes (Signed)
   Subjective:  Documentation for virtual audio and video telecommunications through [place] encounter:  The patient was located at home. 2 patient identifiers used.  The provider was located in the office. The patient did consent to this visit and is aware of possible charges through their insurance for this visit.  The other persons participating in this telemedicine service were none. .   Patient ID: [name], female    DOB: 09/26/1945, 74 y.o.   MRN: [id number]  HPI Chief Complaint  Patient presents with  . follow-up    follow-up on bp   This is virtual visit to follow up on anxiety and starting on venlafaxine XR 2 weeks ago.  She thinks she feels calmer and sleeping better. Denies any side effects.   HTN- uncontrolled. Elevated at her previous visit with me and recently at her visit with Dr. Gwenlyn Found.  She previously reported BP at home was better controlled but now her home readings are elevated also.  Denies any cardiac symptoms.  Reports good daily compliance with medications.  Hyzaar 100-12.5 mg, metoprolol 50 mg twice daily, amlodipine 5 mg.   She has an echocardiogram scheduled for this Thursday.   Elevated AST- does not drink alcohol. Does not take NSAID or Tylenol often.   Triglycerides elevated- was not fasting at her last visit.  Taking a fish oil  Reviewed allergies, medications, past medical, surgical, family, and social history.    Review of Systems Pertinent positives and negatives in the history of present illness.     Objective:   Physical Exam BP (!) 171/85   Pulse 97   Wt 190 lb (86.2 kg)   BMI 31.62 kg/m   Alert and oriented and in no distress. Respirations unlabored.      Assessment & Plan:  Anxiety -anxiety appears to be better managed with Effexor. Continue on current dose.   Uncontrolled hypertension -she is on a 4 medication regimen and reports good daily compliance. She has an echocardiogram scheduled later this week. Saw Dr.  Gwenlyn Found recently.  Would appreciate the assistance of Dr. Gwenlyn Found in her HTN management and possibly a referral to the HTN clinic if he feels this would be beneficial.   Mixed hyperlipidemia -continue statin therapy   Hypertriglyceridemia -reports taking a fish oil and her statin. Counseling on diet. Consider Vascepa  Elevated AST (SGOT) -will follow up on this at follow up visit. No regular alcohol or NSAID use.     Time spent on call was 12 minutes and in review of previous records 15 minutes total.  This virtual service is not related to other E/M service within previous 7 days

## 2019-04-14 ENCOUNTER — Ambulatory Visit: Payer: Medicare HMO | Admitting: Cardiology

## 2019-04-14 ENCOUNTER — Other Ambulatory Visit: Payer: Self-pay

## 2019-04-14 ENCOUNTER — Encounter: Payer: Self-pay | Admitting: Family Medicine

## 2019-04-14 ENCOUNTER — Ambulatory Visit (INDEPENDENT_AMBULATORY_CARE_PROVIDER_SITE_OTHER): Payer: Medicare HMO | Admitting: Family Medicine

## 2019-04-14 VITALS — BP 171/85 | HR 97 | Wt 190.0 lb

## 2019-04-14 DIAGNOSIS — F419 Anxiety disorder, unspecified: Secondary | ICD-10-CM | POA: Diagnosis not present

## 2019-04-14 DIAGNOSIS — I10 Essential (primary) hypertension: Secondary | ICD-10-CM | POA: Diagnosis not present

## 2019-04-14 DIAGNOSIS — R7401 Elevation of levels of liver transaminase levels: Secondary | ICD-10-CM | POA: Diagnosis not present

## 2019-04-14 DIAGNOSIS — E781 Pure hyperglyceridemia: Secondary | ICD-10-CM

## 2019-04-14 DIAGNOSIS — E782 Mixed hyperlipidemia: Secondary | ICD-10-CM | POA: Diagnosis not present

## 2019-04-16 ENCOUNTER — Other Ambulatory Visit: Payer: Self-pay | Admitting: Family Medicine

## 2019-04-16 DIAGNOSIS — F419 Anxiety disorder, unspecified: Secondary | ICD-10-CM

## 2019-04-17 ENCOUNTER — Other Ambulatory Visit: Payer: Self-pay

## 2019-04-17 ENCOUNTER — Ambulatory Visit (HOSPITAL_COMMUNITY): Payer: Medicare HMO | Attending: Cardiology

## 2019-04-17 DIAGNOSIS — I10 Essential (primary) hypertension: Secondary | ICD-10-CM | POA: Insufficient documentation

## 2019-04-21 NOTE — Progress Notes (Signed)
Patient ID: [name]                 DOB: 16-Aug-1945                      MRN: [id number]     HPI:  [name] is a 74 y.o. female referred by Dr. Gwenlyn Found to HTN clinic. PMH includes uncontrolled hypertension, hyperlipidemia, depression, hx of cancer, DM-II, and GERD. Increased stress in life, she is her husband's caregiver, and still works as Patent examiner. Denies dizziness, falls, lightheadedness, shortness of breath or blurry vision. Reports some swelling on leg but not significantly. Usually takes her medication at 7am but waiting to take today's medication after her appointment.   Current HTN meds:  amlodipine 4m daily  Losartan/HCTZ 100/12.534mdaily at 7am Metoprolol 5025mID 7am & 7pm   Intolerance:  Lisinopril - cough  BP goal: <130/80  Family History: HTN in mother,   Social History: denies tobacco us [place] alcohol  Diet: mainly home cooked meals, avoid canned products, drinks coffee during the whole day (4-5 cups).   Exercise: activities of daily living only  Home BP readings: no records provided; 170/80 recent per patient's recollection.  Wt Readings from Last 3 Encounters:  04/22/19 198 lb (89.8 kg)  04/14/19 190 lb (86.2 kg)  04/08/19 196 lb 3.2 oz (89 kg)   BP Readings from Last 3 Encounters:  04/22/19 (!) 178/82  04/14/19 (!) 171/85  04/08/19 (!) 199/87   Pulse Readings from Last 3 Encounters:  04/22/19 80  04/14/19 97  04/08/19 74    Past Medical History:  Diagnosis Date  . Abnormal bone density screening   . Arthritis   . [place]   left breast cancer  . Complication of anesthesia    "i wake up"  . Diverticulosis   . DM (diabetes mellitus) ([facility] . Family history of colon cancer   . Family history of ovarian cancer   . Family history of pancreatic cancer   . Family history of prostate cancer   . Family history of stomach cancer   . GERD (gastroesophageal reflux disease)   . History of radiation therapy  07/10/17- 08/06/17   40.05 Gy directed to the left breast in 15 fractions, followed by a boost of 10 Gy in 5 fractions.   . HMarland KitchenN (hypertension)   . Hyperlipidemia   . Osteoporosis 06/15/2017  . Personal history of radiation therapy   . Poor compliance   . Vitamin D deficiency     Current Outpatient Medications on File Prior to Visit  Medication Sig Dispense Refill  . Accu-Chek Softclix Lancets lancets 1 each by Other route 2 (two) times daily. Used to check blood sugars twice daily. DX code E11.9.    . aMarland KitchenLODipine (NORVASC) 5 MG tablet Take 1 tablet (5 mg total) by mouth daily. 90 tablet 3  . aspirin 325 MG EC tablet Take 325 mg by mouth daily.    . Blood Glucose Monitoring Suppl (ACCU-CHEK AVIVA PLUS) w/Device KIT USE AS DIRECTED 2 TIMES DAILY 1 kit 0  . calcium-vitamin D (OSCAL WITH D) 250-125 MG-UNIT tablet Take 1 tablet by mouth daily.    . gMarland Kitchenucose blood test strip Used to check blood sugars twice daily. DX code E11.9. 100 each 12  . losartan-hydrochlorothiazide (HYZAAR) 100-12.5 MG tablet TAKE 1 TABLET BY MOUTH EVERY DAY 90 tablet 0  . pravastatin (PRAVACHOL) 20 MG tablet Take 1  tablet (20 mg total) by mouth daily. 90 tablet 1  . PROLIA 60 MG/ML SOLN injection   0  . repaglinide (PRANDIN) 2 MG tablet Take 1 tablet (2 mg total) by mouth 2 (two) times daily before a meal. 180 tablet 3  . tamoxifen (NOLVADEX) 20 MG tablet Take 1 tablet (20 mg total) by mouth daily. 90 tablet 3  . venlafaxine XR (EFFEXOR-XR) 37.5 MG 24 hr capsule TAKE 1 CAPSULE BY MOUTH DAILY WITH BREAKFAST. 90 capsule 0  . vitamin C (ASCORBIC ACID) 500 MG tablet Take 500 mg by mouth daily.     No current facility-administered medications on file prior to visit.    Allergies  Allergen Reactions  . Atorvastatin Other (See Comments)    dizziness  . Fosamax [Alendronate Sodium]     Cough,trouble shallowing, chest pain  . Lisinopril Cough  . Tetracycline Nausea And Vomiting    Blood pressure (!) 178/82, pulse 80, height  '5\' 6"'  (1.676 m), weight 198 lb (89.8 kg), SpO2 97 %.  Essential hypertension  Blood pressure remains well above foal of <130/80. Patient denies symptoms and reports compliance with all medication. Instructions to avoid "salty six" provided as well as tips to increase physical activity by 10-71mnutes per day. Will stop metoprolol and start carvedilol 12.529mBID for better BP and HR control. Change amlodipine 86m79mo bedtime administration. Plan to follow up by phone in 2 weeks and increase carvedilol to 286m10mD if blood pressure and HR remains stable. Patient is to monitor BP and bring records to next clinic follow up in 4 weeks.  During next office visit, we will change losartan/HCT to valsartan/HCT if additional BP control needed.    Xara Paulding Rodriguez-Guzman PharmD, BCPS, CPP [id number] 3:19 PM

## 2019-04-22 ENCOUNTER — Ambulatory Visit (INDEPENDENT_AMBULATORY_CARE_PROVIDER_SITE_OTHER): Payer: Medicare HMO | Admitting: Pharmacist

## 2019-04-22 ENCOUNTER — Other Ambulatory Visit: Payer: Self-pay | Admitting: Endocrinology

## 2019-04-22 ENCOUNTER — Other Ambulatory Visit: Payer: Self-pay

## 2019-04-22 VITALS — BP 178/82 | HR 80 | Ht 66.0 in | Wt 198.0 lb

## 2019-04-22 DIAGNOSIS — I10 Essential (primary) hypertension: Secondary | ICD-10-CM | POA: Diagnosis not present

## 2019-04-22 DIAGNOSIS — E1165 Type 2 diabetes mellitus with hyperglycemia: Secondary | ICD-10-CM

## 2019-04-22 MED ORDER — CARVEDILOL 12.5 MG PO TABS: 12.5000 mg | ORAL_TABLET | Freq: Two times a day (BID) | ORAL | 1 refills | Status: DC

## 2019-04-22 NOTE — Assessment & Plan Note (Addendum)
Blood pressure remains well above foal of <130/80. Patient denies symptoms and reports compliance with all medication. Instructions to avoid "salty six" provided as well as tips to increase physical activity by 10-9minutes per day. Will stop metoprolol and start carvedilol 12.5mg  BID for better BP and HR control. Change amlodipine 5mg  to bedtime administration. Plan to follow up by phone in 2 weeks and increase carvedilol to 25mg  BID if blood pressure and HR remains stable. Patient is to monitor BP and bring records to next clinic follow up in 4 weeks.  During next office visit, we will change losartan/HCT to valsartan/HCT if additional BP control needed.

## 2019-04-22 NOTE — Patient Instructions (Addendum)
Return for a  follow up appointment in 2 weeks on the phone and 5 week in office  Check your blood pressure at home daily (if able) and keep record of the readings.  Take your BP meds as follows: *STOP taking metoprolol* *START taking carvedilol 12.5mg  twice daily* *TAKE amlodipine 5mg  every day at bedtime* *CONTINUE all other medication as prescribed*  Bring all of your meds, your BP cuff and your record of home blood pressures to your next appointment.  Exercise as you're able, try to walk approximately 30 minutes per day.  Keep salt intake to a minimum, especially watch canned and prepared boxed foods.  Eat more fresh fruits and vegetables and fewer canned items.  Avoid eating in fast food restaurants.    HOW TO TAKE YOUR BLOOD PRESSURE: . Rest 5 minutes before taking your blood pressure. .  Don't smoke or drink caffeinated beverages for at least 30 minutes before. . Take your blood pressure before (not after) you eat. . Sit comfortably with your back supported and both feet on the floor (don't cross your legs). . Elevate your arm to heart level on a table or a desk. . Use the proper sized cuff. It should fit smoothly and snugly around your bare upper arm. There should be enough room to slip a fingertip under the cuff. The bottom edge of the cuff should be 1 inch above the crease of the elbow. . Ideally, take 3 measurements at one sitting and record the average.

## 2019-04-29 ENCOUNTER — Encounter: Payer: Self-pay | Admitting: Family Medicine

## 2019-04-29 ENCOUNTER — Other Ambulatory Visit: Payer: Self-pay

## 2019-04-29 ENCOUNTER — Ambulatory Visit (INDEPENDENT_AMBULATORY_CARE_PROVIDER_SITE_OTHER): Payer: Medicare HMO | Admitting: Family Medicine

## 2019-04-29 VITALS — BP 160/90 | HR 70 | Temp 98.6°F | Wt 197.4 lb

## 2019-04-29 DIAGNOSIS — R7401 Elevation of levels of liver transaminase levels: Secondary | ICD-10-CM

## 2019-04-29 DIAGNOSIS — F419 Anxiety disorder, unspecified: Secondary | ICD-10-CM | POA: Diagnosis not present

## 2019-04-29 DIAGNOSIS — I10 Essential (primary) hypertension: Secondary | ICD-10-CM

## 2019-04-29 DIAGNOSIS — E781 Pure hyperglyceridemia: Secondary | ICD-10-CM | POA: Diagnosis not present

## 2019-04-29 NOTE — Progress Notes (Signed)
   Subjective:    Patient ID: [name], female    DOB: [birth year], 74 y.o.   MRN: [id number]  HPI Chief Complaint  Patient presents with  . follow-up    follow-up on bp and aniexty. doing well on med   Here to follow up on anxiety. States she feels much better on Effexor on no side effects.  Sleeping better.   HTN being managed by HTN clinic.   Her AST was elevated at her previous visit.  Denies alcohol use ever. No NSAID use.   Triglycerides significantly elevated at previous visit. States she has not been taking fish oil but has this at home.   Denies fever, chills, chest pain, palpitations, shortness of breath, abdominal pain, N/V/D.    Reviewed allergies, medications, past medical, surgical, family, and social history.    Review of Systems Pertinent positives and negatives in the history of present illness.     Objective:   Physical Exam BP (!) 160/90   Pulse 70   Temp 98.6 F (37 C)   Wt 197 lb 6.4 oz (89.5 kg)   BMI 31.86 kg/m   Alert and oriented and in no acute distress.  Not otherwise examined.      Assessment & Plan:  Anxiety  Elevated AST (SGOT) - Plan: Comprehensive metabolic panel  Hypertriglyceridemia  Uncontrolled hypertension  She is doing well on Effexor and no side effects.  We will keep her on this medication for now at this dose. Elevated AST at previous visit.  Recheck this today. Recommend starting on fish oil for triglycerides.  Discussed the possibility of trying Vascepa.  She will discuss this with the pharmacist at her next appointment. She is aware that her blood pressure still elevated today.  She is due to follow-up with the Pharm.D. at hypertension clinic Recommend she return in 3 months for annual wellness visit

## 2019-04-29 NOTE — Patient Instructions (Addendum)
Continue on the Effexor.   Ok to ask your pharmacist about Vascepa for triglycerides. I do recommend taking fish oil if you do not take Vascepa.

## 2019-04-30 LAB — COMPREHENSIVE METABOLIC PANEL
ALT: 21 IU/L (ref 0–32)
AST: 25 IU/L (ref 0–40)
Albumin/Globulin Ratio: 1.9 (ref 1.2–2.2)
Albumin: 3.9 g/dL (ref 3.7–4.7)
Alkaline Phosphatase: 44 IU/L (ref 39–117)
BUN/Creatinine Ratio: 19 (ref 12–28)
BUN: 14 mg/dL (ref 8–27)
Bilirubin Total: 0.3 mg/dL (ref 0.0–1.2)
CO2: 23 mmol/L (ref 20–29)
Calcium: 8.9 mg/dL (ref 8.7–10.3)
Chloride: 103 mmol/L (ref 96–106)
Creatinine, Ser: 0.75 mg/dL (ref 0.57–1.00)
GFR calc Af Amer: 93 mL/min/{1.73_m2} (ref >59)
GFR calc non Af Amer: 80 mL/min/{1.73_m2} (ref >59)
Globulin, Total: 2.1 g/dL (ref 1.5–4.5)
Glucose: 117 mg/dL — ABNORMAL HIGH (ref 65–99)
Potassium: 4.6 mmol/L (ref 3.5–5.2)
Sodium: 141 mmol/L (ref 134–144)
Total Protein: 6 g/dL (ref 6.0–8.5)

## 2019-05-09 ENCOUNTER — Telehealth: Payer: Self-pay | Admitting: Pharmacist

## 2019-05-09 MED ORDER — CARVEDILOL 12.5 MG PO TABS: 25.0000 mg | ORAL_TABLET | Freq: Two times a day (BID) | ORAL | 1 refills | Status: DC

## 2019-05-09 NOTE — Telephone Encounter (Signed)
Patient stated her BP has improved but still above goal with 160/71 in AM and 158/71 in PM. Pulse remains around 70s.  Will increase carvedilol to 25mg  twice daily and follow with HTN clinic as previously scheduled.

## 2019-05-11 NOTE — Progress Notes (Signed)
Patient Care Team: Girtha Rm, NP-C as PCP - General (Family Medicine) Nicholas Lose, MD as Consulting Physician (Hematology and Oncology) Eppie Gibson, MD as Attending Physician (Radiation Oncology) Erroll Luna, MD as Consulting Physician (General Surgery) Delice Bison Charlestine Massed, NP as Nurse Practitioner (Hematology and Oncology)  DIAGNOSIS:    ICD-10-CM   1. Ductal carcinoma in situ (DCIS) of left breast  D05.12     SUMMARY OF ONCOLOGIC HISTORY: Oncology History  Ductal carcinoma in situ (DCIS) of left breast  04/24/2017 Initial Diagnosis   Screening detected left breast calcifications in 3 areas UOQ, anterior group of calcifications 1.4 cm biopsy revealed high-grade DCIS with calcifications ER 95%, PR 95%, other 2 areas benign fibrocystic changes, Tis N0 stage 0   05/30/2017 Surgery   Left Lumpectomy: DCIS Intermediate Grade with necrosis and calcs, 0.9 cm, Er: 95%, PR 95%, TisNx (Stage 0)   07/06/2017 Genetic Testing   The Common Hereditary Cancer Panel + Thyroid cancer Panel was ordered.  The following genes were evaluated for sequence changes and exonic deletions/duplications: APC, ATM, AXIN2, BARD1, BMPR1A, BRCA1, BRCA2, BRIP1, CDH1, CDK4, CDKN2A (p14ARF), CDKN2A (p16INK4a), CHEK2, CTNNA1, DICER1, EPCAM*, GREM1*, KIT, MEN1, MLH1, MSH2, MSH3, MSH6, MUTYH, NBN, NF1, PALB2, PDGFRA, PMS2, POLD1, POLE, PRKAR1A, PTEN, RAD50, RAD51C, RAD51D, RET, SDHB, SDHC, SDHD, SMAD4, SMARCA4, STK11, TP53, TSC1, TSC2, VHL.  The following genes were evaluated for sequence changes only: HOXB13*, NTHL1*, SDHA  Results: Negative, no pathogenic variants identified.  The date of this test report is 07/06/2017.   07/10/2017 - 08/06/2017 Radiation Therapy   Adjuvant radiation therapy   08/07/2017 -  Anti-estrogen oral therapy   Adjuvant antiestrogen therapy with tamoxifen 20 mg daily x5 years     CHIEF COMPLIANT: Follow-up of left breast DCIS on tamoxifen  INTERVAL HISTORY: [name] is  a 74 y.o. with above-mentioned history of left breast DCIS who underwent lumpectomy, radiation, and is currently on antiestrogen therapy with tamoxifen. She presents to the clinic today for annual follow-up.  Patient denies any adverse effects to tamoxifen therapy.  Denies any hot flashes or arthralgias or myalgias.  Denies any lumps or nodules in the breast.  ALLERGIES:  is allergic to atorvastatin; fosamax [alendronate sodium]; lisinopril; and tetracycline.  MEDICATIONS:  Current Outpatient Medications  Medication Sig Dispense Refill  . Accu-Chek Softclix Lancets lancets 1 each by Other route 2 (two) times daily. Used to check blood sugars twice daily. DX code E11.9.    Marland Kitchen amLODipine (NORVASC) 5 MG tablet Take 1 tablet (5 mg total) by mouth daily. 90 tablet 3  . aspirin 325 MG EC tablet Take 325 mg by mouth daily.    . Blood Glucose Monitoring Suppl (ACCU-CHEK AVIVA PLUS) w/Device KIT USE AS DIRECTED 2 TIMES DAILY 1 kit 0  . calcium-vitamin D (OSCAL WITH D) 250-125 MG-UNIT tablet Take 1 tablet by mouth daily.    . carvedilol (COREG) 12.5 MG tablet Take 2 tablets (25 mg total) by mouth 2 (two) times daily. 60 tablet 1  . glucose blood test strip Used to check blood sugars twice daily. DX code E11.9. 100 each 12  . losartan-hydrochlorothiazide (HYZAAR) 100-12.5 MG tablet TAKE 1 TABLET BY MOUTH EVERY DAY 90 tablet 0  . metFORMIN (GLUCOPHAGE) 1000 MG tablet TAKE 1 TABLET BY MOUTH TWICE A DAY WITH MEALS 60 tablet 0  . pravastatin (PRAVACHOL) 20 MG tablet Take 1 tablet (20 mg total) by mouth daily. 90 tablet 1  . PROLIA 60 MG/ML SOLN injection  0  . repaglinide (PRANDIN) 2 MG tablet Take 1 tablet (2 mg total) by mouth 2 (two) times daily before a meal. 180 tablet 3  . tamoxifen (NOLVADEX) 20 MG tablet Take 1 tablet (20 mg total) by mouth daily. 90 tablet 3  . venlafaxine XR (EFFEXOR-XR) 37.5 MG 24 hr capsule TAKE 1 CAPSULE BY MOUTH DAILY WITH BREAKFAST. 90 capsule 0  . vitamin C (ASCORBIC ACID) 500  MG tablet Take 500 mg by mouth daily.     No current facility-administered medications for this visit.    PHYSICAL EXAMINATION: ECOG PERFORMANCE STATUS: 1 - Symptomatic but completely ambulatory  Vitals:   05/12/19 1455  BP: (!) 177/75  Pulse: 81  Resp: 17  Temp: 98.3 F (36.8 C)  SpO2: 98%   Filed Weights   05/12/19 1455  Weight: 199 lb 9.6 oz (90.5 kg)    BREAST: No palpable masses or nodules in either right or left breasts. No palpable axillary supraclavicular or infraclavicular adenopathy no breast tenderness or nipple discharge. (exam performed in the presence of a chaperone)  LABORATORY DATA:  I have reviewed the data as listed CMP Latest Ref Rng & Units 04/29/2019 03/20/2019 04/24/2018  Glucose 65 - 99 mg/dL 117(H) 172(H) 159(H)  BUN 8 - 27 mg/dL '14 19 20  '$ Creatinine 0.57 - 1.00 mg/dL 0.75 0.92 0.82  Sodium 134 - 144 mmol/L 141 138 140  Potassium 3.5 - 5.2 mmol/L 4.6 4.7 4.7  Chloride 96 - 106 mmol/L 103 101 100  CO2 20 - 29 mmol/L '23 21 21  '$ Calcium 8.7 - 10.3 mg/dL 8.9 10.4(H) 10.1  Total Protein 6.0 - 8.5 g/dL 6.0 6.7 6.7  Total Bilirubin 0.0 - 1.2 mg/dL 0.3 0.3 0.4  Alkaline Phos 39 - 117 IU/L 44 55 49  AST 0 - 40 IU/L 25 55(H) 47(H)  ALT 0 - 32 IU/L 21 31 38(H)    Lab Results  Component Value Date   WBC 10.4 03/20/2019   HGB 13.3 03/20/2019   HCT 39.8 03/20/2019   MCV 89 03/20/2019   PLT 284 03/20/2019   NEUTROABS 7.0 03/20/2019    ASSESSMENT & PLAN:  Ductal carcinoma in situ (DCIS) of left breast 05/30/17:Left Lumpectomy: DCIS Intermediate Grade with necrosis and calcs, 0.9 cm, Er: 95%, PR 95%, TisNx (Stage 0) Adj RT:07/10/2017-08/06/2017  Plan: Adj Tamoxifen 20 mg daily X 5 years started 08/07/2017 Tamoxifen toxicities: Initially she had mild hot flashes but that resolved, denies any muscle cramps  Breast cancer surveillance: 1.  Breast exam July 2020: Benign 2.  Mammogram 04/16/2018: Indeterminate 5 mm group of calcifications in the left breast  biopsy revealed fibroadenoma no evidence of malignancy. I sent an order for another mammogram today.  She will call and schedule that mammogram. She does take care of her husband who has multiple chronic health issues. 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  Return to clinic in 1 year for follow-up    No orders of the defined types were placed in this encounter.  The patient has a good understanding of the overall plan. she agrees with it. she will call with any problems that may develop before the next visit here.  Total time spent: 20 mins including face to face time and time spent for planning, charting and coordination of care  Nicholas Lose, MD 05/12/2019  I, Cloyde Reams Dorshimer, am acting as scribe for Dr. Nicholas Lose.  I have reviewed the above documentation for accuracy and completeness, and I  agree with the above.

## 2019-05-12 ENCOUNTER — Inpatient Hospital Stay: Payer: Medicare HMO | Attending: Hematology and Oncology | Admitting: Hematology and Oncology

## 2019-05-12 ENCOUNTER — Other Ambulatory Visit: Payer: Self-pay

## 2019-05-12 DIAGNOSIS — Z923 Personal history of irradiation: Secondary | ICD-10-CM | POA: Insufficient documentation

## 2019-05-12 DIAGNOSIS — D0512 Intraductal carcinoma in situ of left breast: Secondary | ICD-10-CM | POA: Diagnosis not present

## 2019-05-12 DIAGNOSIS — Z79810 Long term (current) use of selective estrogen receptor modulators (SERMs): Secondary | ICD-10-CM | POA: Insufficient documentation

## 2019-05-12 MED ORDER — TAMOXIFEN CITRATE 20 MG PO TABS: 20.0000 mg | ORAL_TABLET | Freq: Every day | ORAL | 3 refills | Status: DC

## 2019-05-12 NOTE — Assessment & Plan Note (Signed)
05/30/17:Left Lumpectomy: DCIS Intermediate Grade with necrosis and calcs, 0.9 cm, Er: 95%, PR 95%, TisNx (Stage 0) Adj RT:07/10/2017-08/06/2017  Plan: Adj Tamoxifen 20 mg daily X 5 years started 08/07/2017 Tamoxifen toxicities: Initially she had mild hot flashes but that resolved, denies any muscle cramps  Breast cancer surveillance: 1.  Breast exam July 2020: Benign 2.  Mammogram 04/16/2018: Indeterminate 5 mm group of calcifications in the left breast biopsy revealed fibroadenoma no evidence of malignancy.  Return to clinic in 1 year for follow-up

## 2019-05-13 ENCOUNTER — Telehealth: Payer: Self-pay | Admitting: Hematology and Oncology

## 2019-05-13 NOTE — Telephone Encounter (Signed)
I talk with patient regarding schedule  

## 2019-05-14 ENCOUNTER — Other Ambulatory Visit: Payer: Self-pay | Admitting: Cardiovascular Disease

## 2019-05-15 ENCOUNTER — Other Ambulatory Visit: Payer: Self-pay | Admitting: Endocrinology

## 2019-05-15 DIAGNOSIS — E1165 Type 2 diabetes mellitus with hyperglycemia: Secondary | ICD-10-CM

## 2019-05-20 ENCOUNTER — Other Ambulatory Visit: Payer: Self-pay

## 2019-05-22 ENCOUNTER — Other Ambulatory Visit: Payer: Self-pay

## 2019-05-22 ENCOUNTER — Ambulatory Visit: Payer: Medicare HMO | Admitting: Endocrinology

## 2019-05-22 ENCOUNTER — Encounter: Payer: Self-pay | Admitting: Endocrinology

## 2019-05-22 VITALS — BP 112/80 | HR 75 | Ht 66.0 in | Wt 198.8 lb

## 2019-05-22 DIAGNOSIS — E119 Type 2 diabetes mellitus without complications: Secondary | ICD-10-CM | POA: Diagnosis not present

## 2019-05-22 LAB — POCT GLYCOSYLATED HEMOGLOBIN (HGB A1C): Hemoglobin A1C: 7 % — AB (ref 4.0–5.6)

## 2019-05-22 NOTE — Progress Notes (Signed)
Subjective:    Patient ID: [name], female    DOB: Oct 16, 1945, 74 y.o.   MRN: [id number]  HPI Pt returns for f/u of diabetes mellitus: DM type: 2 Dx'ed: 7026 Complications: none Therapy: 2 oral meds GDM: 1985 DKA: never Severe hypoglycemia: never Pancreatitis: never Pancreatic imaging: never SDOH: she cannot afford brand name meds. Other: she has never been on insulin. Interval history: pt states she feels well in general.   Pt says cbg varies from 117-170.  It is in general higher as the day goes on.   Past Medical History:  Diagnosis Date  . Abnormal bone density screening   . Arthritis   . Cancer ([place]) 07/2017   left breast cancer  . Complication of anesthesia    "i wake up"  . Diverticulosis   . DM (diabetes mellitus) ([place])   . Family history of colon cancer   . Family history of ovarian cancer   . Family history of pancreatic cancer   . Family history of prostate cancer   . Family history of stomach cancer   . GERD (gastroesophageal reflux disease)   . History of radiation therapy 07/10/17- 08/06/17   40.05 Gy directed to the left breast in 15 fractions, followed by a boost of 10 Gy in 5 fractions.   Marland Kitchen HTN (hypertension)   . Hyperlipidemia   . Osteoporosis 06/15/2017  . Personal history of radiation therapy   . Poor compliance   . Vitamin D deficiency     Past Surgical History:  Procedure Laterality Date  . APPENDECTOMY    . BREAST BIOPSY Left 04/24/2017   malignant  . BREAST BIOPSY Left 04/24/2017   benign  . BREAST BIOPSY Left 04/25/2017   benign  . BREAST LUMPECTOMY Left 05/30/2017  . BREAST LUMPECTOMY WITH RADIOACTIVE SEED LOCALIZATION Left 05/30/2017   Procedure: LEFT BREAST LUMPECTOMY WITH RADIOACTIVE SEED LOCALIZATION;  Surgeon: Erroll Luna, MD;  Location: [facility];  Service: General;  Laterality: Left;  . COLONOSCOPY  06/12/2005   Schooler/normal exam="i woke up during it"  . DILATION AND CURETTAGE OF UTERUS    . FOREIGN BODY REMOVAL  ESOPHAGEAL    . TONSILLECTOMY    . TUBAL LIGATION      Social History   Socioeconomic History  . Marital status: Married    Spouse name: Not on file  . Number of children: Not on file  . Years of education: Not on file  . Highest education level: Not on file  Occupational History  . Not on file  Tobacco Use  . Smoking status: Never Smoker  . Smokeless tobacco: Never Used  Substance and Sexual Activity  . Alcohol use: No  . Drug use: No  . Sexual activity: Not Currently  Other Topics Concern  . Not on file  Social History Narrative  . Not on file   Social Determinants of Health   Financial Resource Strain:   . Difficulty of Paying Living Expenses: Not on file  Food Insecurity:   . Worried About Charity fundraiser in the Last Year: Not on file  . Ran Out of Food in the Last Year: Not on file  Transportation Needs:   . Lack of Transportation (Medical): Not on file  . Lack of Transportation (Non-Medical): Not on file  Physical Activity:   . Days of Exercise per Week: Not on file  . Minutes of Exercise per Session: Not on file  Stress:   . Feeling of Stress : Not  on file  Social Connections:   . Frequency of Communication with Friends and Family: Not on file  . Frequency of Social Gatherings with Friends and Family: Not on file  . Attends Religious Services: Not on file  . Active Member of Clubs or Organizations: Not on file  . Attends Archivist Meetings: Not on file  . Marital Status: Not on file  Intimate Partner Violence:   . Fear of Current or Ex-Partner: Not on file  . Emotionally Abused: Not on file  . Physically Abused: Not on file  . Sexually Abused: Not on file    Current Outpatient Medications on File Prior to Visit  Medication Sig Dispense Refill  . Accu-Chek Softclix Lancets lancets 1 each by Other route 2 (two) times daily. Used to check blood sugars twice daily. DX code E11.9.    Marland Kitchen amLODipine (NORVASC) 5 MG tablet Take 1 tablet (5 mg  total) by mouth daily. 90 tablet 3  . aspirin 325 MG EC tablet Take 325 mg by mouth daily.    . Blood Glucose Monitoring Suppl (ACCU-CHEK AVIVA PLUS) w/Device KIT USE AS DIRECTED 2 TIMES DAILY 1 kit 0  . calcium-vitamin D (OSCAL WITH D) 250-125 MG-UNIT tablet Take 1 tablet by mouth daily.    . carvedilol (COREG) 12.5 MG tablet TAKE 1 TABLET BY MOUTH 2 TIMES DAILY. 60 tablet 1  . glucose blood test strip Used to check blood sugars twice daily. DX code E11.9. 100 each 12  . losartan-hydrochlorothiazide (HYZAAR) 100-12.5 MG tablet TAKE 1 TABLET BY MOUTH EVERY DAY 90 tablet 0  . metFORMIN (GLUCOPHAGE) 1000 MG tablet TAKE 1 TABLET BY MOUTH TWICE A DAY WITH MEALS 60 tablet 0  . pravastatin (PRAVACHOL) 20 MG tablet Take 1 tablet (20 mg total) by mouth daily. 90 tablet 1  . PROLIA 60 MG/ML SOLN injection   0  . repaglinide (PRANDIN) 2 MG tablet Take 1 tablet (2 mg total) by mouth 2 (two) times daily before a meal. 180 tablet 3  . tamoxifen (NOLVADEX) 20 MG tablet Take 1 tablet (20 mg total) by mouth daily. 90 tablet 3  . venlafaxine XR (EFFEXOR-XR) 37.5 MG 24 hr capsule TAKE 1 CAPSULE BY MOUTH DAILY WITH BREAKFAST. 90 capsule 0  . vitamin C (ASCORBIC ACID) 500 MG tablet Take 500 mg by mouth daily.     No current facility-administered medications on file prior to visit.    Allergies  Allergen Reactions  . Atorvastatin Other (See Comments)    dizziness  . Fosamax [Alendronate Sodium]     Cough,trouble shallowing, chest pain  . Lisinopril Cough  . Tetracycline Nausea And Vomiting    Family History  Problem Relation Age of Onset  . Colon cancer Mother 64  . Ovarian cancer Mother 107       'radium treatment'  . Cardiomyopathy Mother   . Colon polyps Mother   . Prostate cancer Maternal Uncle 1       metastatic 'died from it'  . Stomach cancer Maternal Grandmother 57  . Heart disease Maternal Grandmother   . Diabetes Maternal Grandfather   . Pancreatic cancer Maternal Aunt 66  . Prostate  cancer Maternal Uncle 51       metastatic/ 'died from it'  . Cervical cancer Daughter   . Thyroid cancer Paternal Uncle   . Thyroid cancer Cousin   . Esophageal cancer Neg Hx   . Rectal cancer Neg Hx     BP 112/80 (BP Location: Left  Arm, Patient Position: Sitting, Cuff Size: Large)   Pulse 75   Ht 5' 6" (1.676 m)   Wt 198 lb 12.8 oz (90.2 kg)   SpO2 99%   BMI 32.09 kg/m    Review of Systems She denies hypoglycemia    Objective:   Physical Exam VITAL SIGNS:  See vs page GENERAL: no distress Pulses: dorsalis pedis intact bilat.   MSK: no deformity of the feet CV: 1+ bilat leg edema, and bilat vv's Skin:  no ulcer on the feet.  normal color and temp on the feet. Neuro: sensation is intact to touch on the feet Ext: there is bilateral onychomycosis of the toenails  Lab Results  Component Value Date   HGBA1C 7.0 (A) 05/22/2019   Lab Results  Component Value Date   CREATININE 0.75 04/29/2019   BUN 14 04/29/2019   NA 141 04/29/2019   K 4.6 04/29/2019   CL 103 04/29/2019   CO2 23 04/29/2019        Assessment & Plan:  Type 2 DM: well-controlled   Patient Instructions  Please continue the same medications for diabetes.  check your blood sugar once a day.  vary the time of day when you check, between before the 3 meals, and at bedtime.  also check if you have symptoms of your blood sugar being too high or too low.  please keep a record of the readings and bring it to your next appointment here (or you can bring the meter itself).  You can write it on any piece of paper.  please call us sooner if your blood sugar goes below 70, or if you have a lot of readings over 200.   Please come back for a follow-up appointment in 4 months.

## 2019-05-22 NOTE — Patient Instructions (Addendum)
Please continue the same medications for diabetes.  check your blood sugar once a day.  vary the time of day when you check, between before the 3 meals, and at bedtime.  also check if you have symptoms of your blood sugar being too high or too low.  please keep a record of the readings and bring it to your next appointment here (or you can bring the meter itself).  You can write it on any piece of paper.  please call us sooner if your blood sugar goes below 70, or if you have a lot of readings over 200.   Please come back for a follow-up appointment in 4 months.

## 2019-05-27 ENCOUNTER — Other Ambulatory Visit: Payer: Self-pay

## 2019-05-27 ENCOUNTER — Ambulatory Visit (INDEPENDENT_AMBULATORY_CARE_PROVIDER_SITE_OTHER): Payer: Medicare HMO | Admitting: Pharmacist

## 2019-05-27 ENCOUNTER — Encounter: Payer: Self-pay | Admitting: Pharmacist

## 2019-05-27 VITALS — BP 164/78 | HR 72 | Ht 65.0 in | Wt 202.0 lb

## 2019-05-27 DIAGNOSIS — I10 Essential (primary) hypertension: Secondary | ICD-10-CM

## 2019-05-27 MED ORDER — CARVEDILOL 25 MG PO TABS: 25.0000 mg | ORAL_TABLET | Freq: Two times a day (BID) | ORAL | 3 refills | Status: DC

## 2019-05-27 MED ORDER — AMLODIPINE BESYLATE 10 MG PO TABS: 10.0000 mg | ORAL_TABLET | Freq: Every day | ORAL | 3 refills | Status: DC

## 2019-05-27 NOTE — Patient Instructions (Addendum)
Return for a follow up appointment in 8 weeks  Check your blood pressure at home daily (if able) and keep record of the readings.  Take your BP meds as follows: *INCREASE amlodipine dose to 10mg  daily*  Bring all of your meds, your BP cuff and your record of home blood pressures to your next appointment.  Exercise as you're able, try to walk approximately 30 minutes per day.  Keep salt intake to a minimum, especially watch canned and prepared boxed foods.  Eat more fresh fruits and vegetables and fewer canned items.  Avoid eating in fast food restaurants.    HOW TO TAKE YOUR BLOOD PRESSURE: . Rest 5 minutes before taking your blood pressure. .  Don't smoke or drink caffeinated beverages for at least 30 minutes before. . Take your blood pressure before (not after) you eat. . Sit comfortably with your back supported and both feet on the floor (don't cross your legs). . Elevate your arm to heart level on a table or a desk. . Use the proper sized cuff. It should fit smoothly and snugly around your bare upper arm. There should be enough room to slip a fingertip under the cuff. The bottom edge of the cuff should be 1 inch above the crease of the elbow. . Ideally, take 3 measurements at one sitting and record the average.

## 2019-05-27 NOTE — Progress Notes (Signed)
Patient ID: [name]                 DOB: 20-May-1945                      MRN: [id number]     HPI:  [name] is a 74 y.o. female referred by Dr. Gwenlyn Found to HTN clinic. PMH includes uncontrolled hypertension, hyperlipidemia, depression, hx of cancer, DM-II, and GERD. Increased stress in life, she is her husband's caregiver, and still works as Patent examiner. Denies dizziness, falls, lightheadedness or blurred vision. Reports some mild shortness of breath this morning, not recurrent, and patient attributed to not resting well.   Current HTN meds:  amlodipine 32m daily at bedtime losartan/HCTZ 100/12.51mdaily at 7am carvedilol 2544mwice daily (12.5 mg x 2 tablets twice daily)  Intolerance:  Lisinopril - cough  BP goal: <130/80  Family History: HTN in mother  Social History: denies tobacco us [place] alcohol  Diet: mainly home cooked meals, avoid canned products, drinks coffee in the morning (decreased to 2 cups from 4-5 cups daily).   Exercise: activities of daily living; trying to walk more as weather permits  Home BP readings:  Fitreno Arm cuff - sadly, not accurate within 10 mmHg; patient instructed to check twice daily and report average  Provided 12 readings in the last 4 weeks; 9 AM with average of 171/82  and 3 PM with average of 166/77 One outlier of 204/91 attributed to increased stress from death of patients brother   Wt Readings from Last 3 Encounters:  05/27/19 202 lb (91.6 kg)  05/22/19 198 lb 12.8 oz (90.2 kg)  05/12/19 199 lb 9.6 oz (90.5 kg)   BP Readings from Last 3 Encounters:  05/27/19 (!) 164/78  05/22/19 112/80  05/12/19 (!) 177/75   Pulse Readings from Last 3 Encounters:  05/27/19 72  05/22/19 75  05/12/19 81    Past Medical History:  Diagnosis Date  . Abnormal bone density screening   . Arthritis   . Cancer ([facility]CC[facility]io4/2019   left breast cancer  . Complication of anesthesia    "i wake up"  . Diverticulosis   . DM  (diabetes mellitus) ([facility]CC[facility] . Family history of colon cancer   . Family history of ovarian cancer   . Family history of pancreatic cancer   . Family history of prostate cancer   . Family history of stomach cancer   . GERD (gastroesophageal reflux disease)   . History of radiation therapy 07/10/17- 08/06/17   40.05 Gy directed to the left breast in 15 fractions, followed by a boost of 10 Gy in 5 fractions.   . HMarland KitchenN (hypertension)   . Hyperlipidemia   . Osteoporosis 06/15/2017  . Personal history of radiation therapy   . Poor compliance   . Vitamin D deficiency     Current Outpatient Medications on File Prior to Visit  Medication Sig Dispense Refill  . Accu-Chek Softclix Lancets lancets 1 each by Other route 2 (two) times daily. Used to check blood sugars twice daily. DX code E11.9.    . aMarland Kitchenpirin EC 81 MG tablet Take 81 mg by mouth daily.    . Blood Glucose Monitoring Suppl (ACCU-CHEK AVIVA PLUS) w/Device KIT USE AS DIRECTED 2 TIMES DAILY 1 kit 0  . calcium-vitamin D (OSCAL WITH D) 250-125 MG-UNIT tablet Take 1 tablet by mouth daily.    . gMarland Kitchenucose blood test strip Used to  check blood sugars twice daily. DX code E11.9. 100 each 12  . losartan-hydrochlorothiazide (HYZAAR) 100-12.5 MG tablet TAKE 1 TABLET BY MOUTH EVERY DAY 90 tablet 0  . metFORMIN (GLUCOPHAGE) 1000 MG tablet TAKE 1 TABLET BY MOUTH TWICE A DAY WITH MEALS 60 tablet 0  . pravastatin (PRAVACHOL) 20 MG tablet Take 1 tablet (20 mg total) by mouth daily. 90 tablet 1  . PROLIA 60 MG/ML SOLN injection   0  . repaglinide (PRANDIN) 2 MG tablet Take 1 tablet (2 mg total) by mouth 2 (two) times daily before a meal. 180 tablet 3  . tamoxifen (NOLVADEX) 20 MG tablet Take 1 tablet (20 mg total) by mouth daily. 90 tablet 3  . venlafaxine XR (EFFEXOR-XR) 37.5 MG 24 hr capsule TAKE 1 CAPSULE BY MOUTH DAILY WITH BREAKFAST. 90 capsule 0  . vitamin C (ASCORBIC ACID) 500 MG tablet Take 500 mg by mouth daily.     No current facility-administered  medications on file prior to visit.    Allergies  Allergen Reactions  . Atorvastatin Other (See Comments)    dizziness  . Fosamax [Alendronate Sodium]     Cough,trouble shallowing, chest pain  . Lisinopril Cough  . Tetracycline Nausea And Vomiting    Blood pressure (!) 164/78, pulse 72, height '5\' 5"'  (1.651 m), weight 202 lb (91.6 kg), SpO2 98 %.  Essential hypertension:  Blood pressure remains above goal of <130/80. Patient still denies any symptoms and is adherent to medications. Instructed to continue with current diet, continue avoiding salty foods, and increase physical activity as weather and time permits. Continue with carvedilol 25 mg twice daily. Sending in new prescription for 25 mg tablets to reduce tablet burden. Increase amlodipine to 10 mg daily at bedtime. Patient may need a more accurate BP monitor; due to cost, will have patient check BP twice daily and record the average and bring records to next follow up visit in May. If blood pressure remains elevated at next visit, plan to switch losartan/hctz to valsartan/hctz or better control or transition to chlorthalidone, if cost permits.   Note written by: Jonathon Bellows, PharmD Student  [name] PharmD, BCPS, [place] [address] [place] [address] 05/27/2019 9:19 AM

## 2019-06-03 ENCOUNTER — Telehealth: Payer: Self-pay | Admitting: Cardiology

## 2019-06-03 ENCOUNTER — Other Ambulatory Visit: Payer: Self-pay

## 2019-06-03 ENCOUNTER — Other Ambulatory Visit: Payer: Self-pay | Admitting: Family Medicine

## 2019-06-03 DIAGNOSIS — I10 Essential (primary) hypertension: Secondary | ICD-10-CM

## 2019-06-03 MED ORDER — LOSARTAN POTASSIUM-HCTZ 100-12.5 MG PO TABS: 1.0000 | ORAL_TABLET | Freq: Every day | ORAL | 0 refills | Status: DC

## 2019-06-03 NOTE — Telephone Encounter (Signed)
RX sent to patient pharmacy.

## 2019-06-03 NOTE — Telephone Encounter (Signed)
Does this need to come from cardiology? 

## 2019-06-03 NOTE — Telephone Encounter (Signed)
Pt c/o medication issue:  1. Name of Medication: losartan-hydrochlorothiazide (HYZAAR) 100-12.5 MG tablet  2. How are you currently taking this medication (dosage and times per day)? 1 tablet by tablet by mouth daily   3. Are you having a reaction (difficulty breathing--STAT)? No   4. What is your medication issue? Patient is calling stating her PCP stated she would need to begin having losartan prescribed through our office when she called to have it refilled through them. [facility] only has 4 tablets left and is in need of a refill now. Please advise.

## 2019-06-03 NOTE — Telephone Encounter (Signed)
Yes, needs cardiology to refill. Thanks.

## 2019-06-03 NOTE — Telephone Encounter (Signed)
Pt was notified to contact caridology

## 2019-06-06 ENCOUNTER — Other Ambulatory Visit: Payer: Self-pay | Admitting: Cardiovascular Disease

## 2019-06-10 ENCOUNTER — Other Ambulatory Visit: Payer: Self-pay | Admitting: Endocrinology

## 2019-06-10 DIAGNOSIS — E1165 Type 2 diabetes mellitus with hyperglycemia: Secondary | ICD-10-CM

## 2019-06-13 ENCOUNTER — Other Ambulatory Visit: Payer: Self-pay

## 2019-06-13 ENCOUNTER — Ambulatory Visit
Admission: RE | Admit: 2019-06-13 | Discharge: 2019-06-13 | Disposition: A | Discharge status: Home / Self Care | Payer: Medicare HMO | Source: Ambulatory Visit | Attending: Hematology and Oncology | Admitting: Hematology and Oncology

## 2019-06-13 DIAGNOSIS — R922 Inconclusive mammogram: Secondary | ICD-10-CM | POA: Diagnosis not present

## 2019-06-13 DIAGNOSIS — D0512 Intraductal carcinoma in situ of left breast: Secondary | ICD-10-CM

## 2019-06-23 ENCOUNTER — Telehealth: Payer: Self-pay | Admitting: Family Medicine

## 2019-06-23 NOTE — Telephone Encounter (Signed)
Records received from [facility].

## 2019-06-24 ENCOUNTER — Telehealth: Payer: Self-pay | Admitting: Family Medicine

## 2019-06-24 NOTE — Telephone Encounter (Signed)
Left message for pt to call me back. Need to speak to pt concerning [name].

## 2019-06-25 ENCOUNTER — Encounter: Payer: Self-pay | Admitting: Internal Medicine

## 2019-07-05 ENCOUNTER — Other Ambulatory Visit: Payer: Self-pay | Admitting: Endocrinology

## 2019-07-05 DIAGNOSIS — E1165 Type 2 diabetes mellitus with hyperglycemia: Secondary | ICD-10-CM

## 2019-07-14 ENCOUNTER — Other Ambulatory Visit: Payer: Self-pay | Admitting: Family Medicine

## 2019-07-14 DIAGNOSIS — F419 Anxiety disorder, unspecified: Secondary | ICD-10-CM

## 2019-07-14 NOTE — Telephone Encounter (Signed)
Has upcoming appt °

## 2019-07-21 ENCOUNTER — Telehealth: Payer: Self-pay | Admitting: Family Medicine

## 2019-07-21 MED FILL — PROLIA 60 MG/ML SOLN: 60 | 180 days supply | Qty: 1 | Fill #0

## 2019-07-21 NOTE — Telephone Encounter (Signed)
Left message for pt to call. Need to speak to her concerning prolia.pt called back and prolia is scheduled.

## 2019-07-27 NOTE — Progress Notes (Signed)
[name] is a 74 y.o. female who presents for annual wellness visit, CPE and follow-up on chronic medical conditions.  She has the following concerns:  She did not tell the front office she was having URI symptoms but after several minutes it was obvious that she was not well. She admitted that she was not honest about her symptoms with my staff. Complains of a 3 day history of acute onset of rhinorrhea, nasal congestion, post nasal drainage, cough and chest congestion. No fever, chills, chest pain, palpitations, N/V/D.  No known Covid exposure. 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 She has not had Covid vaccines.   States for the past 5 years she has not been able to to "walk in a straight line". reports having balance issues. Has never brought this up to any of her providers before.  No dizziness. No falls. States she often has to balance herself by reaching for the wall or furniture to steady herself. Would like to get this worked up.   Osteoporosis- she is on Prolia. Brought in her medication today for her injection. Doing well and no side effects.  Due for bone density.  She thinks she gets plenty of calcium. Is taking a calcium and vitamin D supplement.  Walking for exercise.   Diabetes- last A1C was 7.0% per patient. Managed by Dr. Loanne Drilling  Anxiety and depression- states her mood is "good". Medication is helping her.  Taking Effexor.   HL- taking pravastatin daily and no concerns.     Immunization History  Administered Date(s) Administered  . Fluad Quad(high Dose 65+) 03/20/2019  . Influenza, High Dose Seasonal PF 01/07/2018  . Pneumococcal Conjugate-13 02/05/2017  . Pneumococcal Polysaccharide-23 04/24/2018   Last Pap smear: 1989 and refuses  Last mammogram: March 2021 Last colonoscopy: 2019 Last DEXA: 2019 Dentist:  [facility].- has uppers but none on lower.  Ophtho: does not currently have an eye doctor  Exercise: walks 5 days a week   Other  doctors caring for patient include: Dr. Gwenlyn Found- cardiology Dr. Loanne Drilling- endocrinology  HTN clinic  Dr. Lindi Adie- oncology    Depression screen:  See questionnaire below.  Depression screen [facility] 2/9 07/28/2019 04/29/2019 04/24/2018 09/07/2017 06/19/2017  Decreased Interest 0 0 3 0 0  Down, Depressed, Hopeless 0 0 3 0 0  PHQ - 2 Score 0 0 6 0 0    Fall Risk Screen: see questionnaire below. Fall Risk  07/28/2019 04/29/2019 04/24/2018 09/07/2017 06/19/2017  Falls in the past year? 0 0 0 No No  Number falls in past yr: 0 0 0 - -  Injury with Fall? 0 0 0 - -    ADL screen:  See questionnaire below Functional Status Survey: Is the patient deaf or have difficulty hearing?: No Does the patient have difficulty seeing, even when wearing glasses/contacts?: No Does the patient have difficulty walking or climbing stairs?: No Does the patient have difficulty dressing or bathing?: No Does the patient have difficulty doing errands alone such as visiting a doctor's office or shopping?: No   End of Life Discussion:  Patient does not have a living will and medical power of attorney. Her daughter [name] would make her medical decisions.   Review of Systems Constitutional: -fever, -chills, -sweats, -unexpected weight change, -anorexia, -fatigue Allergy: -sneezing, -itching, +congestion Dermatology: denies changing moles, rash, lumps, new worrisome lesions ENT: +runny nose, +nasal congestion, -ear pain, -sore throat, -hoarseness, -sinus pain, -teeth pain, -tinnitus, -hearing loss, -epistaxis Cardiology:  -chest pain, -  palpitations, -edema, -orthopnea, -paroxysmal nocturnal dyspnea Respiratory: +cough, -shortness of breath, -dyspnea on exertion, +wheezing, -hemoptysis Gastroenterology: -abdominal pain, -nausea, -vomiting, -diarrhea, -constipation, -blood in stool, -changes in bowel movement, -dysphagia Hematology: -bleeding or bruising problems Musculoskeletal: -arthralgias, -myalgias, -joint swelling, -back  pain, -neck pain, -cramping, +gait changes Ophthalmology: -vision changes, -eye redness, -itching, -discharge Urology: -dysuria, -difficulty urinating, -hematuria, -urinary frequency, -urgency, incontinence Neurology: -headache, -weakness, -tingling, -numbness, -speech abnormality, -memory loss, -falls, -dizziness Psychology:  -depressed mood, -agitation, -sleep problems    PHYSICAL EXAM:  BP 120/70   Pulse 83   Temp 97.7 F (36.5 C)   Ht 5' 5.5" (1.664 m)   Wt 204 lb 12.8 oz (92.9 kg)   BMI 33.56 kg/m   General Appearance: Alert, cooperative, no distress, appears stated age Head: Normocephalic, without obvious abnormality, atraumatic Eyes: PERRL, conjunctiva/corneas clear, EOM's intact Ears: Normal TM's and external ear canals Nose: mask in place  Throat: mask in place  Neck: Supple, no lymphadenopathy; thyroid: no enlargement/tenderness/nodules Back:  ROM normal Lungs: bilateral expiratory wheezes, no rales or ronchi; respirations unlabored Chest Wall: No tenderness or deformity Heart: Regular rate and rhythm Breast Exam: declines  Abdomen: Soft, non-tender, nondistended, normoactive bowel sounds, no masses, no hepatosplenomegaly Genitalia: declines  Extremities: No clubbing, cyanosis or edema Pulses: 2+ and symmetric all extremities Skin: Skin color, texture, turgor normal, no rashes or lesions Lymph nodes: Cervical and supraclavicular nodes normal Neurologic: CNII-XII intact, normal strength, sensation and gait normal Psych: Normal mood, affect, hygiene and grooming.  ASSESSMENT/PLAN: Medicare annual wellness visit, subsequent -Here today for her Medicare annual wellness.  Denies any falls, issues with depression or difficulties with ADLs.  No concerns with memory.  Advanced directives discussed.  MOST form reviewed and signed  Routine general medical examination at a health care facility - Plan: CBC with Differential/Platelet -Reviewed preventive health care.   Counseling on healthy lifestyle including diet and exercise.  Immunizations reviewed.  Discussed safety.  Age-related osteoporosis without current pathological fracture - Plan: denosumab (PROLIA) injection 60 mg, DG Bone Density -Discussed importance of getting adequate calcium and vitamin D as well as weightbearing exercises.  She received her Prolia injection today.  She is doing well with this treatment.  She will call and schedule her bone density test.  Vitamin D deficiency - Plan: VITAMIN D 25 Hydroxy (Vit-D Deficiency, Fractures) -Check vitamin D level and follow-up  Depression, unspecified depression type -Appears to be doing well on Effexor.  Anxiety -Taking Effexor.  Reports her mood is fine  Essential hypertension -Blood pressure well controlled.  She is followed by the hypertension clinic and cardiology.  Mixed hyperlipidemia - Plan: Lipid panel -Reports taking pravastatin daily without any issues.  I will check a lipid panel and adjust dose as appropriate.  Controlled type 2 diabetes mellitus without complication, without long-term current use of insulin ([place]) -Managed by endocrinology.   Balance problem - Plan: Ambulatory referral to Physical Therapy -Reportedly has a 5-year history of balance issues.  She would like to pursue an evaluation and treatment for this.  I will refer her to physical therapy.  She will need to hold off from starting PT until her Covid testing is back and she is well again.  Upper respiratory tract infection, unspecified type - Plan: DG Chest 2 View, albuterol (VENTOLIN HFA) 108 (90 Base) MCG/ACT inhaler -She is not in any acute distress.  Covid testing advised.  Chest x-ray ordered. 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  Wheezing on expiration - Plan: DG Chest 2 View, albuterol (VENTOLIN HFA) 108 (90 Base) MCG/ACT inhaler -I will send her for a chest x-ray.  Albuterol prescribed.  Covid testing advised.   She will follow-up once we have the results  Estrogen deficiency - Plan: DG Bone Density -Being treated with Prolia for osteoporosis.  She is due for repeat bone density.     Discussed monthly self breast exams and yearly mammograms; at least 30 minutes of aerobic activity at least 5 days/week and weight-bearing exercise 2x/week; proper sunscreen use reviewed; healthy diet, including goals of calcium and vitamin D intake and alcohol recommendations (less than or equal to 1 drink/day) reviewed; regular seatbelt use; changing batteries in smoke detectors.  Immunization recommendations discussed.  Colonoscopy recommendations reviewed   Medicare Attestation I have personally reviewed: The patient's medical and social history Their use of alcohol, tobacco or illicit drugs Their current medications and supplements The patient's functional ability including ADLs,fall risks, home safety risks, cognitive, and hearing and visual impairment Diet and physical activities Evidence for depression or mood disorders  The patient's weight, height, and BMI have been recorded in the chart.  I have made referrals, counseling, and provided education to the patient based on review of the above and I have provided the patient with a written personalized care plan for preventive services.     [name], NP-C   07/28/2019

## 2019-07-27 NOTE — Patient Instructions (Signed)
  [name] , Thank you for taking time to come for your Medicare Wellness Visit. I appreciate your ongoing commitment to your health goals. Please review the following plan we discussed and let me know if I can assist you in the future.   These are the goals we discussed: Goals    . Blood Pressure < 130/80       This is a list of the screening recommended for you and due dates:  Health Maintenance  Topic Date Due  . COVID-19 Vaccine (1) Never done  . Tetanus Vaccine  Never done  . Complete foot exam   12/26/2018  . Eye exam for diabetics  03/03/2019  . Flu Shot  11/09/2019  . Hemoglobin A1C  11/19/2019  . Mammogram  06/12/2021  . Colon Cancer Screening  01/05/2028  . DEXA scan (bone density measurement)  Completed  .  Hepatitis C: One time screening is recommended by Center for Disease Control  (CDC) for  adults born from [birth year] through 1963.   Completed  . Pneumonia vaccines  Completed

## 2019-07-28 ENCOUNTER — Ambulatory Visit (INDEPENDENT_AMBULATORY_CARE_PROVIDER_SITE_OTHER): Payer: Medicare HMO | Admitting: Family Medicine

## 2019-07-28 ENCOUNTER — Ambulatory Visit: Payer: Medicare HMO

## 2019-07-28 ENCOUNTER — Encounter: Payer: Self-pay | Admitting: Family Medicine

## 2019-07-28 ENCOUNTER — Ambulatory Visit: Payer: Medicare HMO | Attending: Internal Medicine

## 2019-07-28 ENCOUNTER — Ambulatory Visit
Admission: RE | Admit: 2019-07-28 | Discharge: 2019-07-28 | Disposition: A | Discharge status: Home / Self Care | Payer: Medicare HMO | Source: Ambulatory Visit | Attending: Family Medicine | Admitting: Family Medicine

## 2019-07-28 ENCOUNTER — Other Ambulatory Visit: Payer: Self-pay

## 2019-07-28 VITALS — BP 120/70 | HR 83 | Temp 97.7°F | Ht 65.5 in | Wt 204.8 lb

## 2019-07-28 DIAGNOSIS — M810 Age-related osteoporosis without current pathological fracture: Secondary | ICD-10-CM | POA: Diagnosis not present

## 2019-07-28 DIAGNOSIS — Z0000 Encounter for general adult medical examination without abnormal findings: Secondary | ICD-10-CM

## 2019-07-28 DIAGNOSIS — Z20822 Contact with and (suspected) exposure to covid-19: Secondary | ICD-10-CM | POA: Diagnosis not present

## 2019-07-28 DIAGNOSIS — E782 Mixed hyperlipidemia: Secondary | ICD-10-CM | POA: Diagnosis not present

## 2019-07-28 DIAGNOSIS — E559 Vitamin D deficiency, unspecified: Secondary | ICD-10-CM

## 2019-07-28 DIAGNOSIS — E119 Type 2 diabetes mellitus without complications: Secondary | ICD-10-CM

## 2019-07-28 DIAGNOSIS — R062 Wheezing: Secondary | ICD-10-CM

## 2019-07-28 DIAGNOSIS — J069 Acute upper respiratory infection, unspecified: Secondary | ICD-10-CM

## 2019-07-28 DIAGNOSIS — I10 Essential (primary) hypertension: Secondary | ICD-10-CM | POA: Diagnosis not present

## 2019-07-28 DIAGNOSIS — R2689 Other abnormalities of gait and mobility: Secondary | ICD-10-CM | POA: Insufficient documentation

## 2019-07-28 DIAGNOSIS — R05 Cough: Secondary | ICD-10-CM | POA: Diagnosis not present

## 2019-07-28 DIAGNOSIS — F329 Major depressive disorder, single episode, unspecified: Secondary | ICD-10-CM | POA: Diagnosis not present

## 2019-07-28 DIAGNOSIS — E2839 Other primary ovarian failure: Secondary | ICD-10-CM

## 2019-07-28 DIAGNOSIS — F419 Anxiety disorder, unspecified: Secondary | ICD-10-CM

## 2019-07-28 DIAGNOSIS — C50412 Malignant neoplasm of upper-outer quadrant of left female breast: Secondary | ICD-10-CM

## 2019-07-28 DIAGNOSIS — F32A Depression, unspecified: Secondary | ICD-10-CM

## 2019-07-28 DIAGNOSIS — R0602 Shortness of breath: Secondary | ICD-10-CM | POA: Diagnosis not present

## 2019-07-28 LAB — CBC WITH DIFFERENTIAL/PLATELET
Basophils Absolute: 0.1 10*3/uL (ref 0.0–0.2)
Basos: 1 %
EOS (ABSOLUTE): 0.4 10*3/uL (ref 0.0–0.4)
Eos: 4 %
Hematocrit: 37.5 % (ref 34.0–46.6)
Hemoglobin: 12.2 g/dL (ref 11.1–15.9)
Immature Grans (Abs): 0 10*3/uL (ref 0.0–0.1)
Immature Granulocytes: 0 %
Lymphocytes Absolute: 1.1 10*3/uL (ref 0.7–3.1)
Lymphs: 12 %
MCH: 29.1 pg (ref 26.6–33.0)
MCHC: 32.5 g/dL (ref 31.5–35.7)
MCV: 90 fL (ref 79–97)
Monocytes Absolute: 1.1 10*3/uL — ABNORMAL HIGH (ref 0.1–0.9)
Monocytes: 13 %
Neutrophils Absolute: 6.3 10*3/uL (ref 1.4–7.0)
Neutrophils: 70 %
Platelets: 232 10*3/uL (ref 150–450)
RBC: 4.19 x10E6/uL (ref 3.77–5.28)
RDW: 12.9 % (ref 11.7–15.4)
WBC: 9 10*3/uL (ref 3.4–10.8)

## 2019-07-28 MED ORDER — ALBUTEROL SULFATE HFA 108 (90 BASE) MCG/ACT IN AERS: 2.0000 | INHALATION_SPRAY | Freq: Four times a day (QID) | RESPIRATORY_TRACT | 0 refills | Status: DC | PRN

## 2019-07-28 MED ORDER — DENOSUMAB 60 MG/ML ~~LOC~~ SOSY
60.0000 mg | PREFILLED_SYRINGE | Freq: Once | SUBCUTANEOUS | Status: AC
  Administered 2019-07-28: 60 mg via SUBCUTANEOUS

## 2019-07-29 ENCOUNTER — Other Ambulatory Visit: Payer: Self-pay | Admitting: Family Medicine

## 2019-07-29 ENCOUNTER — Other Ambulatory Visit: Payer: Self-pay | Admitting: Endocrinology

## 2019-07-29 DIAGNOSIS — E1165 Type 2 diabetes mellitus with hyperglycemia: Secondary | ICD-10-CM

## 2019-07-29 LAB — LIPID PANEL
Chol/HDL Ratio: 3.9 ratio (ref 0.0–4.4)
Cholesterol, Total: 151 mg/dL (ref 100–199)
HDL: 39 mg/dL — ABNORMAL LOW (ref >39)
LDL Chol Calc (NIH): 82 mg/dL (ref 0–99)
Triglycerides: 177 mg/dL — ABNORMAL HIGH (ref 0–149)
VLDL Cholesterol Cal: 30 mg/dL (ref 5–40)

## 2019-07-29 LAB — NOVEL CORONAVIRUS, NAA: SARS-CoV-2, NAA: NOT DETECTED

## 2019-07-29 LAB — VITAMIN D 25 HYDROXY (VIT D DEFICIENCY, FRACTURES): Vit D, 25-Hydroxy: 28.5 ng/mL — ABNORMAL LOW (ref 30.0–100.0)

## 2019-07-29 LAB — SARS-COV-2, NAA 2 DAY TAT

## 2019-07-29 MED ORDER — PRAVASTATIN SODIUM 20 MG PO TABS: 20.0000 mg | ORAL_TABLET | Freq: Every day | ORAL | 1 refills | Status: DC

## 2019-07-30 ENCOUNTER — Encounter: Payer: Self-pay | Admitting: Family Medicine

## 2019-08-04 DIAGNOSIS — R2689 Other abnormalities of gait and mobility: Secondary | ICD-10-CM | POA: Diagnosis not present

## 2019-08-04 DIAGNOSIS — R262 Difficulty in walking, not elsewhere classified: Secondary | ICD-10-CM | POA: Diagnosis not present

## 2019-08-04 DIAGNOSIS — M6281 Muscle weakness (generalized): Secondary | ICD-10-CM | POA: Diagnosis not present

## 2019-08-04 DIAGNOSIS — R2681 Unsteadiness on feet: Secondary | ICD-10-CM | POA: Diagnosis not present

## 2019-08-05 ENCOUNTER — Other Ambulatory Visit: Payer: Self-pay | Admitting: Family Medicine

## 2019-08-05 DIAGNOSIS — F419 Anxiety disorder, unspecified: Secondary | ICD-10-CM

## 2019-08-05 NOTE — Telephone Encounter (Signed)
Ok per vickie

## 2019-08-11 DIAGNOSIS — R2689 Other abnormalities of gait and mobility: Secondary | ICD-10-CM | POA: Diagnosis not present

## 2019-08-11 DIAGNOSIS — M6281 Muscle weakness (generalized): Secondary | ICD-10-CM | POA: Diagnosis not present

## 2019-08-11 DIAGNOSIS — R262 Difficulty in walking, not elsewhere classified: Secondary | ICD-10-CM | POA: Diagnosis not present

## 2019-08-11 DIAGNOSIS — R2681 Unsteadiness on feet: Secondary | ICD-10-CM | POA: Diagnosis not present

## 2019-08-14 DIAGNOSIS — R262 Difficulty in walking, not elsewhere classified: Secondary | ICD-10-CM | POA: Diagnosis not present

## 2019-08-14 DIAGNOSIS — R2681 Unsteadiness on feet: Secondary | ICD-10-CM | POA: Diagnosis not present

## 2019-08-14 DIAGNOSIS — R2689 Other abnormalities of gait and mobility: Secondary | ICD-10-CM | POA: Diagnosis not present

## 2019-08-14 DIAGNOSIS — M6281 Muscle weakness (generalized): Secondary | ICD-10-CM | POA: Diagnosis not present

## 2019-08-15 ENCOUNTER — Other Ambulatory Visit: Payer: Self-pay | Admitting: Family Medicine

## 2019-08-15 DIAGNOSIS — J069 Acute upper respiratory infection, unspecified: Secondary | ICD-10-CM

## 2019-08-15 DIAGNOSIS — R062 Wheezing: Secondary | ICD-10-CM

## 2019-08-19 ENCOUNTER — Encounter: Payer: Self-pay | Admitting: Pharmacist Clinician (PhC)/ Clinical Pharmacy Specialist

## 2019-08-19 ENCOUNTER — Other Ambulatory Visit: Payer: Self-pay

## 2019-08-19 ENCOUNTER — Other Ambulatory Visit: Payer: Self-pay | Admitting: Cardiovascular Disease

## 2019-08-19 ENCOUNTER — Ambulatory Visit: Payer: Medicare HMO | Admitting: Pharmacist Clinician (PhC)/ Clinical Pharmacy Specialist

## 2019-08-19 DIAGNOSIS — I10 Essential (primary) hypertension: Secondary | ICD-10-CM

## 2019-08-19 NOTE — Progress Notes (Signed)
Patient ID: [name]                 DOB: [birth year]                      MRN: [id number]     HPI:  [name] is a 74 y.o. female referred by Dr. Gwenlyn Found to HTN clinic. PMH includes uncontrolled hypertension, hyperlipidemia, depression, hx of cancer, DM-II, and GERD. Increased stress in life, she is her husband's caregiver, and still works as Patent examiner.    She was last seen in the office in February.  At that time her BP was 164/78 and her amlodipine was increased from 5 to 10 mg daily.  She has noticed improvement in home readings since then.  No problems with chest pain, shortness of breath, dizziness/lightheadedness or lower extremity edema.  She does note the occasional orthostatic hypotension, usually in the late mornings if she gets up too quickly.  Past Medical History: Hyperlipidemia/ 4/21 TC 151, TG 177, HDL 36, LDL 82  - on pravastatin 20 mg qd  DM2:  2/21 A1c 7.0 - on metformin 1 gm bid, repaglinide 2 mg bid  (A1c down from 8.0 three months earlier)  Depression on venlafaxine  Hx of cancer Ductal carcinoma of the left breast- on tamoxifen since 07/2017   Current HTN meds:  amlodipine 10 mg daily at 7 am losartan/HCTZ 100/12.5 mg daily at 7am carvedilol 25 mg twice daily   Intolerance:   Lisinopril - cough  BP goal: <130/80  Family History: HTN in mother  Social History: denies tobacco [place] or alcohol; 4 cups of coffee daily  Diet: mainly home cooked meals, avoid canned products, water or coffee only  Exercise: activities of daily living; trying to walk more as weather permits, some walking, although no sidewalks in subdivision, so tries to walk when less traffic  Home BP readings:  [place] Arm cuff - sadly, not accurate within 10 mmHg (read higher than office per patient);   April:  AM systolic average 850 (28); PM systolic average 277 (27) - All diastolic readings WNL  May:  AM systolic average 412 (9); PM average 126 (8) - All diastolic  readings WNL  Labs: 1/21:  Na 141,  4.6, Glu 117, BUN 14, SCr 0.75, GFR 80  Wt Readings from Last 3 Encounters:  08/19/19 189 lb (85.7 kg)  07/28/19 204 lb 12.8 oz (92.9 kg)  05/27/19 202 lb (91.6 kg)   BP Readings from Last 3 Encounters:  08/19/19 130/62  07/28/19 120/70  05/27/19 (!) 164/78   Pulse Readings from Last 3 Encounters:  08/19/19 66  07/28/19 83  05/27/19 72    Past Medical History:  Diagnosis Date  . Abnormal bone density screening   . Arthritis   . Cancer ([place]) 07/2017   left breast cancer  . Complication of anesthesia    "i wake up"  . Diverticulosis   . DM (diabetes mellitus) ([place])   . Family history of colon cancer   . Family history of ovarian cancer   . Family history of pancreatic cancer   . Family history of prostate cancer   . Family history of stomach cancer   . GERD (gastroesophageal reflux disease)   . History of radiation therapy 07/10/17- 08/06/17   40.05 Gy directed to the left breast in 15 fractions, followed by a boost of 10 Gy in 5 fractions.   Marland Kitchen HTN (hypertension)   .  Hyperlipidemia   . Osteoporosis 06/15/2017  . Personal history of radiation therapy   . Poor compliance   . Vitamin D deficiency     Current Outpatient Medications on File Prior to Visit  Medication Sig Dispense Refill  . Accu-Chek Softclix Lancets lancets 1 each by Other route 2 (two) times daily. Used to check blood sugars twice daily. DX code E11.9.    . albuterol (VENTOLIN HFA) 108 (90 Base) MCG/ACT inhaler TAKE 2 PUFFS BY MOUTH EVERY 6 HOURS AS NEEDED FOR WHEEZE OR SHORTNESS OF BREATH 18 g 0  . amLODipine (NORVASC) 10 MG tablet Take 1 tablet (10 mg total) by mouth daily. 30 tablet 3  . aspirin EC 81 MG tablet Take 81 mg by mouth daily.    . Blood Glucose Monitoring Suppl (ACCU-CHEK AVIVA PLUS) w/Device KIT USE AS DIRECTED 2 TIMES DAILY 1 kit 0  . calcium-vitamin D (OSCAL WITH D) 250-125 MG-UNIT tablet Take 1 tablet by mouth daily.    . carvedilol (COREG) 25 MG tablet  Take 1 tablet (25 mg total) by mouth 2 (two) times daily. 60 tablet 3  . glucose blood test strip Used to check blood sugars twice daily. DX code E11.9. 100 each 12  . losartan-hydrochlorothiazide (HYZAAR) 100-12.5 MG tablet Take 1 tablet by mouth daily. 90 tablet 0  . metFORMIN (GLUCOPHAGE) 1000 MG tablet TAKE 1 TABLET BY MOUTH TWICE A DAY WITH MEALS 60 tablet 0  . pravastatin (PRAVACHOL) 20 MG tablet Take 1 tablet (20 mg total) by mouth daily. 90 tablet 1  . PROLIA 60 MG/ML SOLN injection   0  . repaglinide (PRANDIN) 2 MG tablet Take 1 tablet (2 mg total) by mouth 2 (two) times daily before a meal. 180 tablet 3  . tamoxifen (NOLVADEX) 20 MG tablet Take 1 tablet (20 mg total) by mouth daily. 90 tablet 3  . venlafaxine XR (EFFEXOR-XR) 37.5 MG 24 hr capsule TAKE 1 CAPSULE BY MOUTH DAILY WITH BREAKFAST. 90 capsule 0  . vitamin C (ASCORBIC ACID) 500 MG tablet Take 500 mg by mouth daily.     No current facility-administered medications on file prior to visit.    Allergies  Allergen Reactions  . Atorvastatin Other (See Comments)    dizziness  . Fosamax [Alendronate Sodium]     Cough,trouble shallowing, chest pain  . Lisinopril Cough  . Tetracycline Nausea And Vomiting    Blood pressure 130/62, pulse 66, temperature 98.1 F (36.7 C), height '5\' 5"'  (1.651 m), weight 189 lb (85.7 kg).  Assessment/Plan:  Patient with systolic hypertension, doing much better in the past 4-6 weeks.  Systolic readings still slightly elevated (avg 135-140) in the mornings, but < 130 in evenings.  She currently takes both the losartan/hct and amlodipine in the mornings.  Will have her move the amlodipine to evenings to see if we can better balance morning and evening blood pressure readings.  She will continue to self-monitor her blood pressure and knows to call the office should she see home readings trending back up to the 140 range   Tommy Medal PharmD CPP [facility] [address] [place] [address] 08/20/2019 7:12 AM

## 2019-08-19 NOTE — Patient Instructions (Addendum)
If you have any concerns or see your home blood pressure readings start to increase and trend to > 140/80,please give [place] a call.  Aarushi Hemric/Raquel at [phone]  Check your blood pressure at home 3-4 times each week and keep record of the readings.  Take your BP meds as follows:  Move the amlodipine to evenings, but otherwise no changes to medications  Bring all of your meds, your BP cuff and your record of home blood pressures to your next appointment.  Exercise as you're able, try to walk approximately 30 minutes per day.  Keep salt intake to a minimum, especially watch canned and prepared boxed foods.  Eat more fresh fruits and vegetables and fewer canned items.  Avoid eating in fast food restaurants.    HOW TO TAKE YOUR BLOOD PRESSURE: . Rest 5 minutes before taking your blood pressure. .  Don't smoke or drink caffeinated beverages for at least 30 minutes before. . Take your blood pressure before (not after) you eat. . Sit comfortably with your back supported and both feet on the floor (don't cross your legs). . Elevate your arm to heart level on a table or a desk. . Use the proper sized cuff. It should fit smoothly and snugly around your bare upper arm. There should be enough room to slip a fingertip under the cuff. The bottom edge of the cuff should be 1 inch above the crease of the elbow. . Ideally, take 3 measurements at one sitting and record the average.

## 2019-08-20 ENCOUNTER — Ambulatory Visit
Admission: RE | Admit: 2019-08-20 | Discharge: 2019-08-20 | Disposition: A | Discharge status: Home / Self Care | Payer: Medicare HMO | Source: Ambulatory Visit | Attending: Family Medicine | Admitting: Family Medicine

## 2019-08-20 ENCOUNTER — Other Ambulatory Visit: Payer: Self-pay | Admitting: Endocrinology

## 2019-08-20 DIAGNOSIS — M810 Age-related osteoporosis without current pathological fracture: Secondary | ICD-10-CM

## 2019-08-20 DIAGNOSIS — E2839 Other primary ovarian failure: Secondary | ICD-10-CM

## 2019-08-20 DIAGNOSIS — E1165 Type 2 diabetes mellitus with hyperglycemia: Secondary | ICD-10-CM

## 2019-08-20 DIAGNOSIS — Z780 Asymptomatic menopausal state: Secondary | ICD-10-CM | POA: Diagnosis not present

## 2019-08-20 NOTE — Assessment & Plan Note (Signed)
Patient with systolic hypertension, doing much better in the past 4-6 weeks.  Systolic readings still slightly elevated (avg 135-140) in the mornings, but < 130 in evenings.  She currently takes both the losartan/hct and amlodipine in the mornings.  Will have her move the amlodipine to evenings to see if we can better balance morning and evening blood pressure readings.  She will continue to self-monitor her blood pressure and knows to call the office should she see home readings trending back up to the 140 range.

## 2019-08-21 DIAGNOSIS — R2681 Unsteadiness on feet: Secondary | ICD-10-CM | POA: Diagnosis not present

## 2019-08-21 DIAGNOSIS — R262 Difficulty in walking, not elsewhere classified: Secondary | ICD-10-CM | POA: Diagnosis not present

## 2019-08-21 DIAGNOSIS — M6281 Muscle weakness (generalized): Secondary | ICD-10-CM | POA: Diagnosis not present

## 2019-08-21 DIAGNOSIS — R2689 Other abnormalities of gait and mobility: Secondary | ICD-10-CM | POA: Diagnosis not present

## 2019-08-24 NOTE — Progress Notes (Signed)
She has not read her results in the Mychart message. Please call and let her know the plan. Thanks.

## 2019-08-25 ENCOUNTER — Other Ambulatory Visit: Payer: Self-pay | Admitting: Cardiovascular Disease

## 2019-08-25 DIAGNOSIS — I10 Essential (primary) hypertension: Secondary | ICD-10-CM

## 2019-08-25 NOTE — Progress Notes (Signed)
Please have her take 2,000 IUs of vitamin D daily.

## 2019-08-28 DIAGNOSIS — M6281 Muscle weakness (generalized): Secondary | ICD-10-CM | POA: Diagnosis not present

## 2019-08-28 DIAGNOSIS — R262 Difficulty in walking, not elsewhere classified: Secondary | ICD-10-CM | POA: Diagnosis not present

## 2019-08-28 DIAGNOSIS — R2689 Other abnormalities of gait and mobility: Secondary | ICD-10-CM | POA: Diagnosis not present

## 2019-08-28 DIAGNOSIS — R2681 Unsteadiness on feet: Secondary | ICD-10-CM | POA: Diagnosis not present

## 2019-09-04 ENCOUNTER — Other Ambulatory Visit: Payer: Self-pay

## 2019-09-04 DIAGNOSIS — M6281 Muscle weakness (generalized): Secondary | ICD-10-CM | POA: Diagnosis not present

## 2019-09-04 DIAGNOSIS — R262 Difficulty in walking, not elsewhere classified: Secondary | ICD-10-CM | POA: Diagnosis not present

## 2019-09-04 DIAGNOSIS — R2689 Other abnormalities of gait and mobility: Secondary | ICD-10-CM | POA: Diagnosis not present

## 2019-09-04 DIAGNOSIS — R2681 Unsteadiness on feet: Secondary | ICD-10-CM | POA: Diagnosis not present

## 2019-09-09 ENCOUNTER — Encounter: Payer: Self-pay | Admitting: Endocrinology

## 2019-09-09 ENCOUNTER — Other Ambulatory Visit: Payer: Self-pay

## 2019-09-09 ENCOUNTER — Ambulatory Visit: Payer: Medicare HMO | Admitting: Endocrinology

## 2019-09-09 VITALS — BP 148/88 | HR 75 | Ht 65.0 in | Wt 200.0 lb

## 2019-09-09 DIAGNOSIS — E119 Type 2 diabetes mellitus without complications: Secondary | ICD-10-CM | POA: Diagnosis not present

## 2019-09-09 LAB — POCT GLYCOSYLATED HEMOGLOBIN (HGB A1C): Hemoglobin A1C: 6.7 % — AB (ref 4.0–5.6)

## 2019-09-09 NOTE — Progress Notes (Signed)
Subjective:    Patient ID: [name], female    DOB: 1945-11-20, 74 y.o.   MRN: [id number]  HPI Pt returns for f/u of diabetes mellitus: DM type: 2 Dx'ed: 6415 Complications: none Therapy: 2 oral meds GDM: 1985 DKA: never Severe hypoglycemia: never Pancreatitis: never Pancreatic imaging: never SDOH: she cannot afford brand name meds. Other: she has never been on insulin. Interval history: pt states she feels well in general.   Pt says cbg's are well-controlled.  She takes meds as rx'ed.   Past Medical History:  Diagnosis Date  . Abnormal bone density screening   . Arthritis   . Cancer ([place]) 07/2017   left breast cancer  . Complication of anesthesia    "i wake up"  . Diverticulosis   . DM (diabetes mellitus) ([place])   . Family history of colon cancer   . Family history of ovarian cancer   . Family history of pancreatic cancer   . Family history of prostate cancer   . Family history of stomach cancer   . GERD (gastroesophageal reflux disease)   . History of radiation therapy 07/10/17- 08/06/17   40.05 Gy directed to the left breast in 15 fractions, followed by a boost of 10 Gy in 5 fractions.   Marland Kitchen HTN (hypertension)   . Hyperlipidemia   . Osteoporosis 06/15/2017  . Personal history of radiation therapy   . Poor compliance   . Vitamin D deficiency     Past Surgical History:  Procedure Laterality Date  . APPENDECTOMY    . BREAST BIOPSY Left 04/24/2017   malignant  . BREAST BIOPSY Left 04/24/2017   benign  . BREAST BIOPSY Left 04/25/2017   benign  . BREAST BIOPSY Left 04/2018   fibroadenoma w/ calcs  . BREAST LUMPECTOMY Left 05/30/2017  . BREAST LUMPECTOMY WITH RADIOACTIVE SEED LOCALIZATION Left 05/30/2017   Procedure: LEFT BREAST LUMPECTOMY WITH RADIOACTIVE SEED LOCALIZATION;  Surgeon: Erroll Luna, MD;  Location: [facility];  Service: General;  Laterality: Left;  . COLONOSCOPY  06/12/2005   Schooler/normal exam="i woke up during it"  . DILATION AND CURETTAGE OF  UTERUS    . FOREIGN BODY REMOVAL ESOPHAGEAL    . TONSILLECTOMY    . TUBAL LIGATION      Social History   Socioeconomic History  . Marital status: Married    Spouse name: Not on file  . Number of children: Not on file  . Years of education: Not on file  . Highest education level: Not on file  Occupational History  . Not on file  Tobacco Use  . Smoking status: Never Smoker  . Smokeless tobacco: Never Used  Substance and Sexual Activity  . Alcohol use: No  . Drug use: No  . Sexual activity: Not Currently  Other Topics Concern  . Not on file  Social History Narrative  . Not on file   Social Determinants of Health   Financial Resource Strain:   . Difficulty of Paying Living Expenses:   Food Insecurity:   . Worried About Charity fundraiser in the Last Year:   . Arboriculturist in the Last Year:   Transportation Needs:   . Film/video editor (Medical):   Marland Kitchen Lack of Transportation (Non-Medical):   Physical Activity:   . Days of Exercise per Week:   . Minutes of Exercise per Session:   Stress:   . Feeling of Stress :   Social Connections:   . Frequency of Communication  with Friends and Family:   . Frequency of Social Gatherings with Friends and Family:   . Attends Religious Services:   . Active Member of Clubs or Organizations:   . Attends Archivist Meetings:   Marland Kitchen Marital Status:   Intimate Partner Violence:   . Fear of Current or Ex-Partner:   . Emotionally Abused:   Marland Kitchen Physically Abused:   . Sexually Abused:     Current Outpatient Medications on File Prior to Visit  Medication Sig Dispense Refill  . Accu-Chek Softclix Lancets lancets 1 each by Other route 2 (two) times daily. Used to check blood sugars twice daily. DX code E11.9.    . albuterol (VENTOLIN HFA) 108 (90 Base) MCG/ACT inhaler TAKE 2 PUFFS BY MOUTH EVERY 6 HOURS AS NEEDED FOR WHEEZE OR SHORTNESS OF BREATH 18 g 0  . amLODipine (NORVASC) 10 MG tablet TAKE 1 TABLET BY MOUTH EVERY DAY 90  tablet 1  . aspirin EC 81 MG tablet Take 81 mg by mouth daily.    . Blood Glucose Monitoring Suppl (ACCU-CHEK AVIVA PLUS) w/Device KIT USE AS DIRECTED 2 TIMES DAILY 1 kit 0  . calcium-vitamin D (OSCAL WITH D) 250-125 MG-UNIT tablet Take 1 tablet by mouth daily.    . carvedilol (COREG) 25 MG tablet TAKE 1 TABLET BY MOUTH TWICE A DAY 180 tablet 1  . glucose blood test strip Used to check blood sugars twice daily. DX code E11.9. 100 each 12  . losartan-hydrochlorothiazide (HYZAAR) 100-12.5 MG tablet TAKE 1 TABLET BY MOUTH EVERY DAY 90 tablet 3  . metFORMIN (GLUCOPHAGE) 1000 MG tablet TAKE 1 TABLET BY MOUTH TWICE A DAY WITH MEALS 60 tablet 0  . pravastatin (PRAVACHOL) 20 MG tablet Take 1 tablet (20 mg total) by mouth daily. 90 tablet 1  . PROLIA 60 MG/ML SOLN injection   0  . repaglinide (PRANDIN) 2 MG tablet Take 1 tablet (2 mg total) by mouth 2 (two) times daily before a meal. 180 tablet 3  . tamoxifen (NOLVADEX) 20 MG tablet Take 1 tablet (20 mg total) by mouth daily. 90 tablet 3  . venlafaxine XR (EFFEXOR-XR) 37.5 MG 24 hr capsule TAKE 1 CAPSULE BY MOUTH DAILY WITH BREAKFAST. 90 capsule 0  . vitamin C (ASCORBIC ACID) 500 MG tablet Take 500 mg by mouth daily.     No current facility-administered medications on file prior to visit.    Allergies  Allergen Reactions  . Atorvastatin Other (See Comments)    dizziness  . Fosamax [Alendronate Sodium]     Cough,trouble shallowing, chest pain  . Lisinopril Cough  . Tetracycline Nausea And Vomiting    Family History  Problem Relation Age of Onset  . Colon cancer Mother 34  . Ovarian cancer Mother 60       'radium treatment'  . Cardiomyopathy Mother   . Colon polyps Mother   . Prostate cancer Maternal Uncle 55       metastatic 'died from it'  . Stomach cancer Maternal Grandmother 80  . Heart disease Maternal Grandmother   . Diabetes Maternal Grandfather   . Pancreatic cancer Maternal Aunt 66  . Prostate cancer Maternal Uncle 30        metastatic/ 'died from it'  . Cervical cancer Daughter   . Thyroid cancer Paternal Uncle   . Thyroid cancer Cousin   . Esophageal cancer Neg Hx   . Rectal cancer Neg Hx     BP (!) 148/88   Pulse 75  Ht 5' 5" (1.651 m)   Wt 200 lb (90.7 kg)   SpO2 96%   BMI 33.28 kg/m    Review of Systems She denies hypoglycemia    Objective:   Physical Exam VITAL SIGNS:  See vs page GENERAL: no distress Pulses: dorsalis pedis intact bilat.   MSK: no deformity of the feet CV: trace bilat leg edema Skin:  no ulcer on the feet.  normal color and temp on the feet. Neuro: sensation is intact to touch on the feet.   Ext: there is bilateral onychomycosis of the toenails.     Lab Results  Component Value Date   HGBA1C 6.7 (A) 09/09/2019       Assessment & Plan:  HTN: is noted today Type 2 DM: well-controlled.    Patient Instructions  Your blood pressure is high today.  Please see your primary care provider soon, to have it rechecked Please continue the same medications for diabetes.  check your blood sugar once a day.  vary the time of day when you check, between before the 3 meals, and at bedtime.  also check if you have symptoms of your blood sugar being too high or too low.  please keep a record of the readings and bring it to your next appointment here (or you can bring the meter itself).  You can write it on any piece of paper.  please call us sooner if your blood sugar goes below 70, or if you have a lot of readings over 200.   Please come back for a follow-up appointment in 4 months.

## 2019-09-09 NOTE — Patient Instructions (Addendum)
Your blood pressure is high today.  Please see your primary care provider soon, to have it rechecked Please continue the same medications for diabetes.  check your blood sugar once a day.  vary the time of day when you check, between before the 3 meals, and at bedtime.  also check if you have symptoms of your blood sugar being too high or too low.  please keep a record of the readings and bring it to your next appointment here (or you can bring the meter itself).  You can write it on any piece of paper.  please call us sooner if your blood sugar goes below 70, or if you have a lot of readings over 200.   Please come back for a follow-up appointment in 4 months.

## 2019-09-10 ENCOUNTER — Other Ambulatory Visit: Payer: Self-pay | Admitting: Endocrinology

## 2019-09-10 ENCOUNTER — Encounter: Payer: Self-pay | Admitting: Family Medicine

## 2019-09-10 ENCOUNTER — Other Ambulatory Visit: Payer: Self-pay | Admitting: Cardiovascular Disease

## 2019-09-10 DIAGNOSIS — E1165 Type 2 diabetes mellitus with hyperglycemia: Secondary | ICD-10-CM

## 2019-09-10 NOTE — Telephone Encounter (Signed)
*  STAT* If patient is at the pharmacy, call can be transferred to refill team.   1. Which medications need to be refilled? (please list name of each medication and dose if known) losartan-hydrochlorothiazide (HYZAAR) 100-12.5 MG tablet  2. Which pharmacy/location (including street and city if local pharmacy) is medication to be sent to? [id number] - [place], [place] - [address]  3. Do they need a 30 day or 90 day supply? 90 day  Patient has no medication left.

## 2019-09-12 DIAGNOSIS — R2689 Other abnormalities of gait and mobility: Secondary | ICD-10-CM | POA: Diagnosis not present

## 2019-09-12 DIAGNOSIS — M6281 Muscle weakness (generalized): Secondary | ICD-10-CM | POA: Diagnosis not present

## 2019-09-12 DIAGNOSIS — R262 Difficulty in walking, not elsewhere classified: Secondary | ICD-10-CM | POA: Diagnosis not present

## 2019-09-12 DIAGNOSIS — R2681 Unsteadiness on feet: Secondary | ICD-10-CM | POA: Diagnosis not present

## 2019-09-18 DIAGNOSIS — R2681 Unsteadiness on feet: Secondary | ICD-10-CM | POA: Diagnosis not present

## 2019-09-18 DIAGNOSIS — M6281 Muscle weakness (generalized): Secondary | ICD-10-CM | POA: Diagnosis not present

## 2019-09-18 DIAGNOSIS — R262 Difficulty in walking, not elsewhere classified: Secondary | ICD-10-CM | POA: Diagnosis not present

## 2019-09-18 DIAGNOSIS — R2689 Other abnormalities of gait and mobility: Secondary | ICD-10-CM | POA: Diagnosis not present

## 2019-10-30 ENCOUNTER — Other Ambulatory Visit: Payer: Self-pay | Admitting: Family Medicine

## 2019-10-30 DIAGNOSIS — F419 Anxiety disorder, unspecified: Secondary | ICD-10-CM

## 2019-12-08 ENCOUNTER — Other Ambulatory Visit: Payer: Self-pay | Admitting: Endocrinology

## 2019-12-08 DIAGNOSIS — E1165 Type 2 diabetes mellitus with hyperglycemia: Secondary | ICD-10-CM

## 2020-01-12 ENCOUNTER — Encounter: Payer: Self-pay | Admitting: Family Medicine

## 2020-01-14 ENCOUNTER — Other Ambulatory Visit (INDEPENDENT_AMBULATORY_CARE_PROVIDER_SITE_OTHER): Payer: Medicare HMO

## 2020-01-14 ENCOUNTER — Other Ambulatory Visit: Payer: Medicare HMO

## 2020-01-14 DIAGNOSIS — Z20822 Contact with and (suspected) exposure to covid-19: Secondary | ICD-10-CM

## 2020-01-14 LAB — POC COVID19 BINAXNOW: SARS Coronavirus 2 Ag: NEGATIVE

## 2020-01-15 ENCOUNTER — Ambulatory Visit: Payer: Medicare HMO | Admitting: Endocrinology

## 2020-01-16 LAB — NOVEL CORONAVIRUS, NAA: SARS-CoV-2, NAA: NOT DETECTED

## 2020-01-16 LAB — SARS-COV-2, NAA 2 DAY TAT

## 2020-01-21 ENCOUNTER — Other Ambulatory Visit: Payer: Self-pay | Admitting: Family Medicine

## 2020-01-21 DIAGNOSIS — F419 Anxiety disorder, unspecified: Secondary | ICD-10-CM

## 2020-01-21 NOTE — Telephone Encounter (Signed)
Ok to refill 

## 2020-01-24 ENCOUNTER — Other Ambulatory Visit: Payer: Self-pay | Admitting: Family Medicine

## 2020-01-26 ENCOUNTER — Other Ambulatory Visit (HOSPITAL_COMMUNITY): Payer: Self-pay | Admitting: Family Medicine

## 2020-01-26 MED FILL — PROLIA 60 MG/ML SOLN: 60 | 180 days supply | Qty: 1 | Fill #0

## 2020-01-28 ENCOUNTER — Other Ambulatory Visit: Payer: Self-pay

## 2020-01-28 ENCOUNTER — Other Ambulatory Visit: Payer: Medicare HMO

## 2020-01-28 DIAGNOSIS — M810 Age-related osteoporosis without current pathological fracture: Secondary | ICD-10-CM

## 2020-01-28 DIAGNOSIS — Z23 Encounter for immunization: Secondary | ICD-10-CM

## 2020-01-28 MED ORDER — DENOSUMAB 60 MG/ML ~~LOC~~ SOSY
60.0000 mg | PREFILLED_SYRINGE | Freq: Once | SUBCUTANEOUS | Status: AC
  Administered 2020-01-28: 60 mg via SUBCUTANEOUS

## 2020-02-10 ENCOUNTER — Ambulatory Visit (INDEPENDENT_AMBULATORY_CARE_PROVIDER_SITE_OTHER): Payer: Medicare HMO | Admitting: Endocrinology

## 2020-02-10 ENCOUNTER — Encounter: Payer: Self-pay | Admitting: Endocrinology

## 2020-02-10 ENCOUNTER — Other Ambulatory Visit: Payer: Self-pay

## 2020-02-10 VITALS — BP 152/68 | HR 76 | Ht 65.0 in | Wt 209.0 lb

## 2020-02-10 DIAGNOSIS — E119 Type 2 diabetes mellitus without complications: Secondary | ICD-10-CM | POA: Diagnosis not present

## 2020-02-10 LAB — POCT GLYCOSYLATED HEMOGLOBIN (HGB A1C): Hemoglobin A1C: 7.4 % — AB (ref 4.0–5.6)

## 2020-02-10 MED ORDER — BROMOCRIPTINE MESYLATE 2.5 MG PO TABS: 1.2500 mg | ORAL_TABLET | Freq: Every day | ORAL | 11 refills | Status: DC

## 2020-02-10 NOTE — Patient Instructions (Addendum)
Your blood pressure is high today.  Please see your primary care provider soon, to have it rechecked I have sent a prescription to your pharmacy, to add "bromocriptine." Please continue the same other medications for diabetes.  check your blood sugar once a day.  vary the time of day when you check, between before the 3 meals, and at bedtime.  also check if you have symptoms of your blood sugar being too high or too low.  please keep a record of the readings and bring it to your next appointment here (or you can bring the meter itself).  You can write it on any piece of paper.  please call us sooner if your blood sugar goes below 70, or if you have a lot of readings over 200.   Please come back for a follow-up appointment in 3 months.

## 2020-02-10 NOTE — Progress Notes (Signed)
Subjective:    Patient ID: [name], female    DOB: [birth year], 74 y.o.   MRN: [id number]  HPI Pt returns for f/u of diabetes mellitus:  DM type: 2 Dx'ed: 9407 Complications: none Therapy: 2 oral meds GDM: 1985 DKA: never Severe hypoglycemia: never Pancreatitis: never Pancreatic imaging: never SDOH: she cannot afford brand name meds. Other: she has never been on insulin. Interval history: pt states she feels well in general.   Pt says cbg's are well-controlled.  She takes meds as rx'ed.   Past Medical History:  Diagnosis Date  . Abnormal bone density screening   . Arthritis   . Cancer ([place]) 07/2017   left breast cancer  . Complication of anesthesia    "i wake up"  . Diverticulosis   . DM (diabetes mellitus) ([place])   . Family history of colon cancer   . Family history of ovarian cancer   . Family history of pancreatic cancer   . Family history of prostate cancer   . Family history of stomach cancer   . GERD (gastroesophageal reflux disease)   . History of radiation therapy 07/10/17- 08/06/17   40.05 Gy directed to the left breast in 15 fractions, followed by a boost of 10 Gy in 5 fractions.   Marland Kitchen HTN (hypertension)   . Hyperlipidemia   . Osteoporosis 06/15/2017  . Personal history of radiation therapy   . Poor compliance   . Vitamin D deficiency     Past Surgical History:  Procedure Laterality Date  . APPENDECTOMY    . BREAST BIOPSY Left 04/24/2017   malignant  . BREAST BIOPSY Left 04/24/2017   benign  . BREAST BIOPSY Left 04/25/2017   benign  . BREAST BIOPSY Left 04/2018   fibroadenoma w/ calcs  . BREAST LUMPECTOMY Left 05/30/2017  . BREAST LUMPECTOMY WITH RADIOACTIVE SEED LOCALIZATION Left 05/30/2017   Procedure: LEFT BREAST LUMPECTOMY WITH RADIOACTIVE SEED LOCALIZATION;  Surgeon: Erroll Luna, MD;  Location: [facility];  Service: General;  Laterality: Left;  . COLONOSCOPY  06/12/2005   Schooler/normal exam="i woke up during it"  . DILATION AND CURETTAGE OF  UTERUS    . FOREIGN BODY REMOVAL ESOPHAGEAL    . TONSILLECTOMY    . TUBAL LIGATION      Social History   Socioeconomic History  . Marital status: Married    Spouse name: Not on file  . Number of children: Not on file  . Years of education: Not on file  . Highest education level: Not on file  Occupational History  . Not on file  Tobacco Use  . Smoking status: Never Smoker  . Smokeless tobacco: Never Used  Vaping Use  . Vaping Use: Never used  Substance and Sexual Activity  . Alcohol use: No  . Drug use: No  . Sexual activity: Not Currently  Other Topics Concern  . Not on file  Social History Narrative  . Not on file   Social Determinants of Health   Financial Resource Strain:   . Difficulty of Paying Living Expenses: Not on file  Food Insecurity:   . Worried About Charity fundraiser in the Last Year: Not on file  . Ran Out of Food in the Last Year: Not on file  Transportation Needs:   . Lack of Transportation (Medical): Not on file  . Lack of Transportation (Non-Medical): Not on file  Physical Activity:   . Days of Exercise per Week: Not on file  . Minutes of Exercise  per Session: Not on file  Stress:   . Feeling of Stress : Not on file  Social Connections:   . Frequency of Communication with Friends and Family: Not on file  . Frequency of Social Gatherings with Friends and Family: Not on file  . Attends Religious Services: Not on file  . Active Member of Clubs or Organizations: Not on file  . Attends Banker Meetings: Not on file  . Marital Status: Not on file  Intimate Partner Violence:   . Fear of Current or Ex-Partner: Not on file  . Emotionally Abused: Not on file  . Physically Abused: Not on file  . Sexually Abused: Not on file    Current Outpatient Medications on File Prior to Visit  Medication Sig Dispense Refill  . Accu-Chek Softclix Lancets lancets 1 each by Other route 2 (two) times daily. Used to check blood sugars twice daily. DX  code E11.9.    . albuterol (VENTOLIN HFA) 108 (90 Base) MCG/ACT inhaler TAKE 2 PUFFS BY MOUTH EVERY 6 HOURS AS NEEDED FOR WHEEZE OR SHORTNESS OF BREATH 18 g 0  . amLODipine (NORVASC) 10 MG tablet TAKE 1 TABLET BY MOUTH EVERY DAY 90 tablet 1  . aspirin EC 81 MG tablet Take 81 mg by mouth daily.    . Blood Glucose Monitoring Suppl (ACCU-CHEK AVIVA PLUS) w/Device KIT USE AS DIRECTED 2 TIMES DAILY 1 kit 0  . calcium-vitamin D (OSCAL WITH D) 250-125 MG-UNIT tablet Take 1 tablet by mouth daily.    . carvedilol (COREG) 25 MG tablet TAKE 1 TABLET BY MOUTH TWICE A DAY 180 tablet 1  . glucose blood test strip Used to check blood sugars twice daily. DX code E11.9. 100 each 12  . losartan-hydrochlorothiazide (HYZAAR) 100-12.5 MG tablet TAKE 1 TABLET BY MOUTH EVERY DAY 90 tablet 3  . metFORMIN (GLUCOPHAGE) 1000 MG tablet TAKE 1 TABLET BY MOUTH TWICE A DAY WITH MEALS 180 tablet 1  . pravastatin (PRAVACHOL) 20 MG tablet TAKE 1 TABLET (20 MG TOTAL) BY MOUTH DAILY. 90 tablet 1  . PROLIA 60 MG/ML SOLN injection   0  . repaglinide (PRANDIN) 2 MG tablet Take 1 tablet (2 mg total) by mouth 2 (two) times daily before a meal. 180 tablet 3  . tamoxifen (NOLVADEX) 20 MG tablet Take 1 tablet (20 mg total) by mouth daily. 90 tablet 3  . venlafaxine XR (EFFEXOR-XR) 37.5 MG 24 hr capsule TAKE 1 CAPSULE BY MOUTH DAILY WITH BREAKFAST. 90 capsule 0  . vitamin C (ASCORBIC ACID) 500 MG tablet Take 500 mg by mouth daily.     No current facility-administered medications on file prior to visit.    Allergies  Allergen Reactions  . Atorvastatin Other (See Comments)    dizziness  . Fosamax [Alendronate Sodium]     Cough,trouble shallowing, chest pain  . Lisinopril Cough  . Tetracycline Nausea And Vomiting    Family History  Problem Relation Age of Onset  . Colon cancer Mother 65  . Ovarian cancer Mother 64       'radium treatment'  . Cardiomyopathy Mother   . Colon polyps Mother   . Prostate cancer Maternal Uncle 38        metastatic 'died from it'  . Stomach cancer Maternal Grandmother 28  . Heart disease Maternal Grandmother   . Diabetes Maternal Grandfather   . Pancreatic cancer Maternal Aunt 58  . Prostate cancer Maternal Uncle 32       metastatic/ 'died from  it'  . Cervical cancer Daughter   . Thyroid cancer Paternal Uncle   . Thyroid cancer Cousin   . Esophageal cancer Neg Hx   . Rectal cancer Neg Hx     BP (!) 152/68   Pulse 76   Ht _0  (1.651 m)   Wt 209 lb (94.8 kg)   SpO2 97%   BMI 34.78 kg/m    Review of Systems     Objective:   Physical Exam VITAL SIGNS:  See vs page GENERAL: no distress Pulses: dorsalis pedis intact bilat.   MSK: no deformity of the feet CV: 1+ bilat leg edema Skin:  no ulcer on the feet.  normal color and temp on the feet. Neuro: sensation is intact to touch on the feet Ext: there is bilateral onychomycosis of the toenails  Lab Results  Component Value Date   HGBA1C 7.4 (A) 02/10/2020   Lab Results  Component Value Date   CREATININE 0.75 04/29/2019   BUN 14 04/29/2019   NA 141 04/29/2019   K 4.6 04/29/2019   CL 103 04/29/2019   CO2 23 04/29/2019       Assessment & Plan:  HTN: is noted today Type 2 DM: uncontrolled.   Patient Instructions  Your blood pressure is high today.  Please see your primary care provider soon, to have it rechecked I have sent a prescription to your pharmacy, to add "bromocriptine." Please continue the same other medications for diabetes.  check your blood sugar once a day.  vary the time of day when you check, between before the 3 meals, and at bedtime.  also check if you have symptoms of your blood sugar being too high or too low.  please keep a record of the readings and bring it to your next appointment here (or you can bring the meter itself).  You can write it on any piece of paper.  please call us sooner if your blood sugar goes below 70, or if you have a lot of readings over 200.   Please come back for a  follow-up appointment in 3 months.

## 2020-02-18 ENCOUNTER — Other Ambulatory Visit: Payer: Self-pay | Admitting: Cardiovascular Disease

## 2020-02-18 DIAGNOSIS — I10 Essential (primary) hypertension: Secondary | ICD-10-CM

## 2020-03-10 ENCOUNTER — Other Ambulatory Visit: Payer: Self-pay | Admitting: Endocrinology

## 2020-03-31 ENCOUNTER — Telehealth: Payer: Self-pay | Admitting: Hematology and Oncology

## 2020-03-31 NOTE — Telephone Encounter (Signed)
Rescheduled appointment due to provider PAL. Patient is aware of changes. 

## 2020-04-13 ENCOUNTER — Other Ambulatory Visit: Payer: Self-pay | Admitting: Family Medicine

## 2020-04-13 DIAGNOSIS — F419 Anxiety disorder, unspecified: Secondary | ICD-10-CM

## 2020-04-20 NOTE — Progress Notes (Signed)
Virtual Visit via Telephone Note   This visit type was conducted due to national recommendations for restrictions regarding the COVID-19 Pandemic (e.g. social distancing) in an effort to limit this patient's exposure and mitigate transmission in our community.  Due to her co-morbid illnesses, this patient is at least at moderate risk for complications without adequate follow up.  This format is felt to be most appropriate for this patient at this time.  The patient did not have access to video technology/had technical difficulties with video requiring transitioning to audio format only (telephone).  All issues noted in this document were discussed and addressed.  No physical exam could be performed with this format.  Please refer to the patient's chart for her  consent to telehealth for [facility].  Evaluation Performed:  Follow-up visit  This visit type was conducted due to national recommendations for restrictions regarding the COVID-19 Pandemic (e.g. social distancing).  This format is felt to be most appropriate for this patient at this time.  All issues noted in this document were discussed and addressed.  No physical exam was performed (except for noted visual exam findings with Video Visits).  Please refer to the patient's chart (MyChart message for video visits and phone note for telephone visits) for the patient's consent to telehealth for [facility]  Date:  04/21/2020   ID:  [name], DOB 1945-06-15, MRN [id number]  Patient Location:  [address] [address]   Provider location:     [place] [place] [address] Office [phone] Fax [phone]   PCP:  Girtha Rm, NP-C  Cardiologist:  Quay Burow, MD  Electrophysiologist:  None   Chief Complaint: Follow-up for hypertension  History of Present Illness:    [name] is a 75 y.o. female who presents via audio/video  conferencing for a telehealth visit today.  Patient verified DOB and address.  She has a PMH of essential hypertension, GERD, type 2 diabetes, hyperlipidemia, heart murmur, substernal chest pain, anxiety, bilateral lower extremity edema, and elevated AST.  She was seen by Dr. Gwenlyn Found on 04/08/2019.  She had been referred at that time by her PCP due to the increased volume of her cardiac murmur.  Her blood pressure was elevated at that time 199/87.  She reported that her blood pressure was much less at home.  She was on statin therapy and her LDL was 67.  A follow-up echocardiogram showed an EF of 60-65%, moderate LVH, and G2 DD.  Severe mitral annular calcification was noted.  No evidence of mitral regurgitation was seen.  She is seen virtually today in follow-up and states she feels well. 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 She reports some slight calf and ankle swelling. She reports this dissipates overnight. She is fairly sedentary due to her job. She reports that she has been changing her diet and is trying to eat more clean. She reports avoiding salt. She states that she takes extra precautions while driving to avoid [id number] infection. She has been monitoring her blood pressure on and reports it is somewhat variable ranging from the 120s over 60s-70s and at times will increase to 140 over 60s and 70s. I will give her the salty six diet sheet, have her increase her physical activity as tolerated, monitor her blood pressure 1-2 times per month and log. We will have her follow-up in 1 year and as  needed.  Today she denies chest pain, shortness of breath, lower extremity edema, fatigue, palpitations, melena, hematuria, hemoptysis, diaphoresis, weakness, presyncope, syncope, orthopnea, and PND.   The patient does not symptoms concerning for COVID-19 infection (fever, chills, cough, or new SHORTNESS OF BREATH).    Prior CV studies:   The following  studies were reviewed today:  Echocardiogram 04/17/2019 IMPRESSIONS    1. Left ventricular ejection fraction, by visual estimation, is 60 to  65%. The left ventricle has normal function. There is moderately increased  left ventricular hypertrophy.  2. Elevated left atrial pressure.  3. Left ventricular diastolic parameters are consistent with Grade II  diastolic dysfunction (pseudonormalization).  4. Global right ventricle has normal systolic function.The right  ventricular size is normal.  5. Left atrial size was normal.  6. Right atrial size was normal.  7. Severe mitral annular calcification.  8. The mitral valve is abnormal. No evidence of mitral valve  regurgitation.  9. The tricuspid valve is normal in structure.  10. The aortic valve is tricuspid. Aortic valve regurgitation is not  visualized. Mild aortic valve sclerosis without stenosis.  11. The pulmonic valve was not well visualized. Pulmonic valve  regurgitation is not visualized.  12. There is mild dilatation of the ascending aorta measuring 36 mm.  13. The inferior vena cava is normal in size with greater than 50%  respiratory variability, suggesting right atrial pressure of 3 mmHg.  14. TR signal is inadequate for assessing pulmonary artery systolic  pressure.   Past Medical History:  Diagnosis Date  . Abnormal bone density screening   . Arthritis   . Cancer ([place]) 07/2017   left breast cancer  . Complication of anesthesia    "i wake up"  . Diverticulosis   . DM (diabetes mellitus) ([place])   . Family history of colon cancer   . Family history of ovarian cancer   . Family history of pancreatic cancer   . Family history of prostate cancer   . Family history of stomach cancer   . GERD (gastroesophageal reflux disease)   . History of radiation therapy 07/10/17- 08/06/17   40.05 Gy directed to the left breast in 15 fractions, followed by a boost of 10 Gy in 5 fractions.   Marland Kitchen HTN (hypertension)   .  Hyperlipidemia   . Osteoporosis 06/15/2017  . Personal history of radiation therapy   . Poor compliance   . Vitamin D deficiency    Past Surgical History:  Procedure Laterality Date  . APPENDECTOMY    . BREAST BIOPSY Left 04/24/2017   malignant  . BREAST BIOPSY Left 04/24/2017   benign  . BREAST BIOPSY Left 04/25/2017   benign  . BREAST BIOPSY Left 04/2018   fibroadenoma w/ calcs  . BREAST LUMPECTOMY Left 05/30/2017  . BREAST LUMPECTOMY WITH RADIOACTIVE SEED LOCALIZATION Left 05/30/2017   Procedure: LEFT BREAST LUMPECTOMY WITH RADIOACTIVE SEED LOCALIZATION;  Surgeon: Erroll Luna, MD;  Location: [facility];  Service: General;  Laterality: Left;  . COLONOSCOPY  06/12/2005   Schooler/normal exam="i woke up during it"  . DILATION AND CURETTAGE OF UTERUS    . FOREIGN BODY REMOVAL ESOPHAGEAL    . TONSILLECTOMY    . TUBAL LIGATION       Current Meds  Medication Sig  . Accu-Chek Softclix Lancets lancets 1 each by Other route 2 (two) times daily. Used to check blood sugars twice daily. DX code E11.9.  Marland Kitchen amLODipine (NORVASC) 10 MG tablet TAKE 1 TABLET BY MOUTH  EVERY DAY  . aspirin EC 81 MG tablet Take 81 mg by mouth daily.  . Blood Glucose Monitoring Suppl (ACCU-CHEK AVIVA PLUS) w/Device KIT USE AS DIRECTED 2 TIMES DAILY  . bromocriptine (PARLODEL) 2.5 MG tablet Take 0.5 tablets (1.25 mg total) by mouth at bedtime.  . calcium-vitamin D (OSCAL WITH D) 250-125 MG-UNIT tablet Take 1 tablet by mouth daily.  . carvedilol (COREG) 25 MG tablet TAKE 1 TABLET BY MOUTH TWICE A DAY  . glucose blood test strip Used to check blood sugars twice daily. DX code E11.9.  . losartan-hydrochlorothiazide (HYZAAR) 100-12.5 MG tablet TAKE 1 TABLET BY MOUTH EVERY DAY  . metFORMIN (GLUCOPHAGE) 1000 MG tablet TAKE 1 TABLET BY MOUTH TWICE A DAY WITH MEALS  . pravastatin (PRAVACHOL) 20 MG tablet TAKE 1 TABLET (20 MG TOTAL) BY MOUTH DAILY.  Marland Kitchen PROLIA 60 MG/ML SOLN injection   . repaglinide (PRANDIN) 2 MG tablet TAKE 1  TABLET (2 MG TOTAL) BY MOUTH 2 (TWO) TIMES DAILY BEFORE A MEAL.  . tamoxifen (NOLVADEX) 20 MG tablet Take 1 tablet (20 mg total) by mouth daily.  Marland Kitchen venlafaxine XR (EFFEXOR-XR) 37.5 MG 24 hr capsule TAKE 1 CAPSULE BY MOUTH DAILY WITH BREAKFAST.  Marland Kitchen vitamin C (ASCORBIC ACID) 500 MG tablet Take 500 mg by mouth daily.     Allergies:   Atorvastatin, Fosamax [alendronate sodium], Lisinopril, and Tetracycline   Social History   Tobacco Use  . Smoking status: Never Smoker  . Smokeless tobacco: Never Used  Vaping Use  . Vaping Use: Never used  Substance Use Topics  . Alcohol use: No  . Drug use: No     Family Hx: The patient's family history includes Cardiomyopathy in her mother; Cervical cancer in her daughter; Colon cancer (age of onset: 30) in her mother; Colon polyps in her mother; Diabetes in her maternal grandfather; Heart disease in her maternal grandmother; Ovarian cancer (age of onset: 45) in her mother; Pancreatic cancer (age of onset: 43) in her maternal aunt; Prostate cancer (age of onset: 24) in her maternal uncle; Prostate cancer (age of onset: 60) in her maternal uncle; Stomach cancer (age of onset: 43) in her maternal grandmother; Thyroid cancer in her cousin and paternal uncle. There is no history of Esophageal cancer or Rectal cancer.  ROS:   Please see the history of present illness.     All other systems reviewed and are negative.   Labs/Other Tests and Data Reviewed:    Recent Labs: 04/29/2019: ALT 21; BUN 14; Creatinine, Ser 0.75; Potassium 4.6; Sodium 141 07/28/2019: Hemoglobin 12.2; Platelets 232   Recent Lipid Panel Lab Results  Component Value Date/Time   CHOL 151 07/28/2019 09:19 AM   TRIG 177 (H) 07/28/2019 09:19 AM   HDL 39 (L) 07/28/2019 09:19 AM   CHOLHDL 3.9 07/28/2019 09:19 AM   CHOLHDL 3.8 02/05/2017 08:58 AM   LDLCALC 82 07/28/2019 09:19 AM   LDLCALC 92 02/05/2017 08:58 AM   LDLDIRECT 172.1 10/16/2006 09:28 AM    Wt Readings from Last 3  Encounters:  04/21/20 200 lb (90.7 kg)  02/10/20 209 lb (94.8 kg)  09/09/19 200 lb (90.7 kg)     Exam:    Vital Signs:  BP (!) 142/65   Ht _0  (1.651 m)   Wt 200 lb (90.7 kg)   BMI 33.28 kg/m    Well nourished, well developed female in no  acute distress.   ASSESSMENT & PLAN:    1. Hyperlipidemia-07/28/2019: Cholesterol, Total 151; HDL  39; LDL Chol Calc (NIH) 82; Triglycerides 177 Continue pravastatin,  Heart healthy low-sodium high-fiber diet Increase physical activity as tolerated  Essential hypertension-BP today 142/65. Somewhat labile at home ranging from 120s over 60s and 70s and at times up to 148 over 60s. Blood pressure log Continue amlodipine, carvedilol, losartan, HCTZ, Heart healthy low-sodium diet-salty 6 given Increase physical activity as tolerated- discussed more walking  Cardiac murmur- echocardiogram 04/17/2019 showed normal EF, moderate LVH, G2 DD, severe mitral annular calcification but no evidence of mitral valve regurgitation.  No other significant valvular abnormalities are noted. Repeat echo when clinically indicated  Disposition: Follow-up with Dr. Gwenlyn Found or me in 1 year.  COVID-19 Education: The signs and symptoms of COVID-19 were discussed with the patient and how to seek care for testing (follow up with PCP or arrange E-visit).  The importance of social distancing was discussed today.  Patient Risk:   After full review of this patients clinical status, I feel that they are at least moderate risk at this time.  Time:   Today, I have spent eight minutes with the patient with telehealth technology discussing diet, exercise, medication, reviewing cardiac symptoms. I spent greater than 20 minutes reviewing the patient's past medical history, cardiac test, and medications.   Medication Adjustments/Labs and Tests Ordered: Current medicines are reviewed at length with the patient today.  Concerns regarding medicines are outlined above.   Tests  Ordered: No orders of the defined types were placed in this encounter.  Medication Changes: No orders of the defined types were placed in this encounter.   Disposition:  in 1 year(s)  Signed, Jossie Ng. Ryane Canavan NP-C    11/12/2018 11:58 AM    [address] [place] [address] Office [phone] Fax [phone]

## 2020-04-21 ENCOUNTER — Encounter: Payer: Self-pay | Admitting: General Practice

## 2020-04-21 ENCOUNTER — Telehealth (INDEPENDENT_AMBULATORY_CARE_PROVIDER_SITE_OTHER): Payer: Medicare HMO | Admitting: General Practice

## 2020-04-21 VITALS — BP 142/65 | Ht 65.0 in | Wt 200.0 lb

## 2020-04-21 DIAGNOSIS — E782 Mixed hyperlipidemia: Secondary | ICD-10-CM

## 2020-04-21 DIAGNOSIS — I10 Essential (primary) hypertension: Secondary | ICD-10-CM

## 2020-04-21 DIAGNOSIS — R011 Cardiac murmur, unspecified: Secondary | ICD-10-CM | POA: Diagnosis not present

## 2020-04-21 NOTE — Patient Instructions (Signed)
Medication Instructions:  The current medical regimen is effective;  continue present plan and medications as directed. Please refer to the Current Medication list given to you today.  *If you need a refill on your cardiac medications before your next appointment, please call your pharmacy*  Lab Work:   Testing/Procedures:    NONE    NONE  Special Instructions TAKE AND LOG YOUR BLOOD PRESSURE AT LEAST 2 TIMES/MONTH AND IF YOU FEEL UNWELL  PLEASE READ AND FOLLOW SALTY 6-ATTACHED-1,800mg  daily  PLEASE INCREASE PHYSICAL ACTIVITY AS TOLERATED  Follow-Up: Your next appointment:  12 month(s) In Person with You may see Quay Burow, MD OR IF UNAVAILABLE JESSE CLEAVER, FNP-C or one of the following Advanced Practice Providers on your designated Care Team:  Kerin Ransom, PA-C  Sande Rives, [place]  Please call our office 2 months in advance to schedule this appointment   At [facility], you and your health needs are our priority.  As part of our continuing mission to provide you with exceptional heart care, we have created designated Provider Care Teams.  These Care Teams include your primary Cardiologist (physician) and Advanced Practice Providers (APPs -  Physician Assistants and Nurse Practitioners) who all work together to provide you with the care you need, when you need it.            6 SALTY THINGS TO AVOID     1,800MG  DAILY

## 2020-05-11 ENCOUNTER — Other Ambulatory Visit: Payer: Self-pay

## 2020-05-11 ENCOUNTER — Ambulatory Visit: Payer: Medicare HMO | Admitting: Hematology and Oncology

## 2020-05-13 ENCOUNTER — Other Ambulatory Visit: Payer: Self-pay

## 2020-05-13 ENCOUNTER — Ambulatory Visit: Payer: Medicare HMO | Admitting: Endocrinology

## 2020-05-13 VITALS — BP 180/80 | HR 80 | Ht 65.0 in | Wt 208.8 lb

## 2020-05-13 DIAGNOSIS — E119 Type 2 diabetes mellitus without complications: Secondary | ICD-10-CM | POA: Diagnosis not present

## 2020-05-13 LAB — BASIC METABOLIC PANEL
BUN: 19 mg/dL (ref 6–23)
CO2: 27 mEq/L (ref 19–32)
Calcium: 9.4 mg/dL (ref 8.4–10.5)
Chloride: 103 mEq/L (ref 96–112)
Creatinine, Ser: 0.86 mg/dL (ref 0.40–1.20)
GFR: 67.41 mL/min (ref >60.00)
Glucose, Bld: 188 mg/dL — ABNORMAL HIGH (ref 70–99)
Potassium: 4.4 mEq/L (ref 3.5–5.1)
Sodium: 138 mEq/L (ref 135–145)

## 2020-05-13 LAB — POCT GLYCOSYLATED HEMOGLOBIN (HGB A1C): Hemoglobin A1C: 7.7 % — AB (ref 4.0–5.6)

## 2020-05-13 LAB — TSH: TSH: 2.84 u[IU]/mL (ref 0.35–4.50)

## 2020-05-13 MED ORDER — RYBELSUS 3 MG PO TABS: 3.0000 mg | ORAL_TABLET | Freq: Every day | ORAL | 11 refills | Status: DC

## 2020-05-13 NOTE — Progress Notes (Signed)
Subjective:    Patient ID: [name], female    DOB: Oct 03, 1945, 75 y.o.   MRN: [id number]  HPI Pt returns for f/u of diabetes mellitus:  DM type: 2 Dx'ed: 3664 Complications: none Therapy: 3 oral meds GDM: 1985 DKA: never Severe hypoglycemia: never Pancreatitis: never Pancreatic imaging: never SDOH: she cannot afford brand name meds. Other: she has never been on insulin; edema limits rx options.  Interval history: pt states she feels well in general.   Pt says cbg's are well-controlled.  She takes meds as rx'ed.  Past Medical History:  Diagnosis Date  . Abnormal bone density screening   . Arthritis   . Cancer ([place]) 07/2017   left breast cancer  . Complication of anesthesia    "i wake up"  . Diverticulosis   . DM (diabetes mellitus) ([place])   . Family history of colon cancer   . Family history of ovarian cancer   . Family history of pancreatic cancer   . Family history of prostate cancer   . Family history of stomach cancer   . GERD (gastroesophageal reflux disease)   . History of radiation therapy 07/10/17- 08/06/17   40.05 Gy directed to the left breast in 15 fractions, followed by a boost of 10 Gy in 5 fractions.   Marland Kitchen HTN (hypertension)   . Hyperlipidemia   . Osteoporosis 06/15/2017  . Personal history of radiation therapy   . Poor compliance   . Vitamin D deficiency     Past Surgical History:  Procedure Laterality Date  . APPENDECTOMY    . BREAST BIOPSY Left 04/24/2017   malignant  . BREAST BIOPSY Left 04/24/2017   benign  . BREAST BIOPSY Left 04/25/2017   benign  . BREAST BIOPSY Left 04/2018   fibroadenoma w/ calcs  . BREAST LUMPECTOMY Left 05/30/2017  . BREAST LUMPECTOMY WITH RADIOACTIVE SEED LOCALIZATION Left 05/30/2017   Procedure: LEFT BREAST LUMPECTOMY WITH RADIOACTIVE SEED LOCALIZATION;  Surgeon: Erroll Luna, MD;  Location: [facility];  Service: General;  Laterality: Left;  . COLONOSCOPY  06/12/2005   Schooler/normal exam="i woke up during it"  .  DILATION AND CURETTAGE OF UTERUS    . FOREIGN BODY REMOVAL ESOPHAGEAL    . TONSILLECTOMY    . TUBAL LIGATION      Social History   Socioeconomic History  . Marital status: Married    Spouse name: Not on file  . Number of children: Not on file  . Years of education: Not on file  . Highest education level: Not on file  Occupational History  . Not on file  Tobacco Use  . Smoking status: Never Smoker  . Smokeless tobacco: Never Used  Vaping Use  . Vaping Use: Never used  Substance and Sexual Activity  . Alcohol use: No  . Drug use: No  . Sexual activity: Not Currently  Other Topics Concern  . Not on file  Social History Narrative  . Not on file   Social Determinants of Health   Financial Resource Strain: Not on file  Food Insecurity: Not on file  Transportation Needs: Not on file  Physical Activity: Not on file  Stress: Not on file  Social Connections: Not on file  Intimate Partner Violence: Not on file    Current Outpatient Medications on File Prior to Visit  Medication Sig Dispense Refill  . Accu-Chek Softclix Lancets lancets 1 each by Other route 2 (two) times daily. Used to check blood sugars twice daily. DX code E11.9.    Marland Kitchen  amLODipine (NORVASC) 10 MG tablet TAKE 1 TABLET BY MOUTH EVERY DAY 90 tablet 1  . aspirin EC 81 MG tablet Take 81 mg by mouth daily.    . Blood Glucose Monitoring Suppl (ACCU-CHEK AVIVA PLUS) w/Device KIT USE AS DIRECTED 2 TIMES DAILY 1 kit 0  . bromocriptine (PARLODEL) 2.5 MG tablet Take 0.5 tablets (1.25 mg total) by mouth at bedtime. 15 tablet 11  . calcium-vitamin D (OSCAL WITH D) 250-125 MG-UNIT tablet Take 1 tablet by mouth daily.    . carvedilol (COREG) 25 MG tablet TAKE 1 TABLET BY MOUTH TWICE A DAY 180 tablet 1  . glucose blood test strip Used to check blood sugars twice daily. DX code E11.9. 100 each 12  . losartan-hydrochlorothiazide (HYZAAR) 100-12.5 MG tablet TAKE 1 TABLET BY MOUTH EVERY DAY 90 tablet 3  . metFORMIN (GLUCOPHAGE)  1000 MG tablet TAKE 1 TABLET BY MOUTH TWICE A DAY WITH MEALS 180 tablet 1  . pravastatin (PRAVACHOL) 20 MG tablet TAKE 1 TABLET (20 MG TOTAL) BY MOUTH DAILY. 90 tablet 1  . PROLIA 60 MG/ML SOLN injection   0  . repaglinide (PRANDIN) 2 MG tablet TAKE 1 TABLET (2 MG TOTAL) BY MOUTH 2 (TWO) TIMES DAILY BEFORE A MEAL. 180 tablet 3  . tamoxifen (NOLVADEX) 20 MG tablet Take 1 tablet (20 mg total) by mouth daily. 90 tablet 3  . venlafaxine XR (EFFEXOR-XR) 37.5 MG 24 hr capsule TAKE 1 CAPSULE BY MOUTH DAILY WITH BREAKFAST. 90 capsule 0  . vitamin C (ASCORBIC ACID) 500 MG tablet Take 500 mg by mouth daily.     No current facility-administered medications on file prior to visit.    Allergies  Allergen Reactions  . Atorvastatin Other (See Comments)    dizziness  . Fosamax [Alendronate Sodium]     Cough,trouble shallowing, chest pain  . Lisinopril Cough  . Tetracycline Nausea And Vomiting    Family History  Problem Relation Age of Onset  . Colon cancer Mother 73  . Ovarian cancer Mother 44       'radium treatment'  . Cardiomyopathy Mother   . Colon polyps Mother   . Prostate cancer Maternal Uncle 62       metastatic 'died from it'  . Stomach cancer Maternal Grandmother 65  . Heart disease Maternal Grandmother   . Diabetes Maternal Grandfather   . Pancreatic cancer Maternal Aunt 66  . Prostate cancer Maternal Uncle 64       metastatic/ 'died from it'  . Cervical cancer Daughter   . Thyroid cancer Paternal Uncle   . Thyroid cancer Cousin   . Esophageal cancer Neg Hx   . Rectal cancer Neg Hx     BP (!) 180/80 (BP Location: Right Arm, Patient Position: Sitting, Cuff Size: Normal)   Pulse 80   Ht '5\' 5"'  (1.651 m)   Wt 208 lb 12.8 oz (94.7 kg)   SpO2 96%   BMI 34.75 kg/m    Review of Systems     Objective:   Physical Exam VITAL SIGNS:  See vs page GENERAL: no distress Pulses: dorsalis pedis intact bilat.   MSK: no deformity of the feet CV: 1+ bilat leg edema Skin:  no ulcer  on the feet.  normal color and temp on the feet. Neuro: sensation is intact to touch on the feet Ext: there is bilateral onychomycosis of the toenails.    Lab Results  Component Value Date   HGBA1C 7.7 (A) 05/13/2020  Assessment & Plan:  HTN: is noted today Type 2 DM: uncontrolled.  Patient Instructions  Your blood pressure is high today.  Please see your primary care provider soon, to have it rechecked.  I have sent a prescription to your pharmacy, to add "Rybelsus."  Please continue the same other medications for diabetes.  Blood tests are requested for you today.  We'll let you know about the results.  check your blood sugar once a day.  vary the time of day when you check, between before the 3 meals, and at bedtime.  also check if you have symptoms of your blood sugar being too high or too low.  please keep a record of the readings and bring it to your next appointment here (or you can bring the meter itself).  You can write it on any piece of paper.  please call us sooner if your blood sugar goes below 70, or if you have a lot of readings over 200.   Please come back for a follow-up appointment in 3 months.

## 2020-05-13 NOTE — Patient Instructions (Addendum)
Your blood pressure is high today.  Please see your primary care provider soon, to have it rechecked.  I have sent a prescription to your pharmacy, to add "Rybelsus."  Please continue the same other medications for diabetes.  Blood tests are requested for you today.  We'll let you know about the results.  check your blood sugar once a day.  vary the time of day when you check, between before the 3 meals, and at bedtime.  also check if you have symptoms of your blood sugar being too high or too low.  please keep a record of the readings and bring it to your next appointment here (or you can bring the meter itself).  You can write it on any piece of paper.  please call us sooner if your blood sugar goes below 70, or if you have a lot of readings over 200.   Please come back for a follow-up appointment in 3 months.

## 2020-05-17 NOTE — Progress Notes (Incomplete)
Patient Care Team: Girtha Rm, NP-C as PCP - General (Family Medicine) Lorretta Harp, MD as PCP - Cardiology (Cardiology) Nicholas Lose, MD as Consulting Physician (Hematology and Oncology) Eppie Gibson, MD as Attending Physician (Radiation Oncology) Erroll Luna, MD as Consulting Physician (General Surgery) Delice Bison Charlestine Massed, NP as Nurse Practitioner (Hematology and Oncology)  DIAGNOSIS: No diagnosis found.  SUMMARY OF ONCOLOGIC HISTORY: Oncology History  Ductal carcinoma in situ (DCIS) of left breast  04/24/2017 Initial Diagnosis   Screening detected left breast calcifications in 3 areas UOQ, anterior group of calcifications 1.4 cm biopsy revealed high-grade DCIS with calcifications ER 95%, PR 95%, other 2 areas benign fibrocystic changes, Tis N0 stage 0   05/30/2017 Surgery   Left Lumpectomy: DCIS Intermediate Grade with necrosis and calcs, 0.9 cm, Er: 95%, PR 95%, TisNx (Stage 0)   07/06/2017 Genetic Testing   The Common Hereditary Cancer Panel + Thyroid cancer Panel was ordered.  The following genes were evaluated for sequence changes and exonic deletions/duplications: APC, ATM, AXIN2, BARD1, BMPR1A, BRCA1, BRCA2, BRIP1, CDH1, CDK4, CDKN2A (p14ARF), CDKN2A (p16INK4a), CHEK2, CTNNA1, DICER1, EPCAM*, GREM1*, KIT, MEN1, MLH1, MSH2, MSH3, MSH6, MUTYH, NBN, NF1, PALB2, PDGFRA, PMS2, POLD1, POLE, PRKAR1A, PTEN, RAD50, RAD51C, RAD51D, RET, SDHB, SDHC, SDHD, SMAD4, SMARCA4, STK11, TP53, TSC1, TSC2, VHL.  The following genes were evaluated for sequence changes only: HOXB13*, NTHL1*, SDHA  Results: Negative, no pathogenic variants identified.  The date of this test report is 07/06/2017.   07/10/2017 - 08/06/2017 Radiation Therapy   Adjuvant radiation therapy   08/07/2017 -  Anti-estrogen oral therapy   Adjuvant antiestrogen therapy with tamoxifen 20 mg daily x5 years     CHIEF COMPLIANT: Follow-up of left breast DCIS on tamoxifen  INTERVAL HISTORY: [name] is a  75 y.o. with above-mentioned history of left breast DCISwhounderwent lumpectomy, radiation, and iscurrently onantiestrogen therapy with tamoxifen. Mammogram on 06/13/19 showed no evidence of malignancy bilaterally.She presents to the clinictoday for annual follow-up.   ALLERGIES:  is allergic to atorvastatin, fosamax [alendronate sodium], lisinopril, and tetracycline.  MEDICATIONS:  Current Outpatient Medications  Medication Sig Dispense Refill  . Accu-Chek Softclix Lancets lancets 1 each by Other route 2 (two) times daily. Used to check blood sugars twice daily. DX code E11.9.    Marland Kitchen amLODipine (NORVASC) 10 MG tablet TAKE 1 TABLET BY MOUTH EVERY DAY 90 tablet 1  . aspirin EC 81 MG tablet Take 81 mg by mouth daily.    . Blood Glucose Monitoring Suppl (ACCU-CHEK AVIVA PLUS) w/Device KIT USE AS DIRECTED 2 TIMES DAILY 1 kit 0  . bromocriptine (PARLODEL) 2.5 MG tablet Take 0.5 tablets (1.25 mg total) by mouth at bedtime. 15 tablet 11  . calcium-vitamin D (OSCAL WITH D) 250-125 MG-UNIT tablet Take 1 tablet by mouth daily.    . carvedilol (COREG) 25 MG tablet TAKE 1 TABLET BY MOUTH TWICE A DAY 180 tablet 1  . glucose blood test strip Used to check blood sugars twice daily. DX code E11.9. 100 each 12  . losartan-hydrochlorothiazide (HYZAAR) 100-12.5 MG tablet TAKE 1 TABLET BY MOUTH EVERY DAY 90 tablet 3  . metFORMIN (GLUCOPHAGE) 1000 MG tablet TAKE 1 TABLET BY MOUTH TWICE A DAY WITH MEALS 180 tablet 1  . pravastatin (PRAVACHOL) 20 MG tablet TAKE 1 TABLET (20 MG TOTAL) BY MOUTH DAILY. 90 tablet 1  . PROLIA 60 MG/ML SOLN injection   0  . repaglinide (PRANDIN) 2 MG tablet TAKE 1 TABLET (2 MG TOTAL) BY MOUTH 2 (  TWO) TIMES DAILY BEFORE A MEAL. 180 tablet 3  . Semaglutide (RYBELSUS) 3 MG TABS Take 3 mg by mouth daily. 30 tablet 11  . tamoxifen (NOLVADEX) 20 MG tablet Take 1 tablet (20 mg total) by mouth daily. 90 tablet 3  . venlafaxine XR (EFFEXOR-XR) 37.5 MG 24 hr capsule TAKE 1 CAPSULE BY MOUTH DAILY  WITH BREAKFAST. 90 capsule 0  . vitamin C (ASCORBIC ACID) 500 MG tablet Take 500 mg by mouth daily.     No current facility-administered medications for this visit.    PHYSICAL EXAMINATION: ECOG PERFORMANCE STATUS: {CHL ONC ECOG [phone]}  There were no vitals filed for this visit. There were no vitals filed for this visit.  BREAST:*** No palpable masses or nodules in either right or left breasts. No palpable axillary supraclavicular or infraclavicular adenopathy no breast tenderness or nipple discharge. (exam performed in the presence of a chaperone)  LABORATORY DATA:  I have reviewed the data as listed CMP Latest Ref Rng & Units 05/13/2020 04/29/2019 03/20/2019  Glucose 70 - 99 mg/dL 188(H) 117(H) 172(H)  BUN 6 - 23 mg/dL '19 14 19  ' Creatinine 0.40 - 1.20 mg/dL 0.86 0.75 0.92  Sodium 135 - 145 mEq/L 138 141 138  Potassium 3.5 - 5.1 mEq/L 4.4 4.6 4.7  Chloride 96 - 112 mEq/L 103 103 101  CO2 19 - 32 mEq/L '27 23 21  ' Calcium 8.4 - 10.5 mg/dL 9.4 8.9 10.4(H)  Total Protein 6.0 - 8.5 g/dL - 6.0 6.7  Total Bilirubin 0.0 - 1.2 mg/dL - 0.3 0.3  Alkaline Phos 39 - 117 IU/L - 44 55  AST 0 - 40 IU/L - 25 55(H)  ALT 0 - 32 IU/L - 21 31    Lab Results  Component Value Date   WBC 9.0 07/28/2019   HGB 12.2 07/28/2019   HCT 37.5 07/28/2019   MCV 90 07/28/2019   PLT 232 07/28/2019   NEUTROABS 6.3 07/28/2019    ASSESSMENT & PLAN:  No problem-specific Assessment & Plan notes found for this encounter.    No orders of the defined types were placed in this encounter.  The patient has a good understanding of the overall plan. she agrees with it. she will call with any problems that may develop before the next visit here.  Total time spent: *** mins including face to face time and time spent for planning, charting and coordination of care  Rulon Eisenmenger, MD, MPH 05/17/2020  I, Cloyde Reams Dorshimer, am acting as scribe for Dr. Nicholas Lose.  {insert scribe attestation}

## 2020-05-18 ENCOUNTER — Inpatient Hospital Stay: Payer: Medicare HMO | Admitting: Hematology and Oncology

## 2020-05-25 ENCOUNTER — Other Ambulatory Visit: Payer: Self-pay | Admitting: Cardiovascular Disease

## 2020-05-25 DIAGNOSIS — I10 Essential (primary) hypertension: Secondary | ICD-10-CM

## 2020-05-26 ENCOUNTER — Other Ambulatory Visit: Payer: Self-pay | Admitting: Endocrinology

## 2020-05-26 DIAGNOSIS — E1165 Type 2 diabetes mellitus with hyperglycemia: Secondary | ICD-10-CM

## 2020-05-26 NOTE — Progress Notes (Signed)
Patient Care Team: Girtha Rm, NP-C as PCP - General (Family Medicine) Lorretta Harp, MD as PCP - Cardiology (Cardiology) Nicholas Lose, MD as Consulting Physician (Hematology and Oncology) Eppie Gibson, MD as Attending Physician (Radiation Oncology) Erroll Luna, MD as Consulting Physician (General Surgery) Delice Bison Charlestine Massed, NP as Nurse Practitioner (Hematology and Oncology)  DIAGNOSIS:    ICD-10-CM   1. Ductal carcinoma in situ (DCIS) of left breast  D05.12     SUMMARY OF ONCOLOGIC HISTORY: Oncology History  Ductal carcinoma in situ (DCIS) of left breast  04/24/2017 Initial Diagnosis   Screening detected left breast calcifications in 3 areas UOQ, anterior group of calcifications 1.4 cm biopsy revealed high-grade DCIS with calcifications ER 95%, PR 95%, other 2 areas benign fibrocystic changes, Tis N0 stage 0   05/30/2017 Surgery   Left Lumpectomy: DCIS Intermediate Grade with necrosis and calcs, 0.9 cm, Er: 95%, PR 95%, TisNx (Stage 0)   07/06/2017 Genetic Testing   The Common Hereditary Cancer Panel + Thyroid cancer Panel was ordered.  The following genes were evaluated for sequence changes and exonic deletions/duplications: APC, ATM, AXIN2, BARD1, BMPR1A, BRCA1, BRCA2, BRIP1, CDH1, CDK4, CDKN2A (p14ARF), CDKN2A (p16INK4a), CHEK2, CTNNA1, DICER1, EPCAM*, GREM1*, KIT, MEN1, MLH1, MSH2, MSH3, MSH6, MUTYH, NBN, NF1, PALB2, PDGFRA, PMS2, POLD1, POLE, PRKAR1A, PTEN, RAD50, RAD51C, RAD51D, RET, SDHB, SDHC, SDHD, SMAD4, SMARCA4, STK11, TP53, TSC1, TSC2, VHL.  The following genes were evaluated for sequence changes only: HOXB13*, NTHL1*, SDHA  Results: Negative, no pathogenic variants identified.  The date of this test report is 07/06/2017.   07/10/2017 - 08/06/2017 Radiation Therapy   Adjuvant radiation therapy   08/07/2017 -  Anti-estrogen oral therapy   Adjuvant antiestrogen therapy with tamoxifen 20 mg daily x5 years     CHIEF COMPLIANT: Follow-up of left breast  DCIS on tamoxifen  INTERVAL HISTORY: [name] is a 75 y.o. with above-mentioned history of left breast DCISwhounderwent lumpectomy, radiation, and iscurrently onantiestrogen therapy with tamoxifen.Mammogram on 06/13/19 showed no evidence of malignancy bilaterally. She presents to the clinictoday for annual follow-up.   ALLERGIES:  is allergic to atorvastatin, fosamax [alendronate sodium], lisinopril, and tetracycline.  MEDICATIONS:  Current Outpatient Medications  Medication Sig Dispense Refill  . Accu-Chek Softclix Lancets lancets 1 each by Other route 2 (two) times daily. Used to check blood sugars twice daily. DX code E11.9.    Marland Kitchen amLODipine (NORVASC) 10 MG tablet TAKE 1 TABLET BY MOUTH EVERY DAY 90 tablet 1  . aspirin EC 81 MG tablet Take 81 mg by mouth daily.    . Blood Glucose Monitoring Suppl (ACCU-CHEK AVIVA PLUS) w/Device KIT USE AS DIRECTED 2 TIMES DAILY 1 kit 0  . bromocriptine (PARLODEL) 2.5 MG tablet Take 0.5 tablets (1.25 mg total) by mouth at bedtime. 15 tablet 11  . calcium-vitamin D (OSCAL WITH D) 250-125 MG-UNIT tablet Take 1 tablet by mouth daily.    . carvedilol (COREG) 25 MG tablet TAKE 1 TABLET BY MOUTH TWICE A DAY 180 tablet 1  . glucose blood test strip Used to check blood sugars twice daily. DX code E11.9. 100 each 12  . losartan-hydrochlorothiazide (HYZAAR) 100-12.5 MG tablet TAKE 1 TABLET BY MOUTH EVERY DAY 90 tablet 0  . metFORMIN (GLUCOPHAGE) 1000 MG tablet TAKE 1 TABLET BY MOUTH TWICE A DAY WITH MEALS 180 tablet 1  . pravastatin (PRAVACHOL) 20 MG tablet TAKE 1 TABLET (20 MG TOTAL) BY MOUTH DAILY. 90 tablet 1  . PROLIA 60 MG/ML SOLN injection  0  . repaglinide (PRANDIN) 2 MG tablet TAKE 1 TABLET (2 MG TOTAL) BY MOUTH 2 (TWO) TIMES DAILY BEFORE A MEAL. 180 tablet 3  . Semaglutide (RYBELSUS) 3 MG TABS Take 3 mg by mouth daily. 30 tablet 11  . tamoxifen (NOLVADEX) 20 MG tablet Take 1 tablet (20 mg total) by mouth daily. 90 tablet 3  . venlafaxine XR  (EFFEXOR-XR) 37.5 MG 24 hr capsule TAKE 1 CAPSULE BY MOUTH DAILY WITH BREAKFAST. 90 capsule 0  . vitamin C (ASCORBIC ACID) 500 MG tablet Take 500 mg by mouth daily.     No current facility-administered medications for this visit.    PHYSICAL EXAMINATION: ECOG PERFORMANCE STATUS: 1 - Symptomatic but completely ambulatory  Vitals:   05/27/20 1547  BP: (!) 140/57  Pulse: 77  Resp: 17  Temp: (!) 97.5 F (36.4 C)  SpO2: 96%   Filed Weights   05/27/20 1547  Weight: 203 lb 4.8 oz (92.2 kg)    BREAST: No palpable masses or nodules in either right or left breasts. No palpable axillary supraclavicular or infraclavicular adenopathy no breast tenderness or nipple discharge. (exam performed in the presence of a chaperone)  LABORATORY DATA:  I have reviewed the data as listed CMP Latest Ref Rng & Units 05/13/2020 04/29/2019 03/20/2019  Glucose 70 - 99 mg/dL 188(H) 117(H) 172(H)  BUN 6 - 23 mg/dL '19 14 19  ' Creatinine 0.40 - 1.20 mg/dL 0.86 0.75 0.92  Sodium 135 - 145 mEq/L 138 141 138  Potassium 3.5 - 5.1 mEq/L 4.4 4.6 4.7  Chloride 96 - 112 mEq/L 103 103 101  CO2 19 - 32 mEq/L '27 23 21  ' Calcium 8.4 - 10.5 mg/dL 9.4 8.9 10.4(H)  Total Protein 6.0 - 8.5 g/dL - 6.0 6.7  Total Bilirubin 0.0 - 1.2 mg/dL - 0.3 0.3  Alkaline Phos 39 - 117 IU/L - 44 55  AST 0 - 40 IU/L - 25 55(H)  ALT 0 - 32 IU/L - 21 31    Lab Results  Component Value Date   WBC 9.0 07/28/2019   HGB 12.2 07/28/2019   HCT 37.5 07/28/2019   MCV 90 07/28/2019   PLT 232 07/28/2019   NEUTROABS 6.3 07/28/2019    ASSESSMENT & PLAN:  Ductal carcinoma in situ (DCIS) of left breast 05/30/17:Left Lumpectomy: DCIS Intermediate Grade with necrosis and calcs, 0.9 cm, Er: 95%, PR 95%, TisNx (Stage 0) Adj RT:07/10/2017-08/06/2017  Plan: Adj Tamoxifen 20 mg daily X 5 yearsstarted 08/07/2017 Tamoxifen toxicities:No side effects from tamoxifen  Breast cancer surveillance: 1.Breast exam 05/27/20: Benign 2.Mammogram 06/13/19:  Stable findings  Her husband died last 12/13/22 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  Return to clinic in 1 year for follow-up     No orders of the defined types were placed in this encounter.  The patient has a good understanding of the overall plan. she agrees with it. she will call with any problems that may develop before the next visit here.  Total time spent: 20 mins including face to face time and time spent for planning, charting and coordination of care  Rulon Eisenmenger, MD, MPH 05/27/2020  I, Cloyde Reams Dorshimer, am acting as scribe for Dr. Nicholas Lose.  I have reviewed the above documentation for accuracy and completeness, and I agree with the above.

## 2020-05-26 NOTE — Assessment & Plan Note (Signed)
05/30/17:Left Lumpectomy: DCIS Intermediate Grade with necrosis and calcs, 0.9 cm, Er: 95%, PR 95%, TisNx (Stage 0) Adj RT:07/10/2017-08/06/2017  Plan: Adj Tamoxifen 20 mg daily X 5 yearsstarted 08/07/2017 Tamoxifen toxicities:Initially she had mild hot flashes but that resolved, denies any muscle cramps  Breast cancer surveillance: 1.Breast exam 05/27/20: Benign 2.Mammogram 06/13/19: Stable findings  She does take care of her husband who has multiple chronic health issues. 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  Return to clinic in 1 year for follow-up

## 2020-05-27 ENCOUNTER — Other Ambulatory Visit: Payer: Self-pay

## 2020-05-27 ENCOUNTER — Telehealth: Payer: Self-pay | Admitting: Hematology and Oncology

## 2020-05-27 ENCOUNTER — Inpatient Hospital Stay: Payer: Medicare HMO | Attending: Hematology and Oncology | Admitting: Hematology and Oncology

## 2020-05-27 DIAGNOSIS — Z170 Estrogen receptor positive status [ER+]: Secondary | ICD-10-CM | POA: Diagnosis not present

## 2020-05-27 DIAGNOSIS — Z79810 Long term (current) use of selective estrogen receptor modulators (SERMs): Secondary | ICD-10-CM | POA: Insufficient documentation

## 2020-05-27 DIAGNOSIS — D0512 Intraductal carcinoma in situ of left breast: Secondary | ICD-10-CM | POA: Diagnosis not present

## 2020-05-27 DIAGNOSIS — Z923 Personal history of irradiation: Secondary | ICD-10-CM | POA: Insufficient documentation

## 2020-05-27 MED ORDER — TAMOXIFEN CITRATE 20 MG PO TABS: 20.0000 mg | ORAL_TABLET | Freq: Every day | ORAL | 3 refills | Status: DC

## 2020-05-27 NOTE — Telephone Encounter (Signed)
Scheduled appts per 2/17 los. Gave pt an appt reminder card.

## 2020-06-14 ENCOUNTER — Other Ambulatory Visit: Payer: Self-pay | Admitting: Cardiovascular Disease

## 2020-06-14 ENCOUNTER — Other Ambulatory Visit: Payer: Self-pay | Admitting: Family Medicine

## 2020-06-14 DIAGNOSIS — I10 Essential (primary) hypertension: Secondary | ICD-10-CM

## 2020-06-14 DIAGNOSIS — F419 Anxiety disorder, unspecified: Secondary | ICD-10-CM

## 2020-06-14 NOTE — Telephone Encounter (Signed)
[facility] is requesting to fill pt effexor. Please advise [facility]

## 2020-07-06 ENCOUNTER — Other Ambulatory Visit (HOSPITAL_COMMUNITY): Payer: Self-pay

## 2020-07-15 ENCOUNTER — Encounter: Payer: Self-pay | Admitting: Family Medicine

## 2020-07-15 MED ORDER — PRAVASTATIN SODIUM 20 MG PO TABS: 20.0000 mg | ORAL_TABLET | Freq: Every day | ORAL | 0 refills | Status: DC

## 2020-07-19 ENCOUNTER — Other Ambulatory Visit (HOSPITAL_COMMUNITY): Payer: Self-pay

## 2020-07-28 NOTE — Progress Notes (Signed)
[name] is a 75 y.o. female who presents for annual wellness visit and follow-up on chronic medical conditions.  She has the following concerns:  States her husband passed away in 12-18-19 with bladder cancer.  States she went through some difficult times and for a while she only went to work and slept.  States now she is getting back out in the world and is doing better. She is taking Effexor for anxiety and depression and states she feels good on this medication.  Osteoporosis- taking Prolia and doing well. Vitamin D supplement  Calcium in diet  HL and aortic atherosclerosis-  taking pravastatin and no concerns.  Asymptomatic.  Under the care of cardiology.  HTN- doing well on medications. Managed by Dr. Gwenlyn Found   Had breast exam in February with Dr. Lindi Adie. Will call to schedule mammogram. Hx of breast cancer. 3 years cancer free.    Immunization History  Administered Date(s) Administered  . Fluad Quad(high Dose 65+) 03/20/2019, 01/28/2020  . Influenza, High Dose Seasonal PF 01/07/2018  . Pneumococcal Conjugate-13 02/05/2017  . Pneumococcal Polysaccharide-23 04/24/2018   Last Pap smear: declines Last mammogram:  06/2019 Last colonoscopy: 2019 Last DEXA: 5/21  Dentist: getting bottom dentures soon Ophtho: 2 years ago  Exercise: walking 5 days per week   Other doctors caring for patient include: Dr. Loanne Drilling- Diabetes, Dr. Lindi Adie- oncology  Dr. Gwenlyn Found- Cardiology  Depression screen:  See questionnaire below.  Depression screen [facility] 2/9 07/29/2020 07/28/2019 04/29/2019 04/24/2018 09/07/2017  Decreased Interest 0 0 0 3 0  Down, Depressed, Hopeless 0 0 0 3 0  PHQ - 2 Score 0 0 0 6 0    Fall Risk Screen: see questionnaire below. Fall Risk  07/29/2020 07/28/2019 04/29/2019 04/24/2018 09/07/2017  Falls in the past year? 0 0 0 0 No  Number falls in past yr: 0 0 0 0 -  Injury with Fall? 0 0 0 0 -  Risk for fall due to : No Fall Risks - - - -  Follow up Falls evaluation completed  - - - -    ADL screen:  See questionnaire below Functional Status Survey: Is the patient deaf or have difficulty hearing?: No Does the patient have difficulty seeing, even when wearing glasses/contacts?: No Does the patient have difficulty concentrating, remembering, or making decisions?: No Does the patient have difficulty walking or climbing stairs?: No Does the patient have difficulty dressing or bathing?: No Does the patient have difficulty doing errands alone such as visiting a doctor's office or shopping?: No   End of Life Discussion:  Patient does not have a living will and medical power of attorney. Her daughter [name] would make her medical decisions.   Review of Systems Constitutional: -fever, -chills, -sweats, -unexpected weight change, -anorexia, -fatigue Allergy: -sneezing, -itching, -congestion Dermatology: denies changing moles, rash, lumps, new worrisome lesions ENT: -runny nose, -ear pain, -sore throat, -hoarseness, -sinus pain, -teeth pain, -tinnitus, -hearing loss, -epistaxis Cardiology:  -chest pain, -palpitations, -edema, -orthopnea, -paroxysmal nocturnal dyspnea Respiratory: -cough, -shortness of breath, -dyspnea on exertion, -wheezing, -hemoptysis Gastroenterology: -abdominal pain, -nausea, -vomiting, -diarrhea, -constipation, -blood in stool, -changes in bowel movement, -dysphagia Hematology: -bleeding or bruising problems Musculoskeletal: -arthralgias, -myalgias, -joint swelling, -back pain, -neck pain, -cramping, -gait changes Ophthalmology: -vision changes, -eye redness, -itching, -discharge Urology: -dysuria, -difficulty urinating, -hematuria, -urinary frequency, -urgency, incontinence Neurology: -headache, -weakness, -tingling, -numbness, -speech abnormality, -memory loss, -falls, -dizziness Psychology:  -depressed mood, -agitation, -sleep problems    PHYSICAL EXAM:  BP  124/64   Pulse 61   Ht 5' 5.75" (1.67 m)   Wt 194 lb 3.2 oz (88.1 kg)   BMI  31.58 kg/m   General Appearance: Alert, cooperative, no distress, appears stated age Head: Normocephalic, without obvious abnormality, atraumatic Eyes: PERRL, conjunctiva/corneas clear, EOM's intact Ears: Normal TM's and external ear canals Nose: mask on  Throat: mask on  Neck: Supple, no lymphadenopathy; thyroid: no enlargement/tenderness/nodules; no JVD Back: Spine nontender, no curvature, ROM normal, no CVA tenderness Lungs: Clear to auscultation bilaterally without wheezes, rales or ronchi; respirations unlabored Chest Wall: No tenderness or deformity Heart: Regular rate and rhythm Breast Exam: declines  Abdomen: Soft, non-tender, nondistended, normoactive bowel sounds, no masses, no hepatosplenomegaly Genitalia: declines  Extremities: No clubbing, cyanosis or edema Pulses: 2+ and symmetric all extremities Skin: Skin color, texture, turgor normal, no rashes or lesions Lymph nodes: Cervical, supraclavicular, and axillary nodes normal Neurologic: CNII-XII intact, normal strength, sensation and gait Psych: Normal mood, affect, hygiene and grooming.  ASSESSMENT/PLAN: Medicare annual wellness visit, subsequent -Denies difficulties with ADLs, memory or depression.  No falls.  States her balance has improved since last year and that physical therapy did help.  Advance directive counseling done.  Reviewed all medications  Routine general medical examination at a health care facility - Plan: CBC with Differential/Platelet, Comprehensive metabolic panel, POCT Urinalysis DIP (Proadvantage Device) -Preventive health care reviewed.  She will call and schedule her mammogram.  Declines breast and pelvic exam today.  Counseling on healthy lifestyle including diet and exercise.  Her mood is good and she denies any depression or anxiety.  Recommend regular dental and eye exams.  She will be getting lower dentures soon.  Discussed safety and health promotion.  Age-related osteoporosis without current  pathological fracture - Plan: VITAMIN D 25 Hydroxy (Vit-D Deficiency, Fractures) -Doing well with Prolia injections.  She will continue these.  Reviewed DEXA which showed improvement.  Continue getting adequate calcium in her diet and vitamin D supplement.  Continue walking daily.  Estrogen deficiency   Essential hypertension -Blood pressure controlled.  Continue medications and follow-up with Dr. Gwenlyn Found  Controlled type 2 diabetes mellitus without complication, without long-term current use of insulin ([place]) - Plan: TSH, Microalbumin / creatinine urine ratio -Reports blood sugars are improving and this is managed by Dr. Loanne Drilling.  Recommend she call and schedule diabetic eye exam.  Unable to provide a urine today so she will follow-up with a urine specimen.  Hyperlipidemia associated with type 2 diabetes mellitus ([place]) - Plan: Lipid panel -Continue statin therapy.  Recommend low fat diet.  Continue walking for exercise.  Check lipid panel and follow-up  Anxiety -Stable.  Continue Effexor  Advance directive discussed with patient -MOST form reviewed and filled out.  Paperwork including living will and healthcare power of attorney provided and discussed.  COVID-19 vaccination declined  History of breast cancer -She is followed by Dr. Lindi Adie.  She will call and schedule her mammogram  Vitamin D deficiency - Plan: VITAMIN D 25 Hydroxy (Vit-D Deficiency, Fractures) -Continue vitamin D supplement.  Check vitamin D level and adjust dose as appropriate.  Aortic atherosclerosis ([place]) - Plan: Lipid panel -Continue statin therapy.  She is asymptomatic.  Continue following with cardiology.     Discussed monthly self breast exams and yearly mammograms; at least 30 minutes of aerobic activity at least 5 days/week and weight-bearing exercise 2x/week; proper sunscreen use reviewed; healthy diet, including goals of calcium and vitamin D intake and alcohol recommendations (less than  or equal to 1  drink/day) reviewed; regular seatbelt use; changing batteries in smoke detectors.  Immunization recommendations discussed.  Colonoscopy recommendations reviewed   Medicare Attestation I have personally reviewed: The patient's medical and social history Their use of alcohol, tobacco or illicit drugs Their current medications and supplements The patient's functional ability including ADLs,fall risks, home safety risks, cognitive, and hearing and visual impairment Diet and physical activities Evidence for depression or mood disorders  The patient's weight, height, and BMI have been recorded in the chart.  I have made referrals, counseling, and provided education to the patient based on review of the above and I have provided the patient with a written personalized care plan for preventive services.     [name], NP-C   07/29/2020

## 2020-07-29 ENCOUNTER — Ambulatory Visit (INDEPENDENT_AMBULATORY_CARE_PROVIDER_SITE_OTHER): Payer: Medicare HMO | Admitting: Family Medicine

## 2020-07-29 ENCOUNTER — Encounter: Payer: Self-pay | Admitting: Family Medicine

## 2020-07-29 ENCOUNTER — Other Ambulatory Visit: Payer: Self-pay

## 2020-07-29 VITALS — BP 124/64 | HR 61 | Ht 65.75 in | Wt 194.2 lb

## 2020-07-29 DIAGNOSIS — Z853 Personal history of malignant neoplasm of breast: Secondary | ICD-10-CM

## 2020-07-29 DIAGNOSIS — E119 Type 2 diabetes mellitus without complications: Secondary | ICD-10-CM

## 2020-07-29 DIAGNOSIS — I10 Essential (primary) hypertension: Secondary | ICD-10-CM | POA: Diagnosis not present

## 2020-07-29 DIAGNOSIS — E559 Vitamin D deficiency, unspecified: Secondary | ICD-10-CM

## 2020-07-29 DIAGNOSIS — E785 Hyperlipidemia, unspecified: Secondary | ICD-10-CM

## 2020-07-29 DIAGNOSIS — E1169 Type 2 diabetes mellitus with other specified complication: Secondary | ICD-10-CM | POA: Diagnosis not present

## 2020-07-29 DIAGNOSIS — M810 Age-related osteoporosis without current pathological fracture: Secondary | ICD-10-CM | POA: Diagnosis not present

## 2020-07-29 DIAGNOSIS — I700 Atherosclerosis of aorta: Secondary | ICD-10-CM | POA: Diagnosis not present

## 2020-07-29 DIAGNOSIS — Z7189 Other specified counseling: Secondary | ICD-10-CM

## 2020-07-29 DIAGNOSIS — E2839 Other primary ovarian failure: Secondary | ICD-10-CM

## 2020-07-29 DIAGNOSIS — Z0000 Encounter for general adult medical examination without abnormal findings: Secondary | ICD-10-CM

## 2020-07-29 DIAGNOSIS — Z2821 Immunization not carried out because of patient refusal: Secondary | ICD-10-CM | POA: Diagnosis not present

## 2020-07-29 DIAGNOSIS — F419 Anxiety disorder, unspecified: Secondary | ICD-10-CM

## 2020-07-29 NOTE — Patient Instructions (Addendum)
  Ms. Oesterling , Thank you for taking time to come for your Medicare Wellness Visit. I appreciate your ongoing commitment to your health goals. Please review the following plan we discussed and let me know if I can assist you in the future.   These are the goals we discussed: Goals    . Blood Pressure < 130/80       Call and schedule a diabetic eye exam.   Call and schedule a mammogram.   Check with the pharmacy regarding getting your Tdap (tetanus) and Shingles vaccines.      This is a list of the screening recommended for you and due dates:  Health Maintenance  Topic Date Due  . COVID-19 Vaccine (1) Never done  . Tetanus Vaccine  Never done  . Eye exam for diabetics  03/03/2019  . Flu Shot  11/08/2020  . Hemoglobin A1C  11/10/2020  . Complete foot exam   05/13/2021  . Mammogram  06/12/2021  . Colon Cancer Screening  01/05/2028  . DEXA scan (bone density measurement)  Completed  .  Hepatitis C: One time screening is recommended by Center for Disease Control  (CDC) for  adults born from [birth year] through 1963.   Completed  . Pneumonia vaccines  Completed  . HPV Vaccine  Aged Out

## 2020-07-30 LAB — COMPREHENSIVE METABOLIC PANEL
ALT: 20 IU/L (ref 0–32)
AST: 28 IU/L (ref 0–40)
Albumin/Globulin Ratio: 1.6 (ref 1.2–2.2)
Albumin: 4 g/dL (ref 3.7–4.7)
Alkaline Phosphatase: 47 IU/L (ref 44–121)
BUN/Creatinine Ratio: 22 (ref 12–28)
BUN: 20 mg/dL (ref 8–27)
Bilirubin Total: 0.3 mg/dL (ref 0.0–1.2)
CO2: 20 mmol/L (ref 20–29)
Calcium: 9.5 mg/dL (ref 8.7–10.3)
Chloride: 99 mmol/L (ref 96–106)
Creatinine, Ser: 0.9 mg/dL (ref 0.57–1.00)
Globulin, Total: 2.5 g/dL (ref 1.5–4.5)
Glucose: 146 mg/dL — ABNORMAL HIGH (ref 65–99)
Potassium: 4.9 mmol/L (ref 3.5–5.2)
Sodium: 140 mmol/L (ref 134–144)
Total Protein: 6.5 g/dL (ref 6.0–8.5)
eGFR: 68 mL/min/{1.73_m2} (ref >59)

## 2020-07-30 LAB — CBC WITH DIFFERENTIAL/PLATELET
Basophils Absolute: 0.1 10*3/uL (ref 0.0–0.2)
Basos: 1 %
EOS (ABSOLUTE): 0.4 10*3/uL (ref 0.0–0.4)
Eos: 4 %
Hematocrit: 38 % (ref 34.0–46.6)
Hemoglobin: 12.4 g/dL (ref 11.1–15.9)
Immature Grans (Abs): 0.1 10*3/uL (ref 0.0–0.1)
Immature Granulocytes: 1 %
Lymphocytes Absolute: 2.2 10*3/uL (ref 0.7–3.1)
Lymphs: 20 %
MCH: 29 pg (ref 26.6–33.0)
MCHC: 32.6 g/dL (ref 31.5–35.7)
MCV: 89 fL (ref 79–97)
Monocytes Absolute: 0.8 10*3/uL (ref 0.1–0.9)
Monocytes: 8 %
Neutrophils Absolute: 7.1 10*3/uL — ABNORMAL HIGH (ref 1.4–7.0)
Neutrophils: 66 %
Platelets: 336 10*3/uL (ref 150–450)
RBC: 4.27 x10E6/uL (ref 3.77–5.28)
RDW: 13.2 % (ref 11.7–15.4)
WBC: 10.6 10*3/uL (ref 3.4–10.8)

## 2020-07-30 LAB — VITAMIN D 25 HYDROXY (VIT D DEFICIENCY, FRACTURES): Vit D, 25-Hydroxy: 37 ng/mL (ref 30.0–100.0)

## 2020-07-30 LAB — LIPID PANEL
Chol/HDL Ratio: 3.7 ratio (ref 0.0–4.4)
Cholesterol, Total: 142 mg/dL (ref 100–199)
HDL: 38 mg/dL — ABNORMAL LOW (ref >39)
LDL Chol Calc (NIH): 73 mg/dL (ref 0–99)
Triglycerides: 186 mg/dL — ABNORMAL HIGH (ref 0–149)
VLDL Cholesterol Cal: 31 mg/dL (ref 5–40)

## 2020-07-30 LAB — TSH: TSH: 2.3 u[IU]/mL (ref 0.450–4.500)

## 2020-08-05 ENCOUNTER — Other Ambulatory Visit: Payer: Self-pay

## 2020-08-05 ENCOUNTER — Other Ambulatory Visit: Payer: Medicare HMO

## 2020-08-05 DIAGNOSIS — M810 Age-related osteoporosis without current pathological fracture: Secondary | ICD-10-CM | POA: Diagnosis not present

## 2020-08-05 MED ORDER — DENOSUMAB 60 MG/ML ~~LOC~~ SOSY
60.0000 mg | PREFILLED_SYRINGE | Freq: Once | SUBCUTANEOUS | Status: AC
  Administered 2020-08-05: 60 mg via SUBCUTANEOUS

## 2020-08-07 ENCOUNTER — Other Ambulatory Visit: Payer: Self-pay | Admitting: Family Medicine

## 2020-08-11 ENCOUNTER — Other Ambulatory Visit: Payer: Self-pay | Admitting: Family Medicine

## 2020-08-11 ENCOUNTER — Other Ambulatory Visit: Payer: Self-pay

## 2020-08-11 ENCOUNTER — Ambulatory Visit (INDEPENDENT_AMBULATORY_CARE_PROVIDER_SITE_OTHER): Payer: Medicare HMO | Admitting: Endocrinology

## 2020-08-11 VITALS — BP 140/70 | HR 77 | Ht 65.75 in | Wt 195.4 lb

## 2020-08-11 DIAGNOSIS — E119 Type 2 diabetes mellitus without complications: Secondary | ICD-10-CM

## 2020-08-11 DIAGNOSIS — Z1231 Encounter for screening mammogram for malignant neoplasm of breast: Secondary | ICD-10-CM

## 2020-08-11 LAB — POCT GLYCOSYLATED HEMOGLOBIN (HGB A1C): Hemoglobin A1C: 6.3 % — AB (ref 4.0–5.6)

## 2020-08-11 MED ORDER — RYBELSUS 7 MG PO TABS: 7.0000 mg | ORAL_TABLET | Freq: Every day | ORAL | 3 refills | Status: DC

## 2020-08-11 NOTE — Progress Notes (Signed)
Subjective:    Patient ID: [name], female    DOB: May 04, 1945, 75 y.o.   MRN: [id number]  HPI Pt returns for f/u of diabetes mellitus:  DM type: 2 Dx'ed: 1962 Complications: none.   Therapy: 4 oral meds.   GDM: 1985 DKA: never Severe hypoglycemia: never   Pancreatitis: never Pancreatic imaging: never SDOH: none Other: she has never been on insulin; edema limits rx options.  Interval history: pt states she feels well in general.   Pt says cbg's vary from 125-174.  She takes meds as rx'ed.  She requests to reduce # of meds.   Past Medical History:  Diagnosis Date  . Abnormal bone density screening   . Arthritis   . Cancer ([place]) 07/2017   left breast cancer  . Complication of anesthesia    "i wake up"  . Diverticulosis   . DM (diabetes mellitus) ([place])   . Family history of colon cancer   . Family history of ovarian cancer   . Family history of pancreatic cancer   . Family history of prostate cancer   . Family history of stomach cancer   . GERD (gastroesophageal reflux disease)   . History of radiation therapy 07/10/17- 08/06/17   40.05 Gy directed to the left breast in 15 fractions, followed by a boost of 10 Gy in 5 fractions.   Marland Kitchen HTN (hypertension)   . Hyperlipidemia   . Osteoporosis 06/15/2017  . Personal history of radiation therapy   . Poor compliance   . Vitamin D deficiency     Past Surgical History:  Procedure Laterality Date  . APPENDECTOMY    . BREAST BIOPSY Left 04/24/2017   malignant  . BREAST BIOPSY Left 04/24/2017   benign  . BREAST BIOPSY Left 04/25/2017   benign  . BREAST BIOPSY Left 04/2018   fibroadenoma w/ calcs  . BREAST LUMPECTOMY Left 05/30/2017  . BREAST LUMPECTOMY WITH RADIOACTIVE SEED LOCALIZATION Left 05/30/2017   Procedure: LEFT BREAST LUMPECTOMY WITH RADIOACTIVE SEED LOCALIZATION;  Surgeon: Erroll Luna, MD;  Location: [facility];  Service: General;  Laterality: Left;  . COLONOSCOPY  06/12/2005   Schooler/normal exam="i woke up  during it"  . DILATION AND CURETTAGE OF UTERUS    . FOREIGN BODY REMOVAL ESOPHAGEAL    . TONSILLECTOMY    . TUBAL LIGATION      Social History   Socioeconomic History  . Marital status: Married    Spouse name: Not on file  . Number of children: Not on file  . Years of education: Not on file  . Highest education level: Not on file  Occupational History  . Not on file  Tobacco Use  . Smoking status: Never Smoker  . Smokeless tobacco: Never Used  Vaping Use  . Vaping Use: Never used  Substance and Sexual Activity  . Alcohol use: No  . Drug use: No  . Sexual activity: Not Currently  Other Topics Concern  . Not on file  Social History Narrative  . Not on file   Social Determinants of Health   Financial Resource Strain: Not on file  Food Insecurity: Not on file  Transportation Needs: Not on file  Physical Activity: Not on file  Stress: Not on file  Social Connections: Not on file  Intimate Partner Violence: Not on file    Current Outpatient Medications on File Prior to Visit  Medication Sig Dispense Refill  . Accu-Chek Softclix Lancets lancets 1 each by Other route 2 (two) times  daily. Used to check blood sugars twice daily. DX code E11.9.    Marland Kitchen amLODipine (NORVASC) 10 MG tablet TAKE 1 TABLET BY MOUTH EVERY DAY 90 tablet 3  . aspirin EC 81 MG tablet Take 81 mg by mouth daily.    . Blood Glucose Monitoring Suppl (ACCU-CHEK AVIVA PLUS) w/Device KIT USE AS DIRECTED 2 TIMES DAILY 1 kit 0  . calcium-vitamin D (OSCAL WITH D) 250-125 MG-UNIT tablet Take 1 tablet by mouth daily.    . carvedilol (COREG) 25 MG tablet TAKE 1 TABLET BY MOUTH TWICE A DAY 180 tablet 3  . denosumab (PROLIA) 60 MG/ML SOSY injection TO BE ADMINISTERED AT THE OFFICE EVERY 6 MONTHS 1 mL 0  . glucose blood test strip Used to check blood sugars twice daily. DX code E11.9. 100 each 12  . losartan-hydrochlorothiazide (HYZAAR) 100-12.5 MG tablet TAKE 1 TABLET BY MOUTH EVERY DAY 90 tablet 0  . metFORMIN  (GLUCOPHAGE) 1000 MG tablet TAKE 1 TABLET BY MOUTH TWICE A DAY WITH MEALS 180 tablet 1  . pravastatin (PRAVACHOL) 20 MG tablet TAKE 1 TABLET BY MOUTH EVERY DAY 30 tablet 5  . PROLIA 60 MG/ML SOLN injection   0  . repaglinide (PRANDIN) 2 MG tablet TAKE 1 TABLET (2 MG TOTAL) BY MOUTH 2 (TWO) TIMES DAILY BEFORE A MEAL. 180 tablet 3  . tamoxifen (NOLVADEX) 20 MG tablet Take 1 tablet (20 mg total) by mouth daily. 90 tablet 3  . venlafaxine XR (EFFEXOR-XR) 37.5 MG 24 hr capsule TAKE 1 CAPSULE BY MOUTH DAILY WITH BREAKFAST. 90 capsule 0  . vitamin C (ASCORBIC ACID) 500 MG tablet Take 500 mg by mouth daily.     No current facility-administered medications on file prior to visit.    Allergies  Allergen Reactions  . Atorvastatin Other (See Comments)    dizziness  . Fosamax [Alendronate Sodium]     Cough,trouble shallowing, chest pain  . Lisinopril Cough  . Tetracycline Nausea And Vomiting    Family History  Problem Relation Age of Onset  . Colon cancer Mother 37  . Ovarian cancer Mother 8       'radium treatment'  . Cardiomyopathy Mother   . Colon polyps Mother   . Prostate cancer Maternal Uncle 67       metastatic 'died from it'  . Stomach cancer Maternal Grandmother 28  . Heart disease Maternal Grandmother   . Diabetes Maternal Grandfather   . Pancreatic cancer Maternal Aunt 66  . Prostate cancer Maternal Uncle 33       metastatic/ 'died from it'  . Cervical cancer Daughter   . Thyroid cancer Paternal Uncle   . Thyroid cancer Cousin   . Esophageal cancer Neg Hx   . Rectal cancer Neg Hx     BP 140/70 (BP Location: Right Arm, Patient Position: Sitting, Cuff Size: Large)   Pulse 77   Ht 5' 5.75" (1.67 m)   Wt 195 lb 6.4 oz (88.6 kg)   SpO2 97%   BMI 31.78 kg/m    Review of Systems She denies hypoglycemia.      Objective:   Physical Exam VITAL SIGNS:  See vs page GENERAL: no distress Pulses: dorsalis pedis intact bilat.   MSK: no deformity of the feet CV: 1+ bilat  leg edema Skin:  no ulcer on the feet.  normal color and temp on the feet. Neuro: sensation is intact to touch on the feet.   Ext: there is bilateral onychomycosis of the toenails.  Lab Results  Component Value Date   HGBA1C 6.3 (A) 08/11/2020       Assessment & Plan:  Type 2 DM: well-controlled.    Patient Instructions  Please stop taking bromocriptine I have sent a prescription to your pharmacy, to increase the Rybelsus. Please continue the same other medications for diabetes.   check your blood sugar once a day.  vary the time of day when you check, between before the 3 meals, and at bedtime.  also check if you have symptoms of your blood sugar being too high or too low.  please keep a record of the readings and bring it to your next appointment here (or you can bring the meter itself).  You can write it on any piece of paper.  please call us sooner if your blood sugar goes below 70, or if you have a lot of readings over 200.   Please come back for a follow-up appointment in 4 months.

## 2020-08-11 NOTE — Patient Instructions (Addendum)
Please stop taking bromocriptine I have sent a prescription to your pharmacy, to increase the Rybelsus. Please continue the same other medications for diabetes.   check your blood sugar once a day.  vary the time of day when you check, between before the 3 meals, and at bedtime.  also check if you have symptoms of your blood sugar being too high or too low.  please keep a record of the readings and bring it to your next appointment here (or you can bring the meter itself).  You can write it on any piece of paper.  please call us sooner if your blood sugar goes below 70, or if you have a lot of readings over 200.   Please come back for a follow-up appointment in 4 months.

## 2020-09-17 ENCOUNTER — Other Ambulatory Visit: Payer: Self-pay | Admitting: Endocrinology

## 2020-09-17 DIAGNOSIS — E1165 Type 2 diabetes mellitus with hyperglycemia: Secondary | ICD-10-CM

## 2020-09-25 ENCOUNTER — Other Ambulatory Visit: Payer: Self-pay | Admitting: Family Medicine

## 2020-09-25 DIAGNOSIS — F419 Anxiety disorder, unspecified: Secondary | ICD-10-CM

## 2020-09-27 NOTE — Telephone Encounter (Signed)
Received a refill request for the pts. Venlafaxine pt. Last apt was 07/29/20 and next apt is 01/31/21.

## 2020-10-04 ENCOUNTER — Ambulatory Visit
Admission: RE | Admit: 2020-10-04 | Discharge: 2020-10-04 | Disposition: A | Discharge status: Home / Self Care | Payer: Medicare HMO | Source: Ambulatory Visit | Attending: Family Medicine | Admitting: Family Medicine

## 2020-10-04 ENCOUNTER — Other Ambulatory Visit: Payer: Self-pay

## 2020-10-04 DIAGNOSIS — Z1231 Encounter for screening mammogram for malignant neoplasm of breast: Secondary | ICD-10-CM

## 2020-10-06 ENCOUNTER — Other Ambulatory Visit: Payer: Self-pay | Admitting: Family Medicine

## 2020-10-06 DIAGNOSIS — R928 Other abnormal and inconclusive findings on diagnostic imaging of breast: Secondary | ICD-10-CM

## 2020-10-10 ENCOUNTER — Other Ambulatory Visit: Payer: Self-pay | Admitting: Cardiovascular Disease

## 2020-10-10 DIAGNOSIS — I10 Essential (primary) hypertension: Secondary | ICD-10-CM

## 2020-10-27 ENCOUNTER — Ambulatory Visit
Admission: RE | Admit: 2020-10-27 | Discharge: 2020-10-27 | Disposition: A | Discharge status: Home / Self Care | Payer: Medicare HMO | Source: Ambulatory Visit | Attending: Family Medicine | Admitting: Family Medicine

## 2020-10-27 ENCOUNTER — Ambulatory Visit: Payer: Medicare HMO

## 2020-10-27 ENCOUNTER — Other Ambulatory Visit: Payer: Self-pay

## 2020-10-27 DIAGNOSIS — R922 Inconclusive mammogram: Secondary | ICD-10-CM | POA: Diagnosis not present

## 2020-10-27 DIAGNOSIS — N6489 Other specified disorders of breast: Secondary | ICD-10-CM | POA: Diagnosis not present

## 2020-10-27 DIAGNOSIS — R928 Other abnormal and inconclusive findings on diagnostic imaging of breast: Secondary | ICD-10-CM

## 2020-11-17 ENCOUNTER — Encounter: Payer: Self-pay | Admitting: Internal Medicine

## 2020-12-15 ENCOUNTER — Ambulatory Visit (INDEPENDENT_AMBULATORY_CARE_PROVIDER_SITE_OTHER): Payer: Medicare HMO | Admitting: Endocrinology

## 2020-12-15 ENCOUNTER — Other Ambulatory Visit: Payer: Self-pay

## 2020-12-15 VITALS — BP 140/82 | HR 83 | Ht 65.75 in | Wt 202.6 lb

## 2020-12-15 DIAGNOSIS — E119 Type 2 diabetes mellitus without complications: Secondary | ICD-10-CM

## 2020-12-15 LAB — POCT GLYCOSYLATED HEMOGLOBIN (HGB A1C): Hemoglobin A1C: 6.5 % — AB (ref 4.0–5.6)

## 2020-12-15 MED ORDER — RYBELSUS 14 MG PO TABS: 7.0000 mg | ORAL_TABLET | Freq: Every day | ORAL | 3 refills | Status: DC

## 2020-12-15 MED ORDER — METFORMIN HCL ER 500 MG PO TB24: 2000.0000 mg | ORAL_TABLET | Freq: Every day | ORAL | 3 refills | Status: DC

## 2020-12-15 NOTE — Patient Instructions (Addendum)
I have sent a prescription to your pharmacy, to change the Rybelsus to 1/2 of a 14 mg pill, per day.   Please continue the same metformin.   check your blood sugar once a day.  vary the time of day when you check, between before the 3 meals, and at bedtime.  also check if you have symptoms of your blood sugar being too high or too low.  please keep a record of the readings and bring it to your next appointment here (or you can bring the meter itself).  You can write it on any piece of paper.  please call us sooner if your blood sugar goes below 70, or if you have a lot of readings over 200.    Please come back for a follow-up appointment in January.

## 2020-12-15 NOTE — Progress Notes (Signed)
Subjective:    Patient ID: [name], female    DOB: [birth year], 75 y.o.   MRN: [id number]  HPI Pt returns for f/u of diabetes mellitus:  DM type: 2 Dx'ed: 0086 Complications: none.   Therapy: 2 oral meds.   GDM: 1985 DKA: never Severe hypoglycemia: never   Pancreatitis: never Pancreatic imaging: never SDOH: none Other: she has never been on insulin; edema limits rx options; She requests to reduce # of meds.   Interval history: pt states she feels well in general.   Pt says cbg's vary from 125-174.  She takes meds as rx'ed.  She says Rybelsus is too expensive.   Past Medical History:  Diagnosis Date   Abnormal bone density screening    Arthritis    Cancer ([place]) 07/2017   left breast cancer   Complication of anesthesia    "i wake up"   Diverticulosis    DM (diabetes mellitus) ([place])    Family history of colon cancer    Family history of ovarian cancer    Family history of pancreatic cancer    Family history of prostate cancer    Family history of stomach cancer    GERD (gastroesophageal reflux disease)    History of radiation therapy 07/10/17- 08/06/17   40.05 Gy directed to the left breast in 15 fractions, followed by a boost of 10 Gy in 5 fractions.    HTN (hypertension)    Hyperlipidemia    Osteoporosis 06/15/2017   Personal history of radiation therapy    Poor compliance    Vitamin D deficiency     Past Surgical History:  Procedure Laterality Date   APPENDECTOMY     BREAST BIOPSY Left 04/24/2017   malignant   BREAST BIOPSY Left 04/24/2017   benign   BREAST BIOPSY Left 04/25/2017   benign   BREAST BIOPSY Left 04/2018   fibroadenoma w/ calcs   BREAST LUMPECTOMY Left 05/30/2017   BREAST LUMPECTOMY WITH RADIOACTIVE SEED LOCALIZATION Left 05/30/2017   Procedure: LEFT BREAST LUMPECTOMY WITH RADIOACTIVE SEED LOCALIZATION;  Surgeon: Erroll Luna, MD;  Location: [facility];  Service: General;  Laterality: Left;   COLONOSCOPY  06/12/2005   Schooler/normal exam="i  woke up during it"   DILATION AND CURETTAGE OF UTERUS     FOREIGN BODY REMOVAL ESOPHAGEAL     TONSILLECTOMY     TUBAL LIGATION      Social History   Socioeconomic History   Marital status: Married    Spouse name: Not on file   Number of children: Not on file   Years of education: Not on file   Highest education level: Not on file  Occupational History   Not on file  Tobacco Use   Smoking status: Never   Smokeless tobacco: Never  Vaping Use   Vaping Use: Never used  Substance and Sexual Activity   Alcohol use: No   Drug use: No   Sexual activity: Not Currently  Other Topics Concern   Not on file  Social History Narrative   Not on file   Social Determinants of Health   Financial Resource Strain: Not on file  Food Insecurity: Not on file  Transportation Needs: Not on file  Physical Activity: Not on file  Stress: Not on file  Social Connections: Not on file  Intimate Partner Violence: Not on file    Current Outpatient Medications on File Prior to Visit  Medication Sig Dispense Refill   Accu-Chek Softclix Lancets lancets 1 each by  Other route 2 (two) times daily. Used to check blood sugars twice daily. DX code E11.9.     amLODipine (NORVASC) 10 MG tablet TAKE 1 TABLET BY MOUTH EVERY DAY 90 tablet 3   aspirin EC 81 MG tablet Take 81 mg by mouth daily.     Blood Glucose Monitoring Suppl (ACCU-CHEK AVIVA PLUS) w/Device KIT USE AS DIRECTED 2 TIMES DAILY 1 kit 0   calcium-vitamin D (OSCAL WITH D) 250-125 MG-UNIT tablet Take 1 tablet by mouth daily.     carvedilol (COREG) 25 MG tablet TAKE 1 TABLET BY MOUTH TWICE A DAY 180 tablet 3   denosumab (PROLIA) 60 MG/ML SOSY injection TO BE ADMINISTERED AT THE OFFICE EVERY 6 MONTHS 1 mL 0   glucose blood test strip Used to check blood sugars twice daily. DX code E11.9. 100 each 12   losartan-hydrochlorothiazide (HYZAAR) 100-12.5 MG tablet TAKE 1 TABLET BY MOUTH EVERY DAY 90 tablet 0   pravastatin (PRAVACHOL) 20 MG tablet TAKE 1  TABLET BY MOUTH EVERY DAY 30 tablet 5   PROLIA 60 MG/ML SOLN injection   0   repaglinide (PRANDIN) 2 MG tablet TAKE 1 TABLET (2 MG TOTAL) BY MOUTH 2 (TWO) TIMES DAILY BEFORE A MEAL. 180 tablet 3   tamoxifen (NOLVADEX) 20 MG tablet Take 1 tablet (20 mg total) by mouth daily. 90 tablet 3   venlafaxine XR (EFFEXOR-XR) 37.5 MG 24 hr capsule TAKE 1 CAPSULE BY MOUTH DAILY WITH BREAKFAST. 90 capsule 1   vitamin C (ASCORBIC ACID) 500 MG tablet Take 500 mg by mouth daily.     No current facility-administered medications on file prior to visit.    Allergies  Allergen Reactions   Atorvastatin Other (See Comments)    dizziness   Fosamax [Alendronate Sodium]     Cough,trouble shallowing, chest pain   Lisinopril Cough   Tetracycline Nausea And Vomiting    Family History  Problem Relation Age of Onset   Colon cancer Mother 64   Ovarian cancer Mother 39       'radium treatment'   Cardiomyopathy Mother    Colon polyps Mother    Prostate cancer Maternal Uncle 81       metastatic 'died from it'   Stomach cancer Maternal Grandmother 94   Heart disease Maternal Grandmother    Diabetes Maternal Grandfather    Pancreatic cancer Maternal Aunt 66   Prostate cancer Maternal Uncle 75       metastatic/ 'died from it'   Cervical cancer Daughter    Thyroid cancer Paternal Uncle    Thyroid cancer Cousin    Esophageal cancer Neg Hx    Rectal cancer Neg Hx     BP 140/82 (BP Location: Right Arm, Patient Position: Sitting, Cuff Size: Large)   Pulse 83   Ht 5' 5.75" (1.67 m)   Wt 202 lb 9.6 oz (91.9 kg)   SpO2 95%   BMI 32.95 kg/m    Review of Systems Denies n/v/hb    Objective:   Physical Exam VITAL SIGNS:  See vs page GENERAL: no distress Pulses: dorsalis pedis intact bilat.   MSK: no deformity of the feet CV: trace bilat leg edema Skin:  no ulcer on the feet.  normal color and temp on the feet. Neuro: sensation is intact to touch on the feet.   Ext: there is bilateral onychomycosis of  the toenails.     A1c=6.5%    Assessment & Plan:  Type 2 DM: well-controlled  Patient Instructions  I have sent a prescription to your pharmacy, to change the Rybelsus to 1/2 of a 14 mg pill, per day.   Please continue the same metformin.   check your blood sugar once a day.  vary the time of day when you check, between before the 3 meals, and at bedtime.  also check if you have symptoms of your blood sugar being too high or too low.  please keep a record of the readings and bring it to your next appointment here (or you can bring the meter itself).  You can write it on any piece of paper.  please call us sooner if your blood sugar goes below 70, or if you have a lot of readings over 200.    Please come back for a follow-up appointment in January.

## 2021-01-15 ENCOUNTER — Other Ambulatory Visit: Payer: Self-pay | Admitting: Endocrinology

## 2021-01-15 DIAGNOSIS — I10 Essential (primary) hypertension: Secondary | ICD-10-CM

## 2021-01-18 ENCOUNTER — Encounter: Payer: Self-pay | Admitting: Gastroenterology

## 2021-01-19 ENCOUNTER — Encounter: Payer: Medicare HMO | Admitting: Family Medicine

## 2021-01-23 ENCOUNTER — Other Ambulatory Visit: Payer: Self-pay | Admitting: Endocrinology

## 2021-01-27 ENCOUNTER — Telehealth: Payer: Self-pay | Admitting: Family Medicine

## 2021-01-27 NOTE — Telephone Encounter (Signed)
Left message for pt to call. I need to speak to here concerning her next Prolia injection.

## 2021-01-31 ENCOUNTER — Encounter: Payer: Medicare HMO | Admitting: Family Medicine

## 2021-02-02 ENCOUNTER — Other Ambulatory Visit: Payer: Self-pay | Admitting: Endocrinology

## 2021-02-04 NOTE — Telephone Encounter (Signed)
Message sent thru MyChart to F/U with PCP for refills on Pravastatin

## 2021-02-16 ENCOUNTER — Other Ambulatory Visit: Payer: Self-pay | Admitting: Hematology and Oncology

## 2021-02-17 ENCOUNTER — Other Ambulatory Visit: Payer: Self-pay

## 2021-02-17 ENCOUNTER — Other Ambulatory Visit: Payer: Self-pay | Admitting: Family Medicine

## 2021-02-17 ENCOUNTER — Other Ambulatory Visit: Payer: Medicare HMO

## 2021-02-17 ENCOUNTER — Other Ambulatory Visit (HOSPITAL_COMMUNITY): Payer: Self-pay

## 2021-02-17 DIAGNOSIS — Z23 Encounter for immunization: Secondary | ICD-10-CM | POA: Diagnosis not present

## 2021-02-17 DIAGNOSIS — M810 Age-related osteoporosis without current pathological fracture: Secondary | ICD-10-CM | POA: Diagnosis not present

## 2021-02-17 MED ORDER — DENOSUMAB 60 MG/ML ~~LOC~~ SOSY
60.0000 mg | PREFILLED_SYRINGE | Freq: Once | SUBCUTANEOUS | Status: AC
  Administered 2021-02-17: 60 mg via SUBCUTANEOUS

## 2021-02-18 ENCOUNTER — Other Ambulatory Visit (HOSPITAL_COMMUNITY): Payer: Self-pay

## 2021-02-21 ENCOUNTER — Other Ambulatory Visit: Payer: Self-pay | Admitting: Endocrinology

## 2021-02-21 ENCOUNTER — Other Ambulatory Visit (HOSPITAL_COMMUNITY): Payer: Self-pay

## 2021-03-18 ENCOUNTER — Other Ambulatory Visit: Payer: Self-pay | Admitting: Endocrinology

## 2021-03-23 ENCOUNTER — Telehealth: Payer: Self-pay

## 2021-03-23 DIAGNOSIS — F419 Anxiety disorder, unspecified: Secondary | ICD-10-CM

## 2021-03-23 MED ORDER — VENLAFAXINE HCL ER 37.5 MG PO CP24
37.5000 mg | ORAL_CAPSULE | Freq: Every day | ORAL | 1 refills | Status: DC
Start: 2021-03-23 — End: 2021-08-29

## 2021-03-23 NOTE — Telephone Encounter (Signed)
[facility] the pts. Venlafaxine pt. Last apt was 07/29/20.

## 2021-04-04 ENCOUNTER — Other Ambulatory Visit: Payer: Self-pay | Admitting: Endocrinology

## 2021-04-12 ENCOUNTER — Other Ambulatory Visit: Payer: Self-pay

## 2021-04-12 DIAGNOSIS — I10 Essential (primary) hypertension: Secondary | ICD-10-CM

## 2021-04-12 MED ORDER — LOSARTAN POTASSIUM-HCTZ 100-12.5 MG PO TABS: 1.0000 | ORAL_TABLET | Freq: Every day | ORAL | 0 refills | Status: DC

## 2021-04-19 ENCOUNTER — Ambulatory Visit: Payer: Medicare HMO | Admitting: Endocrinology

## 2021-04-23 ENCOUNTER — Emergency Department (HOSPITAL_BASED_OUTPATIENT_CLINIC_OR_DEPARTMENT_OTHER): Payer: Medicare HMO

## 2021-04-23 ENCOUNTER — Emergency Department (HOSPITAL_BASED_OUTPATIENT_CLINIC_OR_DEPARTMENT_OTHER)
Admission: EM | Admit: 2021-04-23 | Discharge: 2021-04-23 | Disposition: A | Discharge status: Home / Self Care | Payer: Medicare HMO | Attending: Emergency Medicine | Admitting: Emergency Medicine

## 2021-04-23 ENCOUNTER — Other Ambulatory Visit: Payer: Self-pay

## 2021-04-23 ENCOUNTER — Encounter (HOSPITAL_BASED_OUTPATIENT_CLINIC_OR_DEPARTMENT_OTHER): Payer: Self-pay

## 2021-04-23 DIAGNOSIS — Z7984 Long term (current) use of oral hypoglycemic drugs: Secondary | ICD-10-CM | POA: Diagnosis not present

## 2021-04-23 DIAGNOSIS — I10 Essential (primary) hypertension: Secondary | ICD-10-CM | POA: Diagnosis not present

## 2021-04-23 DIAGNOSIS — E119 Type 2 diabetes mellitus without complications: Secondary | ICD-10-CM | POA: Diagnosis not present

## 2021-04-23 DIAGNOSIS — U071 COVID-19: Secondary | ICD-10-CM | POA: Diagnosis not present

## 2021-04-23 DIAGNOSIS — R059 Cough, unspecified: Secondary | ICD-10-CM | POA: Diagnosis present

## 2021-04-23 DIAGNOSIS — R0602 Shortness of breath: Secondary | ICD-10-CM | POA: Diagnosis not present

## 2021-04-23 DIAGNOSIS — Z79899 Other long term (current) drug therapy: Secondary | ICD-10-CM | POA: Diagnosis not present

## 2021-04-23 DIAGNOSIS — K449 Diaphragmatic hernia without obstruction or gangrene: Secondary | ICD-10-CM | POA: Diagnosis not present

## 2021-04-23 DIAGNOSIS — I517 Cardiomegaly: Secondary | ICD-10-CM | POA: Diagnosis not present

## 2021-04-23 LAB — BASIC METABOLIC PANEL
Anion gap: 12 (ref 5–15)
BUN: 18 mg/dL (ref 8–23)
CO2: 23 mmol/L (ref 22–32)
Calcium: 9.1 mg/dL (ref 8.9–10.3)
Chloride: 99 mmol/L (ref 98–111)
Creatinine, Ser: 0.89 mg/dL (ref 0.44–1.00)
GFR, Estimated: >60 mL/min (ref >60)
Glucose, Bld: 245 mg/dL — ABNORMAL HIGH (ref 70–99)
Potassium: 4.3 mmol/L (ref 3.5–5.1)
Sodium: 134 mmol/L — ABNORMAL LOW (ref 135–145)

## 2021-04-23 LAB — CBC WITH DIFFERENTIAL/PLATELET
Abs Immature Granulocytes: 0.05 10*3/uL (ref 0.00–0.07)
Basophils Absolute: 0.1 10*3/uL (ref 0.0–0.1)
Basophils Relative: 1 %
Eosinophils Absolute: 0.1 10*3/uL (ref 0.0–0.5)
Eosinophils Relative: 1 %
HCT: 38 % (ref 36.0–46.0)
Hemoglobin: 12.5 g/dL (ref 12.0–15.0)
Immature Granulocytes: 1 %
Lymphocytes Relative: 7 %
Lymphs Abs: 0.7 10*3/uL (ref 0.7–4.0)
MCH: 28.7 pg (ref 26.0–34.0)
MCHC: 32.9 g/dL (ref 30.0–36.0)
MCV: 87.4 fL (ref 80.0–100.0)
Monocytes Absolute: 1.1 10*3/uL — ABNORMAL HIGH (ref 0.1–1.0)
Monocytes Relative: 11 %
Neutro Abs: 7.9 10*3/uL — ABNORMAL HIGH (ref 1.7–7.7)
Neutrophils Relative %: 79 %
Platelets: 201 10*3/uL (ref 150–400)
RBC: 4.35 MIL/uL (ref 3.87–5.11)
RDW: 13.6 % (ref 11.5–15.5)
WBC: 9.9 10*3/uL (ref 4.0–10.5)
nRBC: 0 % (ref 0.0–0.2)

## 2021-04-23 LAB — RESP PANEL BY RT-PCR (FLU A&B, COVID) ARPGX2
Influenza A by PCR: NEGATIVE
Influenza B by PCR: NEGATIVE
SARS Coronavirus 2 by RT PCR: POSITIVE — AB

## 2021-04-23 MED ORDER — ACETAMINOPHEN 500 MG PO TABS: 1000.0000 mg | ORAL_TABLET | Freq: Once | ORAL | Status: DC

## 2021-04-23 MED ORDER — NIRMATRELVIR/RITONAVIR (PAXLOVID)TABLET: 3.0000 | ORAL_TABLET | Freq: Two times a day (BID) | ORAL | 0 refills | Status: AC

## 2021-04-23 NOTE — ED Provider Notes (Signed)
[name] Provider Note   CSN: [id number] Arrival date & time: 04/23/21  1138     History  Chief Complaint  Patient presents with   Cough    [name] is a 76 y.o. female.  Patient is a 76 year old female with a history of GERD, hyperlipidemia, hypertension and diabetes who is presenting today with complaints of URI symptoms.  Patient started having symptoms on Wednesday approximately 4 days prior to arrival.  She initially reported she had a mild runny nose and a cough but yesterday symptoms started worsening where she started having body aches, shortness of breath, intermittent nausea and just feeling general malaise.  She reports she feels short of breath all the time and the cough is severe but nonproductive.  Multiple members of her family are currently positive for COVID and she thinks she has it too.  She has no history of lung disease and is not a smoker.  The history is provided by the patient and medical records.  Cough Cough characteristics:  Non-productive and harsh     Home Medications Prior to Admission medications   Medication Sig Start Date End Date Taking? Authorizing Provider  nirmatrelvir/ritonavir EUA (PAXLOVID) 20 x 150 MG & 10 x 100MG TABS Take 3 tablets by mouth 2 (two) times daily for 5 days. Patient GFR is 60. Take nirmatrelvir (150 mg) two tablets twice daily for 5 days and ritonavir (100 mg) one tablet twice daily for 5 days. 04/23/21 04/28/21 Yes Lyndzie Zentz, Loree Fee, MD  Accu-Chek Softclix Lancets lancets 1 each by Other route 2 (two) times daily. Used to check blood sugars twice daily. DX code E11.9.    [provider]  amLODipine (NORVASC) 10 MG tablet TAKE 1 TABLET BY MOUTH EVERY DAY 06/14/20   Lorretta Harp, MD  aspirin EC 81 MG tablet Take 81 mg by mouth daily.    [provider]  Blood Glucose Monitoring Suppl (ACCU-CHEK AVIVA PLUS) w/Device KIT USE AS DIRECTED 2 TIMES DAILY 03/20/17   Henson,  [name] L, PA-C  calcium-vitamin D (OSCAL WITH D) 250-125 MG-UNIT tablet Take 1 tablet by mouth daily.    [provider]  carvedilol (COREG) 25 MG tablet TAKE 1 TABLET BY MOUTH TWICE A DAY 06/14/20   Lorretta Harp, MD  glucose blood test strip Used to check blood sugars twice daily. DX code E11.9. 08/10/17   Renato Shin, MD  losartan-hydrochlorothiazide [facility]) 100-12.5 MG tablet Take 1 tablet by mouth daily. 04/12/21   Tysinger, Camelia Eng, PA-C  metFORMIN (GLUCOPHAGE-XR) 500 MG 24 hr tablet Take 4 tablets (2,000 mg total) by mouth daily with breakfast. 12/15/20   Renato Shin, MD  pravastatin (PRAVACHOL) 20 MG tablet TAKE 1 TABLET BY MOUTH EVERY DAY 08/09/20   Henson, [name] L, PA-C  PROLIA 60 MG/ML SOLN injection  07/04/17   [provider]  repaglinide (PRANDIN) 2 MG tablet TAKE 1 TABLET (2 MG TOTAL) BY MOUTH 2 (TWO) TIMES DAILY BEFORE A MEAL. 01/24/21   Renato Shin, MD  Semaglutide (RYBELSUS) 14 MG TABS Take 7 mg by mouth daily. 12/15/20   Renato Shin, MD  tamoxifen (NOLVADEX) 20 MG tablet TAKE 1 TABLET BY MOUTH EVERY DAY 02/16/21   Nicholas Lose, MD  venlafaxine XR (EFFEXOR-XR) 37.5 MG 24 hr capsule Take 1 capsule (37.5 mg total) by mouth daily with breakfast. 03/23/21   Denita Lung, MD  vitamin C (ASCORBIC ACID) 500 MG tablet Take 500 mg by mouth daily.  [provider]      Allergies    Atorvastatin, Fosamax [alendronate sodium], Lisinopril, and Tetracycline    Review of Systems   Review of Systems  Respiratory:  Positive for cough.    Physical Exam Updated Vital Signs BP (!) 133/44 (BP Location: Left Wrist)    Pulse 85    Temp 99.5 F (37.5 C)    Resp 16    Ht '5\' 5"'  (1.651 m)    Wt 90.7 kg    SpO2 96%    BMI 33.28 kg/m  Physical Exam Vitals and nursing note reviewed.  Constitutional:      General: She is not in acute distress.    Appearance: Normal appearance. She is well-developed.  HENT:     Head: Normocephalic and atraumatic.     Right Ear:  Tympanic membrane normal.     Left Ear: Tympanic membrane normal.     Nose: Congestion and rhinorrhea present.     Mouth/Throat:     Mouth: Mucous membranes are moist.     Pharynx: Posterior oropharyngeal erythema present. No oropharyngeal exudate.  Eyes:     Pupils: Pupils are equal, round, and reactive to light.  Cardiovascular:     Rate and Rhythm: Regular rhythm. Tachycardia present.     Heart sounds: Normal heart sounds. No murmur heard.   No friction rub.  Pulmonary:     Effort: Pulmonary effort is normal.     Breath sounds: Normal breath sounds. No wheezing or rales.  Abdominal:     General: Bowel sounds are normal. There is no distension.     Palpations: Abdomen is soft.     Tenderness: There is no abdominal tenderness. There is no guarding or rebound.  Musculoskeletal:        General: No tenderness. Normal range of motion.     Cervical back: Normal range of motion and neck supple.     Comments: No edema  Skin:    General: Skin is warm and dry.     Findings: No rash.  Neurological:     Mental Status: She is alert and oriented to person, place, and time. Mental status is at baseline.     Cranial Nerves: No cranial nerve deficit.  Psychiatric:        Mood and Affect: Mood normal.        Behavior: Behavior normal.    ED Results / Procedures / Treatments   Labs (all labs ordered are listed, but only abnormal results are displayed) Labs Reviewed  RESP PANEL BY RT-PCR (FLU A&B, COVID) ARPGX2 - Abnormal; Notable for the following components:      Result Value   SARS Coronavirus 2 by RT PCR POSITIVE (*)    All other components within normal limits  CBC WITH DIFFERENTIAL/PLATELET - Abnormal; Notable for the following components:   Neutro Abs 7.9 (*)    Monocytes Absolute 1.1 (*)    All other components within normal limits  BASIC METABOLIC PANEL - Abnormal; Notable for the following components:   Sodium 134 (*)    Glucose, Bld 245 (*)    All other components within  normal limits    EKG None  Radiology DG Chest Port 1 View  Result Date: 04/23/2021 CLINICAL DATA:  Shortness of breath EXAM: PORTABLE CHEST 1 VIEW COMPARISON:  07/28/2019 FINDINGS: Heart is enlarged in size. Are no signs of pulmonary edema or focal pulmonary consolidation. There is moderate size fixed hiatal hernia. There is no pleural effusion or  pneumothorax. IMPRESSION: Cardiomegaly. There are no signs of pulmonary edema or focal pulmonary consolidation. Moderate sized fixed hiatal hernia. Electronically Signed   By: Elmer Picker M.D.   On: 04/23/2021 13:47    Procedures Procedures    Medications Ordered in ED Medications  acetaminophen (TYLENOL) tablet 1,000 mg (has no administration in time range)    ED Course/ Medical Decision Making/ A&P                           Medical Decision Making  Pt with symptoms consistent with viral URI/covid.  Well appearing here.  No signs of breathing difficulty-patient does report feeling short of breath.  Sats are 97% on room air.  No tachypnea.  No signs of pharyngitis, otitis or abnormal abdominal findings.   Reviewed patient's external records to confirm medical problems.  Patient's COVID test is positive today.  Given her multiple medical problems she would be a candidate for paxlovid.  We will check labs to ensure normal renal function.  Patient has not received any COVID vaccines and reports she is not interested.  2:56 PM I independently reviewed and evaluated patient's labs and x-ray.  Patient's chest x-ray shows no acute findings today.  CBC is within normal limits and BMP with normal renal function.  Patient's COVID test is positive.  Discussed with pharmacy and patient is a candidate for paxlovid.  She will need to cut her amlodipine in half but she no longer takes bromocriptine.  Findings were discussed with the patient and she was given return precautions.  She does not appear to have any social barriers to discharge  today.         Final Clinical Impression(s) / ED Diagnoses Final diagnoses:  COVID    Rx / DC Orders ED Discharge Orders          Ordered    nirmatrelvir/ritonavir EUA (PAXLOVID) 20 x 150 MG & 10 x 100MG TABS  2 times daily        04/23/21 1455              Blanchie Dessert, MD 04/23/21 1458

## 2021-04-23 NOTE — ED Triage Notes (Signed)
Pt presents with cough, ShOB, fever, generalized weakness, nausea after being exposed to [place]. Symptoms x 2 days.

## 2021-04-23 NOTE — Discharge Instructions (Addendum)
Cut the amlodipine in 1/2 while you are taking the paxlovid.  If you develop nausea and vomiting from the medication discontinue it.  If you start having worsening shortness of breath, vomiting or inability to hold anything down or other concerns return to the emergency room.

## 2021-04-23 NOTE — ED Notes (Signed)
RT Note: Blood drawn for labs via straight stick, 23 G Butterfly-R A/C no complications, labelled/given to Lab/RN aware.

## 2021-04-23 NOTE — ED Notes (Signed)
Dc instructions reviewed with patient. Patient voiced understanding. Dc with belongings.  °

## 2021-04-26 ENCOUNTER — Encounter: Payer: Self-pay | Admitting: Physician Assistant

## 2021-04-26 ENCOUNTER — Telehealth: Payer: Self-pay | Admitting: Endocrinology

## 2021-04-26 ENCOUNTER — Other Ambulatory Visit: Payer: Self-pay

## 2021-04-26 DIAGNOSIS — E119 Type 2 diabetes mellitus without complications: Secondary | ICD-10-CM

## 2021-04-26 MED ORDER — RYBELSUS 14 MG PO TABS: 7.0000 mg | ORAL_TABLET | Freq: Every day | ORAL | 3 refills | Status: DC

## 2021-04-26 NOTE — Telephone Encounter (Signed)
Rx sent 

## 2021-04-26 NOTE — Telephone Encounter (Signed)
Patient called to request new RX request for Rybelsus 7 mg - 30 tablets be sent to:  [id number] [place] [place]. Phone:  [phone]  Fax:  [phone]

## 2021-05-03 ENCOUNTER — Other Ambulatory Visit: Payer: Self-pay

## 2021-05-03 ENCOUNTER — Telehealth: Payer: Self-pay | Admitting: Family Medicine

## 2021-05-03 MED ORDER — PRAVASTATIN SODIUM 20 MG PO TABS: 20.0000 mg | ORAL_TABLET | Freq: Every day | ORAL | 0 refills | Status: DC

## 2021-05-03 NOTE — Telephone Encounter (Signed)
Pt called and is requesting a refill on her pravastain [id number] [place], [place].

## 2021-05-18 ENCOUNTER — Telehealth: Payer: Self-pay | Admitting: Family Medicine

## 2021-05-18 NOTE — Telephone Encounter (Signed)
Left message for patient to call back and schedule Medicare Annual Wellness Visit (AWV) either virtually or in office. I left my number for patient to call [phone].  Last AWV ;07/29/20  please schedule at anytime with health coach  This should be a 45 minute visit.   Can schedule AWV calendar year with  [place]

## 2021-05-27 ENCOUNTER — Encounter: Payer: Medicare HMO | Admitting: Physician Assistant

## 2021-06-01 NOTE — Progress Notes (Signed)
Patient Care Team: [name] as PCP - General (Physician Assistant) Lorretta Harp, MD as PCP - Cardiology (Cardiology) Nicholas Lose, MD as Consulting Physician (Hematology and Oncology) Eppie Gibson, MD as Attending Physician (Radiation Oncology) Erroll Luna, MD as Consulting Physician (General Surgery) Delice Bison Charlestine Massed, NP as Nurse Practitioner (Hematology and Oncology)  DIAGNOSIS:    ICD-10-CM   1. Ductal carcinoma in situ (DCIS) of left breast  D05.12       SUMMARY OF ONCOLOGIC HISTORY: Oncology History  Ductal carcinoma in situ (DCIS) of left breast  04/24/2017 Initial Diagnosis   Screening detected left breast calcifications in 3 areas UOQ, anterior group of calcifications 1.4 cm biopsy revealed high-grade DCIS with calcifications ER 95%, PR 95%, other 2 areas benign fibrocystic changes, Tis N0 stage 0   05/30/2017 Surgery   Left Lumpectomy: DCIS Intermediate Grade with necrosis and calcs, 0.9 cm, Er: 95%, PR 95%, TisNx (Stage 0)   07/06/2017 Genetic Testing   The Common Hereditary Cancer Panel + Thyroid cancer Panel was ordered.  The following genes were evaluated for sequence changes and exonic deletions/duplications: APC, ATM, AXIN2, BARD1, BMPR1A, BRCA1, BRCA2, BRIP1, CDH1, CDK4, CDKN2A (p14ARF), CDKN2A (p16INK4a), CHEK2, CTNNA1, DICER1, EPCAM*, GREM1*, KIT, MEN1, MLH1, MSH2, MSH3, MSH6, MUTYH, NBN, NF1, PALB2, PDGFRA, PMS2, POLD1, POLE, PRKAR1A, PTEN, RAD50, RAD51C, RAD51D, RET, SDHB, SDHC, SDHD, SMAD4, SMARCA4, STK11, TP53, TSC1, TSC2, VHL.  The following genes were evaluated for sequence changes only: HOXB13*, NTHL1*, SDHA  Results: Negative, no pathogenic variants identified.  The date of this test report is 07/06/2017.   07/10/2017 - 08/06/2017 Radiation Therapy   Adjuvant radiation therapy   08/07/2017 -  Anti-estrogen oral therapy   Adjuvant antiestrogen therapy with tamoxifen 20 mg daily x5 years     CHIEF COMPLIANT: Follow-up of left  breast DCIS on tamoxifen  INTERVAL HISTORY: [name] is a 76 y.o. with above-mentioned history of breast DCIS who underwent lumpectomy, radiation, and is currently on antiestrogen therapy with tamoxifen. Mammogram on 10/27/2020 showed no evidence of malignancy bilaterally. She presents to the clinic today for annual follow-up.  She denies any adverse effects to tamoxifen therapy.  ALLERGIES:  is allergic to atorvastatin, fosamax [alendronate sodium], lisinopril, and tetracycline.  MEDICATIONS:  Current Outpatient Medications  Medication Sig Dispense Refill   Accu-Chek Softclix Lancets lancets 1 each by Other route 2 (two) times daily. Used to check blood sugars twice daily. DX code E11.9.     amLODipine (NORVASC) 10 MG tablet TAKE 1 TABLET BY MOUTH EVERY DAY 90 tablet 3   aspirin EC 81 MG tablet Take 81 mg by mouth daily.     Blood Glucose Monitoring Suppl (ACCU-CHEK AVIVA PLUS) w/Device KIT USE AS DIRECTED 2 TIMES DAILY 1 kit 0   calcium-vitamin D (OSCAL WITH D) 250-125 MG-UNIT tablet Take 1 tablet by mouth daily.     carvedilol (COREG) 25 MG tablet TAKE 1 TABLET BY MOUTH TWICE A DAY 180 tablet 3   glucose blood test strip Used to check blood sugars twice daily. DX code E11.9. 100 each 12   losartan-hydrochlorothiazide (HYZAAR) 100-12.5 MG tablet Take 1 tablet by mouth daily. 90 tablet 0   metFORMIN (GLUCOPHAGE-XR) 500 MG 24 hr tablet Take 4 tablets (2,000 mg total) by mouth daily with breakfast. 360 tablet 3   pravastatin (PRAVACHOL) 20 MG tablet Take 1 tablet (20 mg total) by mouth daily. 30 tablet 0   PROLIA 60 MG/ML SOLN injection   0  repaglinide (PRANDIN) 2 MG tablet TAKE 1 TABLET (2 MG TOTAL) BY MOUTH 2 (TWO) TIMES DAILY BEFORE A MEAL. 180 tablet 3   Semaglutide (RYBELSUS) 14 MG TABS Take 7 mg by mouth daily. 30 tablet 3   venlafaxine XR (EFFEXOR-XR) 37.5 MG 24 hr capsule Take 1 capsule (37.5 mg total) by mouth daily with breakfast. 90 capsule 1   vitamin C (ASCORBIC ACID) 500  MG tablet Take 500 mg by mouth daily.     tamoxifen (NOLVADEX) 20 MG tablet Take 1 tablet (20 mg total) by mouth daily. 90 tablet 3   No current facility-administered medications for this visit.    PHYSICAL EXAMINATION: ECOG PERFORMANCE STATUS: 1 - Symptomatic but completely ambulatory  Vitals:   06/02/21 1530  BP: (!) 172/70  Pulse: 79  Resp: 18  Temp: (!) 97.5 F (36.4 C)  SpO2: 98%   Filed Weights   06/02/21 1530  Weight: 196 lb 1.6 oz (89 kg)    BREAST: No palpable masses or nodules in either right or left breasts. No palpable axillary supraclavicular or infraclavicular adenopathy no breast tenderness or nipple discharge. (exam performed in the presence of a chaperone)  LABORATORY DATA:  I have reviewed the data as listed CMP Latest Ref Rng & Units 04/23/2021 07/29/2020 05/13/2020  Glucose 70 - 99 mg/dL 245(H) 146(H) 188(H)  BUN 8 - 23 mg/dL '18 20 19  ' Creatinine 0.44 - 1.00 mg/dL 0.89 0.90 0.86  Sodium 135 - 145 mmol/L 134(L) 140 138  Potassium 3.5 - 5.1 mmol/L 4.3 4.9 4.4  Chloride 98 - 111 mmol/L 99 99 103  CO2 22 - 32 mmol/L '23 20 27  ' Calcium 8.9 - 10.3 mg/dL 9.1 9.5 9.4  Total Protein 6.0 - 8.5 g/dL - 6.5 -  Total Bilirubin 0.0 - 1.2 mg/dL - 0.3 -  Alkaline Phos 44 - 121 IU/L - 47 -  AST 0 - 40 IU/L - 28 -  ALT 0 - 32 IU/L - 20 -    Lab Results  Component Value Date   WBC 9.9 04/23/2021   HGB 12.5 04/23/2021   HCT 38.0 04/23/2021   MCV 87.4 04/23/2021   PLT 201 04/23/2021   NEUTROABS 7.9 (H) 04/23/2021    ASSESSMENT & PLAN:  Ductal carcinoma in situ (DCIS) of left breast 05/30/17: Left Lumpectomy: DCIS Intermediate Grade with necrosis and calcs, 0.9 cm, Er: 95%, PR 95%, TisNx (Stage 0) Adj RT: 07/10/2017-08/06/2017   Plan: Adj Tamoxifen 20 mg daily X 5 years started 08/07/2017 Tamoxifen toxicities: No side effects from tamoxifen   Breast cancer surveillance: 1.  Breast exam 06/02/21 Benign 2.  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   Return to clinic in 1 year for follow-up    No orders of the defined types were placed in this encounter.  The patient has a good understanding of the overall plan. she agrees with it. she will call with any problems that may develop before the next visit here.  Total time spent: 20 mins including face to face time and time spent for planning, charting and coordination of care  Rulon Eisenmenger, MD, MPH 06/02/2021  I, Thana Ates, am acting as scribe for Dr. Nicholas Lose.  I have reviewed the above documentation for accuracy and completeness, and I agree with the above.

## 2021-06-01 NOTE — Assessment & Plan Note (Signed)
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  Return to clinic in 1 year for follow-up

## 2021-06-02 ENCOUNTER — Inpatient Hospital Stay: Payer: Medicare HMO | Attending: Hematology and Oncology | Admitting: Hematology and Oncology

## 2021-06-02 ENCOUNTER — Other Ambulatory Visit: Payer: Self-pay

## 2021-06-02 DIAGNOSIS — D0512 Intraductal carcinoma in situ of left breast: Secondary | ICD-10-CM | POA: Diagnosis not present

## 2021-06-02 DIAGNOSIS — Z79810 Long term (current) use of selective estrogen receptor modulators (SERMs): Secondary | ICD-10-CM | POA: Diagnosis not present

## 2021-06-02 DIAGNOSIS — Z923 Personal history of irradiation: Secondary | ICD-10-CM | POA: Diagnosis not present

## 2021-06-02 MED ORDER — TAMOXIFEN CITRATE 20 MG PO TABS: 20.0000 mg | ORAL_TABLET | Freq: Every day | ORAL | 3 refills | Status: DC

## 2021-06-09 NOTE — Progress Notes (Addendum)
Acute Office Visit  Subjective:    Patient ID: [name], female    DOB: 1945-07-03, 76 y.o.   MRN: [id number]  Chief Complaint  Patient presents with   Medication Refill    Med refill, knots on right thigh. Not painful    HPI Patient is in today for a follow up appointment; presents with her Husband. States she is tolerating her medicines well; reports her appetite is normal and she drinks about 6 glasses of water a day. States she checks her blood pressure at home and it is about 140/80. Reports having knots on her legs for 5 years that are not painful, but her Husband would like them evaluated.   Cardiology - Quay Burow MD Hematology & Oncology - Nicholas Lose, MD, Gardenia Phlegm, NP Radiation Oncology - Eppie Gibson, MD General Surgery - Erroll Luna, MD Endocrinology - Renato Shin, MD  Past Medical History:  Diagnosis Date   Abnormal bone density screening    Arthritis    Cancer [facility]) 07/2017   left breast cancer   Complication of anesthesia    "i wake up"   Diverticulosis    DM (diabetes mellitus) ([place])    Family history of colon cancer    Family history of ovarian cancer    Family history of pancreatic cancer    Family history of prostate cancer    Family history of stomach cancer    GERD (gastroesophageal reflux disease)    History of radiation therapy 07/10/17- 08/06/17   40.05 Gy directed to the left breast in 15 fractions, followed by a boost of 10 Gy in 5 fractions.    HTN (hypertension)    Hyperlipidemia    Osteoporosis 06/15/2017   Personal history of radiation therapy    Poor compliance    Vitamin D deficiency     Past Surgical History:  Procedure Laterality Date   APPENDECTOMY     BREAST BIOPSY Left 04/24/2017   malignant   BREAST BIOPSY Left 04/24/2017   benign   BREAST BIOPSY Left 04/25/2017   benign   BREAST BIOPSY Left 04/2018   fibroadenoma w/ calcs   BREAST LUMPECTOMY Left 05/30/2017   BREAST LUMPECTOMY WITH  RADIOACTIVE SEED LOCALIZATION Left 05/30/2017   Procedure: LEFT BREAST LUMPECTOMY WITH RADIOACTIVE SEED LOCALIZATION;  Surgeon: Erroll Luna, MD;  Location: [place];  Service: General;  Laterality: Left;   COLONOSCOPY  06/12/2005   [place]er/normal exam="i woke up during it"   DILATION AND CURETTAGE OF UTERUS     FOREIGN BODY REMOVAL ESOPHAGEAL     TONSILLECTOMY     TUBAL LIGATION      Family History  Problem Relation Age of Onset   Colon cancer Mother 36   Ovarian cancer Mother 6       'radium treatment'   Cardiomyopathy Mother    Colon polyps Mother    Prostate cancer Maternal Uncle 39       metastatic 'died from it'   Stomach cancer Maternal Grandmother 94   Heart disease Maternal Grandmother    Diabetes Maternal Grandfather    Pancreatic cancer Maternal Aunt 66   Prostate cancer Maternal Uncle 75       metastatic/ 'died from it'   Cervical cancer Daughter    Thyroid cancer Paternal Uncle    Thyroid cancer Cousin    Esophageal cancer Neg Hx    Rectal cancer Neg Hx     Social History   Socioeconomic History   Marital status:  Married    Spouse name: Not on file   Number of children: Not on file   Years of education: Not on file   Highest education level: Not on file  Occupational History   Not on file  Tobacco Use   Smoking status: Never   Smokeless tobacco: Never  Vaping Use   Vaping Use: Never used  Substance and Sexual Activity   Alcohol use: No   Drug use: No   Sexual activity: Not Currently  Other Topics Concern   Not on file  Social History Narrative   Not on file   Social Determinants of Health   Financial Resource Strain: Not on file  Food Insecurity: Not on file  Transportation Needs: Not on file  Physical Activity: Not on file  Stress: Not on file  Social Connections: Not on file  Intimate Partner Violence: Not on file    Outpatient Medications Prior to Visit  Medication Sig Dispense Refill   Accu-Chek Softclix Lancets lancets 1 each by  Other route 2 (two) times daily. Used to check blood sugars twice daily. DX code E11.9.     amLODipine (NORVASC) 10 MG tablet TAKE 1 TABLET BY MOUTH EVERY DAY 90 tablet 3   aspirin EC 81 MG tablet Take 81 mg by mouth daily.     Blood Glucose Monitoring Suppl (ACCU-CHEK AVIVA PLUS) w/Device KIT USE AS DIRECTED 2 TIMES DAILY 1 kit 0   calcium-vitamin D (OSCAL WITH D) 250-125 MG-UNIT tablet Take 1 tablet by mouth daily.     carvedilol (COREG) 25 MG tablet TAKE 1 TABLET BY MOUTH TWICE A DAY 180 tablet 3   glucose blood test strip Used to check blood sugars twice daily. DX code E11.9. 100 each 12   metFORMIN (GLUCOPHAGE-XR) 500 MG 24 hr tablet Take 4 tablets (2,000 mg total) by mouth daily with breakfast. 360 tablet 3   PROLIA 60 MG/ML SOLN injection   0   repaglinide (PRANDIN) 2 MG tablet TAKE 1 TABLET (2 MG TOTAL) BY MOUTH 2 (TWO) TIMES DAILY BEFORE A MEAL. 180 tablet 3   Semaglutide (RYBELSUS) 14 MG TABS Take 7 mg by mouth daily. 30 tablet 3   tamoxifen (NOLVADEX) 20 MG tablet Take 1 tablet (20 mg total) by mouth daily. 90 tablet 3   vitamin C (ASCORBIC ACID) 500 MG tablet Take 500 mg by mouth daily.     losartan-hydrochlorothiazide (HYZAAR) 100-12.5 MG tablet Take 1 tablet by mouth daily. 90 tablet 0   pravastatin (PRAVACHOL) 20 MG tablet Take 1 tablet (20 mg total) by mouth daily. 30 tablet 0   venlafaxine XR (EFFEXOR-XR) 37.5 MG 24 hr capsule Take 1 capsule (37.5 mg total) by mouth daily with breakfast. (Patient not taking: Reported on 06/10/2021) 90 capsule 1   No facility-administered medications prior to visit.    Allergies  Allergen Reactions   Atorvastatin Other (See Comments)    dizziness   Fosamax [Alendronate Sodium]     Cough,trouble shallowing, chest pain   Lisinopril Cough   Tetracycline Nausea And Vomiting    Review of Systems  Constitutional:  Negative for activity change and chills.  HENT:  Negative for congestion and voice change.   Eyes:  Negative for pain and redness.   Respiratory:  Negative for cough and wheezing.   Cardiovascular:  Negative for chest pain.  Gastrointestinal:  Negative for constipation, diarrhea, nausea and vomiting.  Endocrine: Negative for polyuria.  Genitourinary:  Negative for frequency.  Skin:  Negative for color  change and rash.  Allergic/Immunologic: Negative for immunocompromised state.  Neurological:  Negative for dizziness.  Psychiatric/Behavioral:  Negative for agitation.       Objective:    Physical Exam Vitals and nursing note reviewed.  Constitutional:      General: She is not in acute distress.    Appearance: Normal appearance. She is not ill-appearing.  HENT:     Head: Normocephalic and atraumatic.     Right Ear: External ear normal.     Left Ear: External ear normal.     Nose: No congestion.  Eyes:     Extraocular Movements: Extraocular movements intact.     Conjunctiva/sclera: Conjunctivae normal.     Pupils: Pupils are equal, round, and reactive to light.  Cardiovascular:     Rate and Rhythm: Normal rate and regular rhythm.     Pulses: Normal pulses.     Heart sounds: Normal heart sounds.  Pulmonary:     Effort: Pulmonary effort is normal.     Breath sounds: Normal breath sounds. No wheezing.  Abdominal:     General: Bowel sounds are normal.     Palpations: Abdomen is soft.  Musculoskeletal:     Cervical back: Normal range of motion and neck supple.     Right lower leg: No edema.     Left lower leg: No edema.  Skin:    General: Skin is warm and dry.     Findings: No bruising.  Neurological:     General: No focal deficit present.     Mental Status: She is alert and oriented to person, place, and time.  Psychiatric:        Mood and Affect: Mood normal.        Behavior: Behavior normal.        Thought Content: Thought content normal.    BP 140/90    Pulse 74    Ht '5\' 4"'  (1.626 m)    Wt 195 lb (88.5 kg)    SpO2 98%    BMI 33.47 kg/m   Wt Readings from Last 3 Encounters:  06/10/21 195 lb  (88.5 kg)  06/02/21 196 lb 1.6 oz (89 kg)  04/23/21 200 lb (90.7 kg)    There are no preventive care reminders to display for this patient.   There are no preventive care reminders to display for this patient.   Lab Results  Component Value Date   TSH 2.300 07/29/2020   Lab Results  Component Value Date   WBC 9.9 04/23/2021   HGB 12.5 04/23/2021   HCT 38.0 04/23/2021   MCV 87.4 04/23/2021   PLT 201 04/23/2021   Lab Results  Component Value Date   NA 134 (L) 04/23/2021   K 4.3 04/23/2021   CO2 23 04/23/2021   GLUCOSE 245 (H) 04/23/2021   BUN 18 04/23/2021   CREATININE 0.89 04/23/2021   BILITOT 0.3 07/29/2020   ALKPHOS 47 07/29/2020   AST 28 07/29/2020   ALT 20 07/29/2020   PROT 6.5 07/29/2020   ALBUMIN 4.0 07/29/2020   CALCIUM 9.1 04/23/2021   ANIONGAP 12 04/23/2021   EGFR 68 07/29/2020   GFR 67.41 05/13/2020   Lab Results  Component Value Date   CHOL 142 07/29/2020   Lab Results  Component Value Date   HDL 38 (L) 07/29/2020   Lab Results  Component Value Date   LDLCALC 73 07/29/2020   Lab Results  Component Value Date   TRIG 186 (H) 07/29/2020   Lab Results  Component Value Date   CHOLHDL 3.7 07/29/2020   Lab Results  Component Value Date   HGBA1C 6.5 (A) 12/15/2020       Assessment & Plan:   Problem List Items Addressed This Visit       Cardiovascular and Mediastinum   Essential hypertension - Primary   Relevant Medications   pravastatin (PRAVACHOL) 20 MG tablet   losartan-hydrochlorothiazide (HYZAAR) 100-12.5 MG tablet     Other   Mixed hyperlipidemia   Relevant Medications   pravastatin (PRAVACHOL) 20 MG tablet   losartan-hydrochlorothiazide (HYZAAR) 100-12.5 MG tablet   Lipoma of both lower extremities     Meds ordered this encounter  Medications   pravastatin (PRAVACHOL) 20 MG tablet    Sig: Take 1 tablet (20 mg total) by mouth daily.    Dispense:  90 tablet    Refill:  1    Order Specific Question:   Supervising  Provider    Answer:   Denita Lung [[id number]]   losartan-hydrochlorothiazide (HYZAAR) 100-12.5 MG tablet    Sig: Take 1 tablet by mouth daily.    Dispense:  90 tablet    Refill:  1    Order Specific Question:   Supervising Provider    Answer:   Denita Lung [[id number]]   Return in 2 months for an annual exam with labs, when her Prolia injection is due  Irene Pap, PA-C

## 2021-06-10 ENCOUNTER — Encounter: Payer: Self-pay | Admitting: Physician Assistant

## 2021-06-10 ENCOUNTER — Ambulatory Visit (INDEPENDENT_AMBULATORY_CARE_PROVIDER_SITE_OTHER): Payer: Medicare HMO | Admitting: Physician Assistant

## 2021-06-10 VITALS — BP 140/90 | HR 74 | Ht 64.0 in | Wt 195.0 lb

## 2021-06-10 DIAGNOSIS — E782 Mixed hyperlipidemia: Secondary | ICD-10-CM | POA: Diagnosis not present

## 2021-06-10 DIAGNOSIS — D1723 Benign lipomatous neoplasm of skin and subcutaneous tissue of right leg: Secondary | ICD-10-CM

## 2021-06-10 DIAGNOSIS — E785 Hyperlipidemia, unspecified: Secondary | ICD-10-CM | POA: Diagnosis not present

## 2021-06-10 DIAGNOSIS — D1724 Benign lipomatous neoplasm of skin and subcutaneous tissue of left leg: Secondary | ICD-10-CM | POA: Diagnosis not present

## 2021-06-10 DIAGNOSIS — E1169 Type 2 diabetes mellitus with other specified complication: Secondary | ICD-10-CM

## 2021-06-10 DIAGNOSIS — K219 Gastro-esophageal reflux disease without esophagitis: Secondary | ICD-10-CM | POA: Diagnosis not present

## 2021-06-10 DIAGNOSIS — I10 Essential (primary) hypertension: Secondary | ICD-10-CM | POA: Diagnosis not present

## 2021-06-10 MED ORDER — LOSARTAN POTASSIUM-HCTZ 100-12.5 MG PO TABS: 1.0000 | ORAL_TABLET | Freq: Every day | ORAL | 1 refills | Status: DC

## 2021-06-10 MED ORDER — PRAVASTATIN SODIUM 20 MG PO TABS: 20.0000 mg | ORAL_TABLET | Freq: Every day | ORAL | 1 refills | Status: DC

## 2021-06-14 ENCOUNTER — Telehealth: Payer: Self-pay | Admitting: Physician Assistant

## 2021-06-14 NOTE — Telephone Encounter (Signed)
Spoke with patient to schedule Medicare Annual Wellness Visit (AWV) either virtually or in office. ? ?Patient declined stating she has a cpe scheduled 08/29/21 with pcp  ? ?Last AWV ;07/29/20  ?please schedule at anytime with health coach ? ?This should be a 45 minute visit.   ? ?Awv can be done calendar year with  [place] insurance  ?

## 2021-06-16 NOTE — Telephone Encounter (Signed)
Pt. Scheduled with you on 08/29/21. ?

## 2021-06-16 NOTE — Telephone Encounter (Signed)
Please contact patient to schedule a Medicare Annual Wellness visit with me. Thanks.

## 2021-07-21 ENCOUNTER — Other Ambulatory Visit: Payer: Self-pay | Admitting: Cardiovascular Disease

## 2021-07-21 DIAGNOSIS — I10 Essential (primary) hypertension: Secondary | ICD-10-CM

## 2021-07-26 DIAGNOSIS — S0101XA Laceration without foreign body of scalp, initial encounter: Secondary | ICD-10-CM | POA: Diagnosis not present

## 2021-08-02 ENCOUNTER — Other Ambulatory Visit: Payer: Self-pay | Admitting: Cardiovascular Disease

## 2021-08-02 DIAGNOSIS — I10 Essential (primary) hypertension: Secondary | ICD-10-CM

## 2021-08-05 DIAGNOSIS — Z4802 Encounter for removal of sutures: Secondary | ICD-10-CM | POA: Diagnosis not present

## 2021-08-12 ENCOUNTER — Other Ambulatory Visit (HOSPITAL_BASED_OUTPATIENT_CLINIC_OR_DEPARTMENT_OTHER): Payer: Self-pay | Admitting: Physician Assistant

## 2021-08-12 DIAGNOSIS — Z1231 Encounter for screening mammogram for malignant neoplasm of breast: Secondary | ICD-10-CM

## 2021-08-17 ENCOUNTER — Telehealth: Payer: Self-pay | Admitting: Physician Assistant

## 2021-08-17 NOTE — Telephone Encounter (Signed)
Pt called concerning Prolia. PT advised that medication had been approved by her insurance cost and her estimated cost will be $302.  Pt added to her 08/29/2021 appt. Medication ordered.  ?

## 2021-08-26 ENCOUNTER — Inpatient Hospital Stay (HOSPITAL_BASED_OUTPATIENT_CLINIC_OR_DEPARTMENT_OTHER): Admission: RE | Admit: 2021-08-26 | Payer: Medicare HMO | Source: Ambulatory Visit | Admitting: Radiology

## 2021-08-29 ENCOUNTER — Ambulatory Visit: Payer: Medicare HMO | Admitting: Physician Assistant

## 2021-08-29 ENCOUNTER — Encounter: Payer: Self-pay | Admitting: Physician Assistant

## 2021-08-29 VITALS — BP 140/80 | HR 67 | Ht 64.0 in | Wt 195.6 lb

## 2021-08-29 DIAGNOSIS — Z1211 Encounter for screening for malignant neoplasm of colon: Secondary | ICD-10-CM

## 2021-08-29 DIAGNOSIS — M810 Age-related osteoporosis without current pathological fracture: Secondary | ICD-10-CM | POA: Diagnosis not present

## 2021-08-29 DIAGNOSIS — E1165 Type 2 diabetes mellitus with hyperglycemia: Secondary | ICD-10-CM | POA: Diagnosis not present

## 2021-08-29 DIAGNOSIS — I10 Essential (primary) hypertension: Secondary | ICD-10-CM | POA: Diagnosis not present

## 2021-08-29 DIAGNOSIS — E785 Hyperlipidemia, unspecified: Secondary | ICD-10-CM

## 2021-08-29 DIAGNOSIS — E1169 Type 2 diabetes mellitus with other specified complication: Secondary | ICD-10-CM | POA: Diagnosis not present

## 2021-08-29 DIAGNOSIS — Z0000 Encounter for general adult medical examination without abnormal findings: Secondary | ICD-10-CM

## 2021-08-29 MED ORDER — LOSARTAN POTASSIUM-HCTZ 100-12.5 MG PO TABS: 1.0000 | ORAL_TABLET | Freq: Every day | ORAL | 2 refills | Status: DC

## 2021-08-29 MED ORDER — PRAVASTATIN SODIUM 20 MG PO TABS: 20.0000 mg | ORAL_TABLET | Freq: Every day | ORAL | 2 refills | Status: DC

## 2021-08-29 MED ORDER — DENOSUMAB 60 MG/ML ~~LOC~~ SOSY
60.0000 mg | PREFILLED_SYRINGE | Freq: Once | SUBCUTANEOUS | Status: AC
  Administered 2021-08-29: 60 mg via SUBCUTANEOUS

## 2021-08-29 NOTE — Assessment & Plan Note (Signed)
controlled, eat a low salt diet, do not add any salt to food when cooking, avoid processed foods, avoid fried foods ? ?

## 2021-08-29 NOTE — Assessment & Plan Note (Signed)
controlled, lipid panel drawn today, eat a low fat diet, increase fiber intake (Benefiber or Metamucil, Cherrios,  oatmeal, beans, nuts, fruits and vegetables), limit saturated fats (in fried foods, red meat), can add OTC fish oil supplement, eat fish with Omega-3 fatty acids like salmon and tuna, exercise for 30 minutes 3 - 5 times a week, drink 8 - 10 glasses of water a day

## 2021-08-29 NOTE — Assessment & Plan Note (Addendum)
controlled, eat a low sugar diet, avoid starchy food with a lot of carbohydrates, avoid fried and processed foods; last hgb a1c 6.5 on 12/15/2020

## 2021-08-29 NOTE — Progress Notes (Signed)
[name] is a 76 y.o. female who presents for annual wellness visit and follow-up on chronic medical conditions.    She has the following concerns:  Reports she is generally feeling well; is eating a diabetic diet; is sleeping well 8 hours; drinks 8 glasses of water a day; is exercising with walking 3 days a week and tries to stay active; home BP 125/61, 123/61, 135/66, 145/73, 141/67 from 08/27/21 to 08/29/21; states she will find a new eye Dr and is due an exam   Immunizations and Health Maintenance Immunization History  Administered Date(s) Administered   Fluad Quad(high Dose 65+) 03/20/2019, 01/28/2020, 02/17/2021   Influenza, High Dose Seasonal PF 01/07/2018   Pneumococcal Conjugate-13 02/05/2017   Pneumococcal Polysaccharide-23 04/24/2018   Health Maintenance  Topic Date Due   HEMOGLOBIN A1C  06/14/2021   Zoster Vaccines- Shingrix (1 of 2) 09/10/2021 (Originally 10/11/1964)   OPHTHALMOLOGY EXAM  01/23/2022 (Originally 03/03/2019)   TETANUS/TDAP  06/11/2022 (Originally 10/11/1964)   INFLUENZA VACCINE  11/08/2021   FOOT EXAM  12/15/2021   MAMMOGRAM  10/05/2022   COLONOSCOPY (Pts 45-81yr Insurance coverage will need to be confirmed)  01/05/2028   Pneumonia Vaccine 76 Years old  Completed   DEXA SCAN  Completed   Hepatitis C Screening  Completed   HPV VACCINES  Aged Out   COVID-19 Vaccine  Discontinued     The 10-year ASCVD risk score (Arnett DK, et al., 2019) is: 34.4%  Dentist: None pt has dentures Ophtho: looking for a new one Exercise: walking 3 times a week  Patient Care Team: WMarcellina Millinas PCP - General (Physician Assistant) BLorretta Harp MD as PCP - Cardiology (Cardiology) GNicholas Lose MD as Consulting Physician (Hematology and Oncology) SEppie Gibson MD as Attending Physician (Radiation Oncology) CErroll Luna MD as Consulting Physician (General Surgery) Causey, LCharlestine Massed NP as Nurse Practitioner (Hematology and  Oncology) ERenato Shin MD as Consulting Physician (Endocrinology)   Advanced directives:    Depression screen:  See questionnaire below.     08/29/2021   10:48 AM 06/10/2021   10:26 AM 07/29/2020    8:40 AM 07/28/2019    8:26 AM 04/29/2019   11:09 AM  Depression screen PHQ 2/9  Decreased Interest 0 0 0 0 0  Down, Depressed, Hopeless 0 0 0 0 0  PHQ - 2 Score 0 0 0 0 0    Fall Risk Screen: see questionnaire below.    08/29/2021   10:47 AM 06/10/2021   10:26 AM 07/29/2020    8:39 AM 07/28/2019    8:26 AM 04/29/2019   11:09 AM  Fall Risk   Falls in the past year? 0 0 0 0 0  Number falls in past yr: 0 0 0 0 0  Injury with Fall? 0 0 0 0 0  Risk for fall due to : History of fall(s) No Fall Risks No Fall Risks    Follow up Falls evaluation completed Falls evaluation completed Falls evaluation completed      ADL screen:  See questionnaire below Functional Status Survey:     Review of Systems  BP 140/80   Pulse 67   Wt 195 lb 9.6 oz (88.7 kg)   SpO2 100%   BMI 33.57 kg/m   Physical Exam  ASSESSMENT/PLAN  Problem List Items Addressed This Visit       Cardiovascular and Mediastinum   Essential hypertension    controlled, eat a low salt diet, do not add any  salt to food when cooking, avoid processed foods, avoid fried foods        Relevant Medications   losartan-hydrochlorothiazide (HYZAAR) 100-12.5 MG tablet   pravastatin (PRAVACHOL) 20 MG tablet   Other Relevant Orders   Comprehensive metabolic panel     Endocrine   Hyperlipidemia associated with type 2 diabetes mellitus ([employer])     controlled, lipid panel drawn today, eat a low fat diet, increase fiber intake (Benefiber or Metamucil, Cherrios,  oatmeal, beans, nuts, fruits and vegetables), limit saturated fats (in fried foods, red meat), can add OTC fish oil supplement, eat fish with Omega-3 fatty acids like salmon and tuna, exercise for 30 minutes 3 - 5 times a week, drink 8 - 10 glasses of water a day         Relevant Medications   losartan-hydrochlorothiazide (HYZAAR) 100-12.5 MG tablet   pravastatin (PRAVACHOL) 20 MG tablet   Other Relevant Orders   Lipid panel   Type 2 diabetes mellitus with hyperglycemia, without long-term current use of insulin (HCC)    controlled, eat a low sugar diet, avoid starchy food with a lot of carbohydrates, avoid fried and processed foods; last hgb a1c 6.5 on 12/15/2020       Relevant Medications   losartan-hydrochlorothiazide (HYZAAR) 100-12.5 MG tablet   pravastatin (PRAVACHOL) 20 MG tablet   Other Relevant Orders   Comprehensive metabolic panel   Hemoglobin A1c     Musculoskeletal and Integument   Osteoporosis    Stable, prolia injection today       Other Visit Diagnoses     Medicare annual wellness visit, subsequent    -  Primary   Relevant Orders   CBC with Differential/Platelet   Comprehensive metabolic panel   Hemoglobin A1c   Lipid panel   Colon cancer screening       Relevant Orders   Ambulatory referral to Gastroenterology       Discussed monthly self breast exams and yearly mammograms; at least 30 minutes of aerobic activity at least 5 days/week and weight-bearing exercise 2x/week; proper sunscreen use reviewed; healthy diet, including goals of calcium and vitamin D intake and alcohol recommendations (less than or equal to 1 drink/day) reviewed; regular seatbelt use; changing batteries in smoke detectors.  Immunization recommendations discussed.  Colonoscopy recommendations reviewed   Medicare Attestation I have personally reviewed: The patient's medical and social history Their use of alcohol, tobacco or illicit drugs Their current medications and supplements The patient's functional ability including ADLs,fall risks, home safety risks, cognitive, and hearing and visual impairment Diet and physical activities Evidence for depression or mood disorders  The patient's weight, height, and BMI have been recorded in the chart.  I have  made referrals, counseling, and provided education to the patient based on review of the above and I have provided the patient with a written personalized care plan for preventive services.    No follow-ups on file.    Irene Pap, PA-C   08/29/2021

## 2021-08-29 NOTE — Assessment & Plan Note (Signed)
>>  ASSESSMENT AND PLAN FOR TYPE 2 DIABETES MELLITUS WITH HYPERGLYCEMIA, WITHOUT LONG-TERM CURRENT USE OF INSULIN (HCC) WRITTEN ON 08/29/2021  2:41 PM BY Mayford Knife, LYNNE B, PA-C  controlled, eat a low sugar diet, avoid starchy food with a lot of carbohydrates, avoid fried and processed foods; last hgb a1c 6.5 on 12/15/2020

## 2021-08-29 NOTE — Assessment & Plan Note (Signed)
Stable, prolia injection today

## 2021-08-30 LAB — CBC WITH DIFFERENTIAL/PLATELET
Basophils Absolute: 0.1 10*3/uL (ref 0.0–0.2)
Basos: 1 %
EOS (ABSOLUTE): 0.4 10*3/uL (ref 0.0–0.4)
Eos: 3 %
Hematocrit: 37.3 % (ref 34.0–46.6)
Hemoglobin: 12.2 g/dL (ref 11.1–15.9)
Immature Grans (Abs): 0.1 10*3/uL (ref 0.0–0.1)
Immature Granulocytes: 1 %
Lymphocytes Absolute: 2.6 10*3/uL (ref 0.7–3.1)
Lymphs: 23 %
MCH: 28.8 pg (ref 26.6–33.0)
MCHC: 32.7 g/dL (ref 31.5–35.7)
MCV: 88 fL (ref 79–97)
Monocytes Absolute: 0.8 10*3/uL (ref 0.1–0.9)
Monocytes: 7 %
Neutrophils Absolute: 7.4 10*3/uL — ABNORMAL HIGH (ref 1.4–7.0)
Neutrophils: 65 %
Platelets: 268 10*3/uL (ref 150–450)
RBC: 4.23 x10E6/uL (ref 3.77–5.28)
RDW: 13.8 % (ref 11.7–15.4)
WBC: 11.4 10*3/uL — ABNORMAL HIGH (ref 3.4–10.8)

## 2021-08-30 LAB — COMPREHENSIVE METABOLIC PANEL
ALT: 18 IU/L (ref 0–32)
AST: 18 IU/L (ref 0–40)
Albumin/Globulin Ratio: 2 (ref 1.2–2.2)
Albumin: 4.1 g/dL (ref 3.7–4.7)
Alkaline Phosphatase: 48 IU/L (ref 44–121)
BUN/Creatinine Ratio: 20 (ref 12–28)
BUN: 15 mg/dL (ref 8–27)
Bilirubin Total: 0.3 mg/dL (ref 0.0–1.2)
CO2: 20 mmol/L (ref 20–29)
Calcium: 8.9 mg/dL (ref 8.7–10.3)
Chloride: 104 mmol/L (ref 96–106)
Creatinine, Ser: 0.75 mg/dL (ref 0.57–1.00)
Globulin, Total: 2.1 g/dL (ref 1.5–4.5)
Glucose: 115 mg/dL — ABNORMAL HIGH (ref 70–99)
Potassium: 3.5 mmol/L (ref 3.5–5.2)
Sodium: 140 mmol/L (ref 134–144)
Total Protein: 6.2 g/dL (ref 6.0–8.5)
eGFR: 84 mL/min/{1.73_m2} (ref >59)

## 2021-08-30 LAB — HEMOGLOBIN A1C
Est. average glucose Bld gHb Est-mCnc: 200 mg/dL
Hgb A1c MFr Bld: 8.6 % — ABNORMAL HIGH (ref 4.8–5.6)

## 2021-08-30 LAB — LIPID PANEL
Chol/HDL Ratio: 3.1 ratio (ref 0.0–4.4)
Cholesterol, Total: 124 mg/dL (ref 100–199)
HDL: 40 mg/dL (ref >39)
LDL Chol Calc (NIH): 54 mg/dL (ref 0–99)
Triglycerides: 180 mg/dL — ABNORMAL HIGH (ref 0–149)
VLDL Cholesterol Cal: 30 mg/dL (ref 5–40)

## 2021-09-08 HISTORY — PX: CHOLECYSTECTOMY: SHX55

## 2021-09-18 ENCOUNTER — Other Ambulatory Visit: Payer: Self-pay

## 2021-09-18 ENCOUNTER — Emergency Department (HOSPITAL_COMMUNITY): Payer: Medicare HMO

## 2021-09-18 ENCOUNTER — Emergency Department (HOSPITAL_COMMUNITY)
Admission: EM | Admit: 2021-09-18 | Discharge: 2021-09-19 | Disposition: A | Discharge status: Home / Self Care | Payer: Medicare HMO | Attending: Emergency Medicine | Admitting: Emergency Medicine

## 2021-09-18 DIAGNOSIS — Z7984 Long term (current) use of oral hypoglycemic drugs: Secondary | ICD-10-CM | POA: Diagnosis not present

## 2021-09-18 DIAGNOSIS — K449 Diaphragmatic hernia without obstruction or gangrene: Secondary | ICD-10-CM | POA: Diagnosis not present

## 2021-09-18 DIAGNOSIS — Z79899 Other long term (current) drug therapy: Secondary | ICD-10-CM | POA: Insufficient documentation

## 2021-09-18 DIAGNOSIS — I10 Essential (primary) hypertension: Secondary | ICD-10-CM | POA: Insufficient documentation

## 2021-09-18 DIAGNOSIS — Z79810 Long term (current) use of selective estrogen receptor modulators (SERMs): Secondary | ICD-10-CM | POA: Insufficient documentation

## 2021-09-18 DIAGNOSIS — R079 Chest pain, unspecified: Secondary | ICD-10-CM | POA: Diagnosis not present

## 2021-09-18 DIAGNOSIS — N281 Cyst of kidney, acquired: Secondary | ICD-10-CM | POA: Diagnosis not present

## 2021-09-18 DIAGNOSIS — I3139 Other pericardial effusion (noninflammatory): Secondary | ICD-10-CM | POA: Diagnosis not present

## 2021-09-18 DIAGNOSIS — R0602 Shortness of breath: Secondary | ICD-10-CM | POA: Diagnosis not present

## 2021-09-18 DIAGNOSIS — C50919 Malignant neoplasm of unspecified site of unspecified female breast: Secondary | ICD-10-CM | POA: Insufficient documentation

## 2021-09-18 DIAGNOSIS — R069 Unspecified abnormalities of breathing: Secondary | ICD-10-CM | POA: Diagnosis not present

## 2021-09-18 DIAGNOSIS — J9811 Atelectasis: Secondary | ICD-10-CM | POA: Diagnosis not present

## 2021-09-18 DIAGNOSIS — K5730 Diverticulosis of large intestine without perforation or abscess without bleeding: Secondary | ICD-10-CM | POA: Diagnosis not present

## 2021-09-18 DIAGNOSIS — M549 Dorsalgia, unspecified: Secondary | ICD-10-CM | POA: Diagnosis not present

## 2021-09-18 DIAGNOSIS — E119 Type 2 diabetes mellitus without complications: Secondary | ICD-10-CM | POA: Insufficient documentation

## 2021-09-18 DIAGNOSIS — R000 Tachycardia, unspecified: Secondary | ICD-10-CM | POA: Diagnosis not present

## 2021-09-18 DIAGNOSIS — J90 Pleural effusion, not elsewhere classified: Secondary | ICD-10-CM | POA: Diagnosis not present

## 2021-09-18 DIAGNOSIS — K8020 Calculus of gallbladder without cholecystitis without obstruction: Secondary | ICD-10-CM | POA: Diagnosis not present

## 2021-09-18 DIAGNOSIS — R0789 Other chest pain: Secondary | ICD-10-CM | POA: Diagnosis not present

## 2021-09-18 DIAGNOSIS — R42 Dizziness and giddiness: Secondary | ICD-10-CM | POA: Diagnosis not present

## 2021-09-18 LAB — CBC
HCT: 37.9 % (ref 36.0–46.0)
Hemoglobin: 12.4 g/dL (ref 12.0–15.0)
MCH: 29.2 pg (ref 26.0–34.0)
MCHC: 32.7 g/dL (ref 30.0–36.0)
MCV: 89.2 fL (ref 80.0–100.0)
Platelets: 315 10*3/uL (ref 150–400)
RBC: 4.25 MIL/uL (ref 3.87–5.11)
RDW: 13.9 % (ref 11.5–15.5)
WBC: 12.2 10*3/uL — ABNORMAL HIGH (ref 4.0–10.5)
nRBC: 0 % (ref 0.0–0.2)

## 2021-09-18 NOTE — ED Triage Notes (Addendum)
Pt brought to ED by [facility] with c/o pain to center of chest that started as sharp and is now pressure that radiates to LLQ of abdomen. ASA '324mg'$  administered PTA.  18g IV Rt hand  EMS Vitals BP 234/140 HR 96-120 RR 22 SPO2 99% 2L [place] CBG 229

## 2021-09-18 NOTE — ED Provider Triage Note (Signed)
  Emergency Medicine Provider Triage Evaluation Note  MRN:  [id number]  Arrival date & time: 09/18/21    Medically screening exam initiated at 10:39 PM.   CC:   Chest Pain   HPI:  [name] is a 76 y.o. year-old female presents to the ED with chief complaint of chest pain.  Onset tonight about 9:30pm.  Reports associated SOB.  Has had some intermittent right arm numbness along with LLQ pain.  History provided by History provided by patient. ROS:  -As included in HPI PE:   Vitals:   09/18/21 2240  BP: (!) 216/113  Pulse: (!) 102  Resp: 16  Temp: 98.1 F (36.7 C)  SpO2: 96%    Non-toxic appearing No respiratory distress Moving all extremities, normal distal radial pulses MDM:  Based on signs and symptoms, ACS/dissection is highest on my differential. I've ordered labs and imaging in triage to expedite lab/diagnostic workup.  Patient was informed that the remainder of the evaluation will be completed by another provider, this initial triage assessment does not replace that evaluation, and the importance of remaining in the ED until their evaluation is complete.    Montine Circle, PA-C 09/18/21 [id number]

## 2021-09-19 ENCOUNTER — Encounter (HOSPITAL_COMMUNITY): Payer: Self-pay

## 2021-09-19 ENCOUNTER — Emergency Department (HOSPITAL_COMMUNITY): Payer: Medicare HMO

## 2021-09-19 DIAGNOSIS — K8020 Calculus of gallbladder without cholecystitis without obstruction: Secondary | ICD-10-CM | POA: Diagnosis not present

## 2021-09-19 DIAGNOSIS — I3139 Other pericardial effusion (noninflammatory): Secondary | ICD-10-CM | POA: Diagnosis not present

## 2021-09-19 DIAGNOSIS — J90 Pleural effusion, not elsewhere classified: Secondary | ICD-10-CM | POA: Diagnosis not present

## 2021-09-19 DIAGNOSIS — N281 Cyst of kidney, acquired: Secondary | ICD-10-CM | POA: Diagnosis not present

## 2021-09-19 DIAGNOSIS — K449 Diaphragmatic hernia without obstruction or gangrene: Secondary | ICD-10-CM | POA: Diagnosis not present

## 2021-09-19 DIAGNOSIS — M549 Dorsalgia, unspecified: Secondary | ICD-10-CM | POA: Diagnosis not present

## 2021-09-19 DIAGNOSIS — K5730 Diverticulosis of large intestine without perforation or abscess without bleeding: Secondary | ICD-10-CM | POA: Diagnosis not present

## 2021-09-19 LAB — BASIC METABOLIC PANEL
Anion gap: 11 (ref 5–15)
BUN: 10 mg/dL (ref 8–23)
CO2: 21 mmol/L — ABNORMAL LOW (ref 22–32)
Calcium: 8.8 mg/dL — ABNORMAL LOW (ref 8.9–10.3)
Chloride: 104 mmol/L (ref 98–111)
Creatinine, Ser: 0.89 mg/dL (ref 0.44–1.00)
GFR, Estimated: >60 mL/min (ref >60)
Glucose, Bld: 248 mg/dL — ABNORMAL HIGH (ref 70–99)
Potassium: 3.5 mmol/L (ref 3.5–5.1)
Sodium: 136 mmol/L (ref 135–145)

## 2021-09-19 LAB — HEPATIC FUNCTION PANEL
ALT: 21 U/L (ref 0–44)
AST: 27 U/L (ref 15–41)
Albumin: 3.4 g/dL — ABNORMAL LOW (ref 3.5–5.0)
Alkaline Phosphatase: 51 U/L (ref 38–126)
Bilirubin, Direct: 0.2 mg/dL (ref 0.0–0.2)
Indirect Bilirubin: 0.4 mg/dL (ref 0.3–0.9)
Total Bilirubin: 0.6 mg/dL (ref 0.3–1.2)
Total Protein: 6.4 g/dL — ABNORMAL LOW (ref 6.5–8.1)

## 2021-09-19 LAB — TROPONIN I (HIGH SENSITIVITY)
Troponin I (High Sensitivity): 15 ng/L (ref <18)
Troponin I (High Sensitivity): 21 ng/L — ABNORMAL HIGH (ref <18)
Troponin I (High Sensitivity): 18 ng/L — ABNORMAL HIGH (ref <18)

## 2021-09-19 LAB — BRAIN NATRIURETIC PEPTIDE: B Natriuretic Peptide: 115.4 pg/mL — ABNORMAL HIGH (ref 0.0–100.0)

## 2021-09-19 LAB — LIPASE, BLOOD: Lipase: 22 U/L (ref 11–51)

## 2021-09-19 MED ORDER — METOPROLOL TARTRATE 5 MG/5ML IV SOLN
5.0000 mg | Freq: Once | INTRAVENOUS | Status: AC
  Administered 2021-09-19: 5 mg via INTRAVENOUS
  Filled 2021-09-19: qty 5

## 2021-09-19 MED ORDER — IOHEXOL 350 MG/ML SOLN
100.0000 mL | Freq: Once | INTRAVENOUS | Status: AC | PRN
  Administered 2021-09-19: 100 mL via INTRAVENOUS

## 2021-09-19 MED ORDER — CARVEDILOL 12.5 MG PO TABS
25.0000 mg | ORAL_TABLET | Freq: Two times a day (BID) | ORAL | Status: DC
  Administered 2021-09-19: 25 mg via ORAL
  Filled 2021-09-19: qty 2

## 2021-09-19 MED ORDER — LOSARTAN POTASSIUM 50 MG PO TABS
100.0000 mg | ORAL_TABLET | ORAL | Status: AC
  Administered 2021-09-19: 100 mg via ORAL
  Filled 2021-09-19: qty 2

## 2021-09-19 NOTE — Discharge Instructions (Signed)
Your work-up today was quite reassuring.  Please follow-up with Dr. Alvester Chou your cardiologist.  I am glad that your symptoms are completely resolved.  If you have any new or concerning symptoms please return to the emergency room.  Take your evening blood pressure medications as prescribed.

## 2021-09-19 NOTE — ED Provider Notes (Signed)
[facility] EMERGENCY DEPARTMENT Provider Note   CSN: [id number] Arrival date & time: 09/18/21  2228     History  Chief Complaint  Patient presents with   Chest Pain    [name] is a 76 y.o. female.   Chest Pain  76 yo female with history of breast cancer and hypertension presents with chest pain and shortness of breath. The patient states her pain started last night about an hour after eating dinner. She describes the pain as chest pressure, she also experienced shortness of breath at rest, nausea, and tingling in her right arm. The symptoms resolved after two hours, she is not experiencing any pain currently but  still has some chest pressure. Denies palpitations, fever, chills, vomiting, diarrhea, dysuria. Pertinent medical history includes breast cancer, currently being treated with chemo and radiation, hypertension, DMII, and history of gallstones.        Home Medications Prior to Admission medications   Medication Sig Start Date End Date Taking? Authorizing Provider  amLODipine (NORVASC) 10 MG tablet TAKE 1 TABLET BY MOUTH EVERY DAY 07/21/21  Yes Lorretta Harp, MD  aspirin EC 81 MG tablet Take 81 mg by mouth daily.   Yes [provider]  calcium-vitamin D (OSCAL WITH D) 250-125 MG-UNIT tablet Take 1 tablet by mouth daily.   Yes [provider]  carvedilol (COREG) 25 MG tablet TAKE 1 TABLET BY MOUTH TWICE A DAY 07/21/21  Yes Lorretta Harp, MD  Cholecalciferol (VITAMIN D3) 1.25 MG (50000 UT) CAPS Take 50,000 Units by mouth daily.   Yes [provider]  Cinnamon 500 MG capsule Take 500 mg by mouth daily.   Yes [provider]  losartan-hydrochlorothiazide (HYZAAR) 100-12.5 MG tablet Take 1 tablet by mouth daily. 08/29/21  Yes Irene Pap, PA-C  metFORMIN (GLUCOPHAGE-XR) 500 MG 24 hr tablet Take 4 tablets (2,000 mg total) by mouth daily with breakfast. 12/15/20  Yes Renato Shin, MD  pravastatin (PRAVACHOL)  20 MG tablet Take 1 tablet (20 mg total) by mouth daily. 08/29/21  Yes Irene Pap, PA-C  PROLIA 60 MG/ML SOLN injection  07/04/17  Yes [provider]  tamoxifen (NOLVADEX) 20 MG tablet Take 1 tablet (20 mg total) by mouth daily. 06/02/21  Yes Nicholas Lose, MD  vitamin C (ASCORBIC ACID) 500 MG tablet Take 500 mg by mouth daily.   Yes [provider]  Accu-Chek Softclix Lancets lancets 1 each by Other route 2 (two) times daily. Used to check blood sugars twice daily. DX code E11.9.    [provider]  Blood Glucose Monitoring Suppl (ACCU-CHEK AVIVA PLUS) w/Device KIT USE AS DIRECTED 2 TIMES DAILY 03/20/17   Henson, [name] L, NP-C  glucose blood test strip Used to check blood sugars twice daily. DX code E11.9. 08/10/17   Renato Shin, MD      Allergies    Atorvastatin, Fosamax [alendronate sodium], Lisinopril, and Tetracycline    Review of Systems   Review of Systems  Cardiovascular:  Positive for chest pain.    Physical Exam Updated Vital Signs BP (!) 176/76   Pulse 67   Temp (!) 97.4 F (36.3 C)   Resp (!) 21   SpO2 97%  Physical Exam Vitals and nursing note reviewed.  Constitutional:      General: She is not in acute distress. HENT:     Head: Normocephalic and atraumatic.     Nose: Nose normal.  Eyes:     General: No  scleral icterus. Cardiovascular:     Rate and Rhythm: Normal rate and regular rhythm.     Pulses: Normal pulses.     Heart sounds: Normal heart sounds.  Pulmonary:     Effort: Pulmonary effort is normal. No respiratory distress.     Breath sounds: No wheezing.  Abdominal:     Palpations: Abdomen is soft.     Tenderness: There is no abdominal tenderness.  Musculoskeletal:     Cervical back: Normal range of motion.     Right lower leg: No edema.     Left lower leg: No edema.     Comments: BL symmetric non-pitting edema No calf TTP  Skin:    General: Skin is warm and dry.     Capillary Refill: Capillary refill takes less  than 2 seconds.  Neurological:     Mental Status: She is alert. Mental status is at baseline.  Psychiatric:        Mood and Affect: Mood normal.        Behavior: Behavior normal.      ED Results / Procedures / Treatments   Labs (all labs ordered are listed, but only abnormal results are displayed) Labs Reviewed  BASIC METABOLIC PANEL - Abnormal; Notable for the following components:      Result Value   CO2 21 (*)    Glucose, Bld 248 (*)    Calcium 8.8 (*)    All other components within normal limits  CBC - Abnormal; Notable for the following components:   WBC 12.2 (*)    All other components within normal limits  HEPATIC FUNCTION PANEL - Abnormal; Notable for the following components:   Total Protein 6.4 (*)    Albumin 3.4 (*)    All other components within normal limits  BRAIN NATRIURETIC PEPTIDE - Abnormal; Notable for the following components:   B Natriuretic Peptide 115.4 (*)    All other components within normal limits  TROPONIN I (HIGH SENSITIVITY) - Abnormal; Notable for the following components:   Troponin I (High Sensitivity) 21 (*)    All other components within normal limits  TROPONIN I (HIGH SENSITIVITY) - Abnormal; Notable for the following components:   Troponin I (High Sensitivity) 18 (*)    All other components within normal limits  LIPASE, BLOOD  TROPONIN I (HIGH SENSITIVITY)    EKG EKG Interpretation  Date/Time:  Monday September 19 2021 00:12:54 EDT Ventricular Rate:  93 PR Interval:  132 QRS Duration: 72 QT Interval:  376 QTC Calculation: 467 R Axis:   65 Text Interpretation: Sinus rhythm with Premature supraventricular complexes Otherwise normal ECG When compared with ECG of 18-Sep-2021 22:44, PREVIOUS ECG IS PRESENT similar to prior Confirmed by Wynona Dove ([id number]) on 09/19/2021 8:19:27 AM  Radiology US Abdomen Limited RUQ (LIVER/GB)  Result Date: 09/19/2021 CLINICAL DATA:  Follow-up from recent CT 4 gallbladder findings. EXAM: ULTRASOUND ABDOMEN  LIMITED RIGHT UPPER QUADRANT COMPARISON:  CT of the abdomen pelvis September 19, 2021 FINDINGS: Gallbladder: No wall thickening visualized. Mobile gallstones are noted in the gallbladder. No sonographic Murphy sign noted by sonographer. Common bile duct: Diameter: 4.8 mm Liver: No focal lesion identified. Diffuse increased echotexture of the liver is noted. Portal vein is patent on color Doppler imaging with normal direction of blood flow towards the liver. Other: None. IMPRESSION: 1. Cholelithiasis without sonographic evidence of acute cholecystitis. 2. Fatty infiltration of liver. Electronically Signed   By: Abelardo Diesel M.D.   On: 09/19/2021 10:24   CT  Angio Chest/Abd/Pel for Dissection W and/or Wo Contrast  Result Date: 09/19/2021 CLINICAL DATA:  Chest and back pain.  Concern for aortic dissection. EXAM: CT ANGIOGRAPHY CHEST, ABDOMEN AND PELVIS TECHNIQUE: Non-contrast CT of the chest was initially obtained. Multidetector CT imaging through the chest, abdomen and pelvis was performed using the standard protocol during bolus administration of intravenous contrast. Multiplanar reconstructed images and MIPs were obtained and reviewed to evaluate the vascular anatomy. RADIATION DOSE REDUCTION: This exam was performed according to the departmental dose-optimization program which includes automated exposure control, adjustment of the mA and/or kV according to patient size and/or use of iterative reconstruction technique. CONTRAST:  146m OMNIPAQUE IOHEXOL 350 MG/ML SOLN COMPARISON:  Chest CT dated 11/13/2007 and radiograph dated 09/18/2021. FINDINGS: CTA CHEST FINDINGS Cardiovascular: No cardiomegaly. Small pericardial effusion measuring 5 mm in thickness. Moderate atherosclerotic calcification of the thoracic aorta. No aneurysmal dilatation or dissection. The origins of the great vessels of the aortic arch appear patent. No pulmonary artery embolus identified. Mediastinum/Nodes: No hilar or mediastinal adenopathy.  There is a moderate size hiatal hernia containing a portion of the stomach. The esophagus is grossly unremarkable. No mediastinal fluid collection. Lungs/Pleura: Small bilateral pleural effusions with partial compressive atelectasis of the lower lobes. Pneumonia is not excluded. No pneumothorax. The central airways are patent. Musculoskeletal: Osteopenia degenerative changes of the spine. Mild age indeterminate, chronic appearing compression fracture of superior endplate of TE33 Correlation with clinical exam and point tenderness recommended. Review of the MIP images confirms the above findings. CTA ABDOMEN AND PELVIS FINDINGS VASCULAR Aorta: Moderate atherosclerotic calcification. No aneurysmal dilatation or dissection. No periaortic fluid collection. Celiac: Patent without evidence of aneurysm, dissection, vasculitis or significant stenosis. SMA: Patent without evidence of aneurysm, dissection, vasculitis or significant stenosis. Renals: Both renal arteries are patent without evidence of aneurysm, dissection, vasculitis, fibromuscular dysplasia or significant stenosis. IMA: Patent without evidence of aneurysm, dissection, vasculitis or significant stenosis. Inflow: Moderate atherosclerotic calcification. No aneurysmal dilatation or dissection. Veins: No obvious venous abnormality within the limitations of this arterial phase study. Review of the MIP images confirms the above findings. NON-VASCULAR No intra-abdominal free air or free fluid. Hepatobiliary: Mild fatty liver. No intrahepatic biliary dilatation. Multiple gallstones. There is mild pericholecystic haziness. Ultrasound is recommended for better evaluation of the gallbladder. Pancreas: Unremarkable. No pancreatic ductal dilatation or surrounding inflammatory changes. Spleen: Normal in size without focal abnormality. Adrenals/Urinary Tract: The adrenal glands are unremarkable. There is a 17 mm complex cyst from the upper pole of the right kidney. Further  evaluation with ultrasound on a nonemergent/outpatient basis recommended. There is no hydronephrosis on either side. The visualized ureters and urinary bladder appear unremarkable.1 Stomach/Bowel: There is sigmoid diverticulosis without active inflammatory changes. No bowel obstruction. Appendectomy. Lymphatic: No adenopathy. Reproductive: The uterus is anteverted. Small partially calcified fibroids. No adnexal masses. Other: Mild diffuse subcutaneous edema.  No fluid collection. Musculoskeletal: Osteopenia with degenerative changes of the spine. No acute osseous pathology. Review of the MIP images confirms the above findings. IMPRESSION: 1. No aortic dissection or aneurysm. 2. Small bilateral pleural effusions. 3. Cholelithiasis with mild pericholecystic haziness. Ultrasound is recommended for better evaluation of the gallbladder. 4. Sigmoid diverticulosis without active inflammatory changes. No bowel obstruction. 5. Aortic Atherosclerosis (ICD10-I70.0). Electronically Signed   By: AAnner CreteM.D.   On: 09/19/2021 01:09   DG Chest 2 View  Result Date: 09/18/2021 CLINICAL DATA:  Chest pain EXAM: CHEST - 2 VIEW COMPARISON:  04/23/2021 FINDINGS: Cardiomegaly, vascular congestion. Small bilateral pleural effusions  with bibasilar atelectasis. No overt edema or acute bony abnormality. IMPRESSION: Cardiomegaly with vascular congestion. Small bilateral effusions with bibasilar atelectasis. Electronically Signed   By: Rolm Baptise M.D.   On: 09/18/2021 23:09    Procedures Procedures    Medications Ordered in ED Medications  carvedilol (COREG) tablet 25 mg (25 mg Oral Given 09/19/21 0902)  iohexol (OMNIPAQUE) 350 MG/ML injection 100 mL (100 mLs Intravenous Contrast Given 09/19/21 0041)  losartan (COZAAR) tablet 100 mg (100 mg Oral Given 09/19/21 0902)  metoprolol tartrate (LOPRESSOR) injection 5 mg (5 mg Intravenous Given 09/19/21 0902)    ED Course/ Medical Decision Making/ A&P                            Medical Decision Making Amount and/or Complexity of Data Reviewed Labs: ordered. Radiology: ordered.  Risk Prescription drug management.   This patient presents to the ED for concern of CP now resolved, this involves a number of treatment options, and is a complaint that carries with it a moderate to high risk of complications and morbidity.  The differential diagnosis includes The emergent causes of chest pain include: Acute coronary syndrome, tamponade, pericarditis/myocarditis, aortic dissection, pulmonary embolism, tension pneumothorax, pneumonia, and esophageal rupture.    Co morbidities: Discussed in HPI   Brief History:  76 yo female with history of breast cancer and hypertension presents with chest pain and shortness of breath. The patient states her pain started last night about an hour after eating dinner. She describes the pain as chest pressure, she also experienced shortness of breath at rest, nausea, and tingling in her right arm. The symptoms resolved after two hours, she is not experiencing any pain currently but  still has some chest pressure. Denies palpitations, fever, chills, vomiting, diarrhea, dysuria. Pertinent medical history includes breast cancer, currently being treated with chemo and radiation, hypertension, DMII, and history of gallstones.   Her symptoms are completely resolved at this time.  She states she feels well.  She did take her blood pressure medicines last yesterday evening.  She does not take any blood pressure medicines today her blood pressure is quite elevated with systolic at approximately 240.  I provided her with losartan carvedilol at her home doses and provided her with 1 dose of IV Lopressor.  EMR reviewed including pt PMHx, past surgical history and past visits to ER.   See HPI for more details   Lab Tests:   I ordered and independently interpreted labs. Labs notable for   Imaging Studies:  Abnormal findings. I personally  reviewed all imaging studies. Imaging notable for  CT chest abdomen pelvis dissection study was obtained in triage.  I personally viewed these images agree with radiology read.  IMPRESSION: 1. No aortic dissection or aneurysm. 2. Small bilateral pleural effusions. 3. Cholelithiasis with mild pericholecystic haziness. Ultrasound is recommended for better evaluation of the gallbladder. 4. Sigmoid diverticulosis without active inflammatory changes. No bowel obstruction. 5. Aortic Atherosclerosis (ICD10-I70.0). Electronically Signed   By: Anner Crete M.D.   On: 09/19/2021 01:09    Quadrant ultrasound without any evidence of cholecystitis and patient is no longer experiencing any symptoms at all.  Chest x-ray also reviewed.  Cardiac Monitoring:  The patient was maintained on a cardiac monitor.  I personally viewed and interpreted the cardiac monitored which showed an underlying rhythm of: NSR EKG non-ischemic   Medicines ordered:  I ordered medication including home BP meds for elevated BP.  Reevaluation of the patient after these medicines showed that the patient stayed the same no symptoms. BP improved.  I have reviewed the patients home medicines and have made adjustments as needed   Critical Interventions:     Consults/Attending Physician   I discussed this case with my attending physician who cosigned this note including patient's presenting symptoms, physical exam, and planned diagnostics and interventions. Attending physician stated agreement with plan or made changes to plan which were implemented.   Reevaluation:  After the interventions noted above I re-evaluated patient and found that they have :stayed the same   Social Determinants of Health:      Problem List / ED Course:  CP now resolved.  Abn GB - has cholelithiasis without evidence or symptoms of cholecystitis.  Elevated BP - improved with home meds plus small lopressor dose. Still hypertensive but  reasonable to DC home w home meds to take.    Dispostion:  After consideration of the diagnostic results and the patients response to treatment, I feel that the patent would benefit from outpatient follow-up.  Return precautions were discussed.   Final Clinical Impression(s) / ED Diagnoses Final diagnoses:  Chest pain, unspecified type  Calculus of gallbladder without cholecystitis without obstruction    Rx / DC Orders ED Discharge Orders     None         Tedd Sias, [place] 09/19/21 [place], DO 09/21/21 [phone]

## 2021-09-19 NOTE — ED Notes (Signed)
Patient states her chest pain is back. Triage RN notified and vitals updated. New EKG done as well.

## 2021-09-19 NOTE — ED Notes (Signed)
Patient ambulated to the bathroom with no assistance. 

## 2021-09-22 ENCOUNTER — Telehealth: Payer: Self-pay

## 2021-09-22 DIAGNOSIS — R0789 Other chest pain: Secondary | ICD-10-CM | POA: Diagnosis not present

## 2021-09-22 DIAGNOSIS — K449 Diaphragmatic hernia without obstruction or gangrene: Secondary | ICD-10-CM | POA: Diagnosis not present

## 2021-09-22 DIAGNOSIS — K8050 Calculus of bile duct without cholangitis or cholecystitis without obstruction: Secondary | ICD-10-CM | POA: Diagnosis not present

## 2021-09-22 DIAGNOSIS — J9811 Atelectasis: Secondary | ICD-10-CM | POA: Diagnosis not present

## 2021-09-22 DIAGNOSIS — R079 Chest pain, unspecified: Secondary | ICD-10-CM | POA: Diagnosis not present

## 2021-09-22 DIAGNOSIS — I10 Essential (primary) hypertension: Secondary | ICD-10-CM | POA: Diagnosis not present

## 2021-09-22 DIAGNOSIS — J90 Pleural effusion, not elsewhere classified: Secondary | ICD-10-CM | POA: Diagnosis not present

## 2021-09-22 NOTE — Telephone Encounter (Signed)
Transition Care Management Follow-up Telephone Call Date of discharge and from where: [name] 09/19/21 How have you been since you were released from the hospital? [name] Any questions or concerns? No  Items Reviewed: Did the pt receive and understand the discharge instructions provided? Yes  Medications obtained and verified? Yes  Other? No  Any new allergies since your discharge? No  Dietary orders reviewed? Yes Do you have support at home? Yes   Functional Questionnaire: (I = Independent and D = Dependent) ADLs: I  Bathing/Dressing- I  Meal Prep- I  Eating- I  Maintaining continence- I  Transferring/Ambulation- I  Managing Meds- I  Follow up appointments reviewed:  PCP Hospital f/u appt confirmed? Yes  Scheduled to see [name] on 09/27/21 @ 11:15.  Are transportation arrangements needed? No  If their condition worsens, is the pt aware to call PCP or go to the Emergency Dept.? Yes Was the patient provided with contact information for the PCP's office or ED? Yes Was to pt encouraged to call back with questions or concerns? Yes

## 2021-09-22 NOTE — Telephone Encounter (Signed)
Transition Care Management Unsuccessful Follow-up Telephone Call  Date of discharge and from where:  [name] 09/19/21  Attempts:  1st Attempt  Reason for unsuccessful TCM follow-up call:  Left voice message

## 2021-09-23 ENCOUNTER — Encounter: Payer: Self-pay | Admitting: Nurse Practitioner

## 2021-09-23 ENCOUNTER — Ambulatory Visit: Payer: Medicare HMO | Admitting: Nurse Practitioner

## 2021-09-23 VITALS — BP 140/70 | HR 74 | Ht 66.0 in | Wt 199.6 lb

## 2021-09-23 DIAGNOSIS — R011 Cardiac murmur, unspecified: Secondary | ICD-10-CM

## 2021-09-23 DIAGNOSIS — I5189 Other ill-defined heart diseases: Secondary | ICD-10-CM

## 2021-09-23 DIAGNOSIS — R600 Localized edema: Secondary | ICD-10-CM

## 2021-09-23 DIAGNOSIS — E782 Mixed hyperlipidemia: Secondary | ICD-10-CM

## 2021-09-23 DIAGNOSIS — E1165 Type 2 diabetes mellitus with hyperglycemia: Secondary | ICD-10-CM

## 2021-09-23 DIAGNOSIS — R079 Chest pain, unspecified: Secondary | ICD-10-CM | POA: Diagnosis not present

## 2021-09-23 DIAGNOSIS — I10 Essential (primary) hypertension: Secondary | ICD-10-CM

## 2021-09-23 MED ORDER — POTASSIUM CHLORIDE CRYS ER 20 MEQ PO TBCR: 20.0000 meq | EXTENDED_RELEASE_TABLET | Freq: Every day | ORAL | 3 refills | Status: DC

## 2021-09-23 MED ORDER — FUROSEMIDE 40 MG PO TABS: 40.0000 mg | ORAL_TABLET | Freq: Every day | ORAL | 3 refills | Status: DC

## 2021-09-23 NOTE — Patient Instructions (Addendum)
Medication Instructions:  START Lasix 40 mg daily START Potassium 20 meq daily  *If you need a refill on your cardiac medications before your next appointment, please call your pharmacy*  Lab Work: NONE ordered at this time of appointment   If you have labs (blood work) drawn today and your tests are completely normal, you will receive your results only by: [facility] (if you have MyChart) OR A paper copy in the mail If you have any lab test that is abnormal or we need to change your treatment, we will call you to review the results.  Testing/Procedures: Your physician has requested that you have an echocardiogram. Echocardiography is a painless test that uses sound waves to create images of your heart. It provides your doctor with information about the size and shape of your heart and how well your heart's chambers and valves are working. This procedure takes approximately one hour. There are no restrictions for this procedure.  Follow-Up: At [facility], you and your health needs are our priority.  As part of our continuing mission to provide you with exceptional heart care, we have created designated Provider Care Teams.  These Care Teams include your primary Cardiologist (physician) and Advanced Practice Providers (APPs -  Physician Assistants and Nurse Practitioners) who all work together to provide you with the care you need, when you need it.  Your next appointment:   2 week(s)  The format for your next appointment:   In Person  Provider:   Diona Browner, NP       Other Instructions MONITOR blood pressure at home once a day. Take your blood pressure 1-2 hours after taking morning medications. Bring log to follow up appointment.   Important Information About Sugar

## 2021-09-23 NOTE — Progress Notes (Signed)
Office Visit    Patient Name: [name] Date of Encounter: 09/23/2021  Primary Care Provider:  [name] Primary Cardiologist:  Quay Burow, MD  Chief Complaint    76 year old female with a history of cardiac murmur, chest pain, hypertension, hyperlipidemia, type 2 diabetes, GERD, breast cancer, and osteoporosis who presents for post ED follow-up related to chest pain.  Past Medical History    Past Medical History:  Diagnosis Date   Abnormal bone density screening    Arthritis    Cancer ([place]) 07/2017   left breast cancer   Complication of anesthesia    "i wake up"   Diverticulosis    DM (diabetes mellitus) ([place])    Family history of colon cancer    Family history of ovarian cancer    Family history of pancreatic cancer    Family history of prostate cancer    Family history of stomach cancer    GERD 08/11/2008   Qualifier: Diagnosis of  By: Sarajane Jews MD, Ishmael Holter    GERD (gastroesophageal reflux disease)    History of radiation therapy 07/10/17- 08/06/17   40.05 Gy directed to the left breast in 15 fractions, followed by a boost of 10 Gy in 5 fractions.    HTN (hypertension)    Hyperlipidemia    Hypertriglyceridemia 04/13/2019   Osteoporosis 06/15/2017   Personal history of radiation therapy    Poor compliance    Uncontrolled hypertension 03/20/2019   Vitamin D deficiency    Past Surgical History:  Procedure Laterality Date   APPENDECTOMY     BREAST BIOPSY Left 04/24/2017   malignant   BREAST BIOPSY Left 04/24/2017   benign   BREAST BIOPSY Left 04/25/2017   benign   BREAST BIOPSY Left 04/2018   fibroadenoma w/ calcs   BREAST LUMPECTOMY Left 05/30/2017   BREAST LUMPECTOMY WITH RADIOACTIVE SEED LOCALIZATION Left 05/30/2017   Procedure: LEFT BREAST LUMPECTOMY WITH RADIOACTIVE SEED LOCALIZATION;  Surgeon: Erroll Luna, MD;  Location: [facility];  Service: General;  Laterality: Left;   COLONOSCOPY  06/12/2005   Schooler/normal exam="i woke up during it"    DILATION AND CURETTAGE OF UTERUS     FOREIGN BODY REMOVAL ESOPHAGEAL     TONSILLECTOMY     TUBAL LIGATION      Allergies  Allergies  Allergen Reactions   Atorvastatin Other (See Comments)    dizziness   Fosamax [Alendronate Sodium]     Cough,trouble shallowing, chest pain   Lisinopril Cough   Tetracycline Nausea And Vomiting    History of Present Illness    76 year old female with the above past medical history including cardiac murmur, chest pain, hypertension, hyperlipidemia, type 2 diabetes, GERD, breast cancer, and osteoporosis.  She was referred to Dr. Alvester Chou in December 2020 for murmur.  BP was elevated at the time.  Echocardiogram in January 2021 showed EF 60 to 65%, moderate LVH, G2 DD, severe mitral annular calcification, no evidence of mitral valve regurgitation.  She was last seen virtually on 04/21/2020 and was stable from a cardiac standpoint.  He did report some dependent bilateral lower extremity edema.  BP was somewhat elevated.  Follow-up was recommended in 1 year.  She presented to the ED on 09/19/2021 with complaints of chest pain, shortness of breath, nausea, and tingling to her right arm.  Her symptoms lasted for approximately 2 hours.  Her CP resolved by the time she arrived to the ED.  BP was elevated.  BNP was 115.4.  Troponin was mildly elevated with flat trend.  EKG was unremarkable.  Chest x-ray showed cardiomegaly with vascular congestion, small bilateral effusions with bibasilar atelectasis. CT angio of the chest/abdomen/pelvis showed small pericardial effusion measuring 5 mm in thickness, moderate atherosclerotic calcification of thoracic aorta, no PE, no dissection, small bilateral pleural effusions, multiple gallstones. RUQ Ultrasound showed cholelithiasis without evidence of acute cholecystitis.  She was discharged home in stable condition and advised to follow-up with cardiology as an outpatient.  She presents today for follow-up accompanied by her  significant other.  She states she returned to the ED on 09/22/2021 with recurrent gallbladder attack.  Records not available as this was at [facility].  She states was referred to general surgery/GI for consideration of gallbladder removal. She states that her chest discomfort has only occurred in the setting of these gallbladder attacks. She denies any other chest pain, dyspnea, palpitations.  She does note recent weight gain, bilateral lower extremity edema.  BP has been somewhat elevated at home.  She denies PND, orthopnea.  She is eager to have her gallbladder fixed.  Other than her recent episodes of abdominal/chest pain, lower extremity edema, she denies any additional concerns today.  Home Medications    Current Outpatient Medications  Medication Sig Dispense Refill   Accu-Chek Softclix Lancets lancets 1 each by Other route 2 (two) times daily. Used to check blood sugars twice daily. DX code E11.9.     amLODipine (NORVASC) 10 MG tablet TAKE 1 TABLET BY MOUTH EVERY DAY 15 tablet 0   aspirin EC 81 MG tablet Take 81 mg by mouth daily.     Blood Glucose Monitoring Suppl (ACCU-CHEK AVIVA PLUS) w/Device KIT USE AS DIRECTED 2 TIMES DAILY 1 kit 0   calcium-vitamin D (OSCAL WITH D) 250-125 MG-UNIT tablet Take 1 tablet by mouth daily.     carvedilol (COREG) 25 MG tablet TAKE 1 TABLET BY MOUTH TWICE A DAY 30 tablet 0   Cholecalciferol (VITAMIN D3) 1.25 MG (50000 UT) CAPS Take 50,000 Units by mouth daily.     Cinnamon 500 MG capsule Take 500 mg by mouth daily.     glucose blood test strip Used to check blood sugars twice daily. DX code E11.9. 100 each 12   losartan-hydrochlorothiazide (HYZAAR) 100-12.5 MG tablet Take 1 tablet by mouth daily. 90 tablet 2   metFORMIN (GLUCOPHAGE-XR) 500 MG 24 hr tablet Take 4 tablets (2,000 mg total) by mouth daily with breakfast. 360 tablet 3   pravastatin (PRAVACHOL) 20 MG tablet Take 1 tablet (20 mg total) by mouth daily. 90 tablet 2   PROLIA 60 MG/ML SOLN  injection   0   tamoxifen (NOLVADEX) 20 MG tablet Take 1 tablet (20 mg total) by mouth daily. 90 tablet 3   vitamin C (ASCORBIC ACID) 500 MG tablet Take 500 mg by mouth daily.     furosemide (LASIX) 40 MG tablet Take 1 tablet (40 mg total) by mouth daily. 90 tablet 3   potassium chloride SA (KLOR-CON M20) 20 MEQ tablet Take 1 tablet (20 mEq total) by mouth daily. 90 tablet 3   No current facility-administered medications for this visit.     Review of Systems    She denies chest pain, palpitations, dyspnea, pnd, orthopnea, n, v, dizziness, syncope, or early satiety. All other systems reviewed and are otherwise negative except as noted above.   Physical Exam    VS:  BP 140/70   Pulse 74   Ht '5\' 6"'  (1.676 m)  Wt 199 lb 9.6 oz (90.5 kg)   SpO2 96%   BMI 32.22 kg/m   GEN: Well nourished, well developed, in no acute distress. HEENT: normal. Neck: Supple, no JVD, carotid bruits, or masses. Cardiac: RRR, no murmurs, rubs, or gallops. No clubbing, cyanosis, nonpitting bilateral lower extremity edema.  Radials/DP/PT 2+ and equal bilaterally.  Respiratory:  Respirations regular and unlabored, clear to auscultation bilaterally. GI: Soft, nontender, nondistended, BS + x 4. MS: no deformity or atrophy. Skin: warm and dry, no rash. Neuro:  Strength and sensation are intact. Psych: Normal affect.  Accessory Clinical Findings    ECG personally reviewed by me today -EKG on 09/20/2021 in ED reviewed, sinus tachycardia, 104 bpm- no acute changes.  No EKG in office today.  Lab Results  Component Value Date   WBC 12.2 (H) 09/18/2021   HGB 12.4 09/18/2021   HCT 37.9 09/18/2021   MCV 89.2 09/18/2021   PLT 315 09/18/2021   Lab Results  Component Value Date   CREATININE 0.89 09/18/2021   BUN 10 09/18/2021   NA 136 09/18/2021   K 3.5 09/18/2021   CL 104 09/18/2021   CO2 21 (L) 09/18/2021   Lab Results  Component Value Date   ALT 21 09/19/2021   AST 27 09/19/2021   ALKPHOS 51 09/19/2021    BILITOT 0.6 09/19/2021   Lab Results  Component Value Date   CHOL 124 08/29/2021   HDL 40 08/29/2021   LDLCALC 54 08/29/2021   LDLDIRECT 172.1 10/16/2006   TRIG 180 (H) 08/29/2021   CHOLHDL 3.1 08/29/2021    Lab Results  Component Value Date   HGBA1C 8.6 (H) 08/29/2021    Assessment & Plan    1. Chest pain/shortness of breath/bilateral lower extremity edema/diastolic dysfunction: Symptoms have occurred only in the setting of what have now been identified as acute cholelithiasis.  However, she does note recent bilateral lower extremity edema, weight gain. Echo in January 2021 showed EF 60 to 65%, moderate LVH, G2 DD, severe mitral annular calcification, no evidence of mitral valve regurgitation.  Will repeat echocardiogram.  Start Lasix 40 mg daily, potassium 20 mEq daily.  Plan for follow-up in 2 weeks with repeat BMET at that time.  If her chest pain persists despite treatment for cholelithiasis, consider further ischemic evaluation with possible coronary CT angiogram.   2. Cardiac murmur: Most recent echo as above. No significant murmur appreciated on exam today.  Repeat echo pending.    3. Hypertension: BP elevated above goal in office today, recheck BP 140/70.  She states this is typical for her at home.  We will continue to monitor BP with addition of Lasix as above.  Consider further titration of antihypertensive regimen if BP remains elevated above 130/80.  For now, continue carvedilol, losartan-HCTZ, and amlodipine.  4. Hyperlipidemia: LDL was 54 in May 2023.  Continue aspirin, pravastatin.  5. Type 2 diabetes: A1c was 8.6 in May 2023.  Monitored and managed per PCP.  6. Preoperative cardiac exam: Patient is pending potential gallbladder surgery. According to the Revised Cardiac Risk Index (RCRI), her Perioperative Risk of Major Cardiac Event is (%): 0.4. Her Functional Capacity in METs is: 6.45 according to the Duke Activity Status Index (DASI).  She does have signs of  possible fluid volume overload, repeat echocardiogram pending, however, this should not preclude surgery as she is able to complete greater than 4 METS without any symptoms.  Therefore, based on ACC/AHA guidelines, patient would be at acceptable risk for  the planned procedure without further cardiovascular testing.   7. Disposition: Follow-up in 2 weeks.  Lenna Sciara, NP 09/23/2021, 5:40 PM

## 2021-09-26 NOTE — Progress Notes (Unsigned)
Established Patient Office Visit  Subjective:  Patient ID: [name], female    DOB: 11-03-1945  Age: 76 y.o. MRN: [id number]  CC: No chief complaint on file.   HPI [name] presents for ***  Outpatient Medications Prior to Visit  Medication Sig Dispense Refill   Accu-Chek Softclix Lancets lancets 1 each by Other route 2 (two) times daily. Used to check blood sugars twice daily. DX code E11.9.     amLODipine (NORVASC) 10 MG tablet TAKE 1 TABLET BY MOUTH EVERY DAY 15 tablet 0   aspirin EC 81 MG tablet Take 81 mg by mouth daily.     Blood Glucose Monitoring Suppl (ACCU-CHEK AVIVA PLUS) w/Device KIT USE AS DIRECTED 2 TIMES DAILY 1 kit 0   calcium-vitamin D (OSCAL WITH D) 250-125 MG-UNIT tablet Take 1 tablet by mouth daily.     carvedilol (COREG) 25 MG tablet TAKE 1 TABLET BY MOUTH TWICE A DAY 30 tablet 0   Cholecalciferol (VITAMIN D3) 1.25 MG (50000 UT) CAPS Take 50,000 Units by mouth daily.     Cinnamon 500 MG capsule Take 500 mg by mouth daily.     furosemide (LASIX) 40 MG tablet Take 1 tablet (40 mg total) by mouth daily. 90 tablet 3   glucose blood test strip Used to check blood sugars twice daily. DX code E11.9. 100 each 12   losartan-hydrochlorothiazide (HYZAAR) 100-12.5 MG tablet Take 1 tablet by mouth daily. 90 tablet 2   metFORMIN (GLUCOPHAGE-XR) 500 MG 24 hr tablet Take 4 tablets (2,000 mg total) by mouth daily with breakfast. 360 tablet 3   potassium chloride SA (KLOR-CON M20) 20 MEQ tablet Take 1 tablet (20 mEq total) by mouth daily. 90 tablet 3   pravastatin (PRAVACHOL) 20 MG tablet Take 1 tablet (20 mg total) by mouth daily. 90 tablet 2   PROLIA 60 MG/ML SOLN injection   0   tamoxifen (NOLVADEX) 20 MG tablet Take 1 tablet (20 mg total) by mouth daily. 90 tablet 3   vitamin C (ASCORBIC ACID) 500 MG tablet Take 500 mg by mouth daily.     No facility-administered medications prior to visit.    Allergies  Allergen Reactions   Atorvastatin Other (See  Comments)    dizziness   Fosamax [Alendronate Sodium]     Cough,trouble shallowing, chest pain   Lisinopril Cough   Tetracycline Nausea And Vomiting    Patient Care Team: [name] as PCP - General (Physician Assistant) Lorretta Harp, MD as PCP - Cardiology (Cardiology) Nicholas Lose, MD as Consulting Physician (Hematology and Oncology) Eppie Gibson, MD as Attending Physician (Radiation Oncology) Erroll Luna, MD as Consulting Physician (General Surgery) Delice Bison, Charlestine Massed, NP as Nurse Practitioner (Hematology and Oncology) Renato Shin, MD (Inactive) as Consulting Physician (Endocrinology)  ROS Review of Systems    Objective:    Physical Exam  There were no vitals taken for this visit.  Wt Readings from Last 3 Encounters:  09/23/21 199 lb 9.6 oz (90.5 kg)  08/29/21 195 lb 9.6 oz (88.7 kg)  06/10/21 195 lb (88.5 kg)    Results for orders placed or performed during the hospital encounter of 88/41/66  Basic metabolic panel  Result Value Ref Range   Sodium 136 135 - 145 mmol/L   Potassium 3.5 3.5 - 5.1 mmol/L   Chloride 104 98 - 111 mmol/L   CO2 21 (L) 22 - 32 mmol/L   Glucose, Bld 248 (H) 70 - 99 mg/dL  BUN 10 8 - 23 mg/dL   Creatinine, Ser 0.89 0.44 - 1.00 mg/dL   Calcium 8.8 (L) 8.9 - 10.3 mg/dL   GFR, Estimated >60 >60 mL/min   Anion gap 11 5 - 15  CBC  Result Value Ref Range   WBC 12.2 (H) 4.0 - 10.5 K/uL   RBC 4.25 3.87 - 5.11 MIL/uL   Hemoglobin 12.4 12.0 - 15.0 g/dL   HCT 37.9 36.0 - 46.0 %   MCV 89.2 80.0 - 100.0 fL   MCH 29.2 26.0 - 34.0 pg   MCHC 32.7 30.0 - 36.0 g/dL   RDW 13.9 11.5 - 15.5 %   Platelets 315 150 - 400 K/uL   nRBC 0.0 0.0 - 0.2 %  Hepatic function panel  Result Value Ref Range   Total Protein 6.4 (L) 6.5 - 8.1 g/dL   Albumin 3.4 (L) 3.5 - 5.0 g/dL   AST 27 15 - 41 U/L   ALT 21 0 - 44 U/L   Alkaline Phosphatase 51 38 - 126 U/L   Total Bilirubin 0.6 0.3 - 1.2 mg/dL   Bilirubin, Direct 0.2 0.0 - 0.2  mg/dL   Indirect Bilirubin 0.4 0.3 - 0.9 mg/dL  Lipase, blood  Result Value Ref Range   Lipase 22 11 - 51 U/L  Brain natriuretic peptide  Result Value Ref Range   B Natriuretic Peptide 115.4 (H) 0.0 - 100.0 pg/mL  Troponin I (High Sensitivity)  Result Value Ref Range   Troponin I (High Sensitivity) 15 <18 ng/L  Troponin I (High Sensitivity)  Result Value Ref Range   Troponin I (High Sensitivity) 21 (H) <18 ng/L  Troponin I (High Sensitivity)  Result Value Ref Range   Troponin I (High Sensitivity) 18 (H) <18 ng/L     {Labs (Optional):23779}  The ASCVD Risk score (Arnett DK, et al., 2019) failed to calculate for the following reasons:   The valid total cholesterol range is 130 to 320 mg/dL    Assessment & Plan:   Problem List Items Addressed This Visit   None   No orders of the defined types were placed in this encounter.   Follow-up: No follow-ups on file.    Irene Pap, PA-C

## 2021-09-27 ENCOUNTER — Encounter: Payer: Self-pay | Admitting: Physician Assistant

## 2021-09-27 ENCOUNTER — Ambulatory Visit (INDEPENDENT_AMBULATORY_CARE_PROVIDER_SITE_OTHER): Payer: Medicare HMO | Admitting: Physician Assistant

## 2021-09-27 VITALS — BP 140/80 | HR 72 | Ht 66.0 in | Wt 191.6 lb

## 2021-09-27 DIAGNOSIS — I10 Essential (primary) hypertension: Secondary | ICD-10-CM | POA: Diagnosis not present

## 2021-09-27 DIAGNOSIS — K8050 Calculus of bile duct without cholangitis or cholecystitis without obstruction: Secondary | ICD-10-CM | POA: Diagnosis not present

## 2021-09-27 NOTE — Assessment & Plan Note (Signed)
Improved control, continue current management and low salt diet

## 2021-09-29 ENCOUNTER — Encounter: Payer: Self-pay | Admitting: Physician Assistant

## 2021-10-03 DIAGNOSIS — K8010 Calculus of gallbladder with chronic cholecystitis without obstruction: Secondary | ICD-10-CM | POA: Diagnosis not present

## 2021-10-05 ENCOUNTER — Ambulatory Visit (INDEPENDENT_AMBULATORY_CARE_PROVIDER_SITE_OTHER): Payer: Medicare HMO

## 2021-10-05 DIAGNOSIS — I5189 Other ill-defined heart diseases: Secondary | ICD-10-CM | POA: Diagnosis not present

## 2021-10-05 DIAGNOSIS — R600 Localized edema: Secondary | ICD-10-CM | POA: Diagnosis not present

## 2021-10-05 LAB — ECHOCARDIOGRAM COMPLETE
Area-P 1/2: 3.37 cm2
S' Lateral: 2.48 cm

## 2021-10-07 ENCOUNTER — Encounter: Payer: Self-pay | Admitting: Internal Medicine

## 2021-10-07 ENCOUNTER — Encounter: Payer: Self-pay | Admitting: Nurse Practitioner

## 2021-10-07 ENCOUNTER — Ambulatory Visit: Payer: Medicare HMO | Admitting: Nurse Practitioner

## 2021-10-07 VITALS — BP 116/64 | HR 79 | Ht 66.0 in | Wt 183.0 lb

## 2021-10-07 DIAGNOSIS — Z01810 Encounter for preprocedural cardiovascular examination: Secondary | ICD-10-CM

## 2021-10-07 DIAGNOSIS — R600 Localized edema: Secondary | ICD-10-CM

## 2021-10-07 DIAGNOSIS — R079 Chest pain, unspecified: Secondary | ICD-10-CM | POA: Diagnosis not present

## 2021-10-07 DIAGNOSIS — I5189 Other ill-defined heart diseases: Secondary | ICD-10-CM | POA: Diagnosis not present

## 2021-10-07 DIAGNOSIS — E782 Mixed hyperlipidemia: Secondary | ICD-10-CM | POA: Diagnosis not present

## 2021-10-07 DIAGNOSIS — R011 Cardiac murmur, unspecified: Secondary | ICD-10-CM

## 2021-10-07 DIAGNOSIS — E1165 Type 2 diabetes mellitus with hyperglycemia: Secondary | ICD-10-CM

## 2021-10-07 DIAGNOSIS — I10 Essential (primary) hypertension: Secondary | ICD-10-CM | POA: Diagnosis not present

## 2021-10-07 DIAGNOSIS — I5032 Chronic diastolic (congestive) heart failure: Secondary | ICD-10-CM | POA: Diagnosis not present

## 2021-10-07 NOTE — Progress Notes (Signed)
Office Visit    Patient Name: [name] Date of Encounter: 10/07/2021  Primary Care Provider:  [name] Primary Cardiologist:  Quay Burow, MD  Chief Complaint    76 year old female with a history of cardiac murmur, chronic diastolic heart failure, mitral annular calcification, hypertension, hyperlipidemia, type 2 diabetes, GERD, breast cancer, and osteoporosis who presents for follow-up related to chest pain, heart failure, and hypertension.  Past Medical History    Past Medical History:  Diagnosis Date   Abnormal bone density screening    Arthritis    Cancer ([place]) 07/2017   left breast cancer   Complication of anesthesia    "i wake up"   Diverticulosis    DM (diabetes mellitus) ([place])    Family history of colon cancer    Family history of ovarian cancer    Family history of pancreatic cancer    Family history of prostate cancer    Family history of stomach cancer    GERD 08/11/2008   Qualifier: Diagnosis of  By: Sarajane Jews MD, Ishmael Holter    GERD (gastroesophageal reflux disease)    History of radiation therapy 07/10/17- 08/06/17   40.05 Gy directed to the left breast in 15 fractions, followed by a boost of 10 Gy in 5 fractions.    HTN (hypertension)    Hyperlipidemia    Hypertriglyceridemia 04/13/2019   Osteoporosis 06/15/2017   Personal history of radiation therapy    Poor compliance    Uncontrolled hypertension 03/20/2019   Vitamin D deficiency    Past Surgical History:  Procedure Laterality Date   APPENDECTOMY     BREAST BIOPSY Left 04/24/2017   malignant   BREAST BIOPSY Left 04/24/2017   benign   BREAST BIOPSY Left 04/25/2017   benign   BREAST BIOPSY Left 04/2018   fibroadenoma w/ calcs   BREAST LUMPECTOMY Left 05/30/2017   BREAST LUMPECTOMY WITH RADIOACTIVE SEED LOCALIZATION Left 05/30/2017   Procedure: LEFT BREAST LUMPECTOMY WITH RADIOACTIVE SEED LOCALIZATION;  Surgeon: Erroll Luna, MD;  Location: [facility];  Service: General;  Laterality:  Left;   COLONOSCOPY  06/12/2005   Schooler/normal exam="i woke up during it"   DILATION AND CURETTAGE OF UTERUS     FOREIGN BODY REMOVAL ESOPHAGEAL     TONSILLECTOMY     TUBAL LIGATION      Allergies  Allergies  Allergen Reactions   Atorvastatin Other (See Comments)    dizziness   Fosamax [Alendronate Sodium]     Cough,trouble shallowing, chest pain   Lisinopril Cough   Tetracycline Nausea And Vomiting    History of Present Illness    76 year old female with the above past medical history including cardiac murmur, chronic diastolic heart failure, mitral annular calcification, hypertension, hyperlipidemia, type 2 diabetes, GERD, breast cancer, and osteoporosis.   She was referred to Dr. Alvester Chou in December 2020 for murmur.  BP was elevated at the time.  Echocardiogram in January 2021 showed EF 60 to 65%, moderate LVH, G2 DD, severe mitral annular calcification, no evidence of mitral valve regurgitation.  She was last seen virtually on 04/21/2020 and was stable from a cardiac standpoint.  He did report some dependent bilateral lower extremity edema.  BP was somewhat elevated.  Follow-up was recommended in 1 year.   She presented to the ED on 09/19/2021 with complaints of chest pain, shortness of breath, nausea, and tingling to her right arm.  Her symptoms lasted for approximately 2 hours.  Her CP resolved by the time  she arrived to the ED.  BP was elevated.  BNP was 115.4.  Troponin was mildly elevated with flat trend.  EKG was unremarkable.  Chest x-ray showed cardiomegaly with vascular congestion, small bilateral effusions with bibasilar atelectasis. CT angio of the chest/abdomen/pelvis showed small pericardial effusion measuring 5 mm in thickness, moderate atherosclerotic calcification of thoracic aorta, no PE, no dissection, small bilateral pleural effusions, multiple gallstones. RUQ ultrasound showed cholelithiasis without evidence of acute cholecystitis.  She was discharged home in stable  condition and advised to follow-up with cardiology as an outpatient. She returned to the ED at [facility] on 09/22/2021 with recurrent gallbladder attack. She was referred to general surgery/GI for consideration of gallbladder removal.  She was last seen in the office on 09/23/2021 and was stable from a cardiac standpoint.  She noted that her chest discomfort occurred only in the setting of gallbladder attacks. BP was somewhat elevated.  She also noted bilateral lower extremity edema, weight gain.  She was started on Lasix 40 mg daily. She was cleared for cardiac gallbladder surgery.  Repeat echocardiogram showed EF 60 to 65%, normal LV function, moderate concentric LVH, G1 DD, moderate mitral annular calcification, trivial circumferential pericardial effusion, no evidence of cardiac tamponade.  She presents today for follow-up.  Since her last visit she has done well from a cardiac standpoint.  Her edema has decreased significantly, weight is down 8 pounds.  She denies any dyspnea, PND, orthopnea, denies symptoms concerning for angina.  Her gallbladder attacks have become less frequent.  She is scheduled for gallbladder surgery on 10/19/2021.  BP has been well controlled.  Overall, she reports feeling well denies any new concerns today.  Home Medications    Current Outpatient Medications  Medication Sig Dispense Refill   Accu-Chek Softclix Lancets lancets 1 each by Other route 2 (two) times daily. Used to check blood sugars twice daily. DX code E11.9.     amLODipine (NORVASC) 10 MG tablet TAKE 1 TABLET BY MOUTH EVERY DAY 15 tablet 0   aspirin EC 81 MG tablet Take 81 mg by mouth daily.     Blood Glucose Monitoring Suppl (ACCU-CHEK AVIVA PLUS) w/Device KIT USE AS DIRECTED 2 TIMES DAILY 1 kit 0   calcium-vitamin D (OSCAL WITH D) 250-125 MG-UNIT tablet Take 1 tablet by mouth daily.     carvedilol (COREG) 25 MG tablet TAKE 1 TABLET BY MOUTH TWICE A DAY 30 tablet 0   Cholecalciferol (VITAMIN D3) 1.25 MG (50000  UT) CAPS Take 50,000 Units by mouth daily.     Cinnamon 500 MG capsule Take 500 mg by mouth daily.     furosemide (LASIX) 40 MG tablet Take 1 tablet (40 mg total) by mouth daily. 90 tablet 3   glucose blood test strip Used to check blood sugars twice daily. DX code E11.9. 100 each 12   hyoscyamine (LEVSIN) 0.125 MG tablet Take by mouth 3 (three) times daily as needed.     losartan-hydrochlorothiazide (HYZAAR) 100-12.5 MG tablet Take 1 tablet by mouth daily. 90 tablet 2   metFORMIN (GLUCOPHAGE-XR) 500 MG 24 hr tablet Take 4 tablets (2,000 mg total) by mouth daily with breakfast. 360 tablet 3   ondansetron (ZOFRAN-ODT) 4 MG disintegrating tablet Take 4 mg by mouth every 6 (six) hours as needed.     potassium chloride SA (KLOR-CON M20) 20 MEQ tablet Take 1 tablet (20 mEq total) by mouth daily. 90 tablet 3   pravastatin (PRAVACHOL) 20 MG tablet Take 1 tablet (20 mg total)  by mouth daily. 90 tablet 2   PROLIA 60 MG/ML SOLN injection   0   tamoxifen (NOLVADEX) 20 MG tablet Take 1 tablet (20 mg total) by mouth daily. 90 tablet 3   vitamin C (ASCORBIC ACID) 500 MG tablet Take 500 mg by mouth daily.     No current facility-administered medications for this visit.     Review of Systems    She denies chest pain, palpitations, dyspnea, pnd, orthopnea, n, v, dizziness, syncope, edema, weight gain, or early satiety. All other systems reviewed and are otherwise negative except as noted above.   Physical Exam    VS:  BP 116/64   Pulse 79   Ht _0  (1.676 m)   Wt 183 lb (83 kg)   SpO2 96%   BMI 29.54 kg/m   GEN: Well nourished, well developed, in no acute distress. HEENT: normal. Neck: Supple, no JVD, carotid bruits, or masses. Cardiac: RRR, no murmurs, rubs, or gallops. No clubbing, cyanosis, nonpitting bilateral ankle edema.  Radials/DP/PT 2+ and equal bilaterally.  Respiratory:  Respirations regular and unlabored, clear to auscultation bilaterally. GI: Soft, nontender, nondistended, BS + x  4. MS: no deformity or atrophy. Skin: warm and dry, no rash. Neuro:  Strength and sensation are intact. Psych: Normal affect.  Accessory Clinical Findings    ECG personally reviewed by me today - No EKG in office today.   Lab Results  Component Value Date   WBC 12.2 (H) 09/18/2021   HGB 12.4 09/18/2021   HCT 37.9 09/18/2021   MCV 89.2 09/18/2021   PLT 315 09/18/2021   Lab Results  Component Value Date   CREATININE 0.89 09/18/2021   BUN 10 09/18/2021   NA 136 09/18/2021   K 3.5 09/18/2021   CL 104 09/18/2021   CO2 21 (L) 09/18/2021   Lab Results  Component Value Date   ALT 21 09/19/2021   AST 27 09/19/2021   ALKPHOS 51 09/19/2021   BILITOT 0.6 09/19/2021   Lab Results  Component Value Date   CHOL 124 08/29/2021   HDL 40 08/29/2021   LDLCALC 54 08/29/2021   LDLDIRECT 172.1 10/16/2006   TRIG 180 (H) 08/29/2021   CHOLHDL 3.1 08/29/2021    Lab Results  Component Value Date   HGBA1C 8.6 (H) 08/29/2021    Assessment & Plan    1. Chest pain/chronic diastolic heart failure: Symptoms of chest pain occurred only in the setting of what has now been identified as acute cholelithiasis. Recent bilateral lower extremity edema, weight gain. Repeat echocardiogram showed EF 60 to 65%, normal LV function, moderate concentric LVH, G1 DD, moderate mitral annular calcification, trivial circumferential pericardial effusion, no evidence of cardiac tamponade.  She has very mild nonpitting bilateral ankle edema, otherwise euvolemic and well compensated on exam.  She denies any symptoms concerning for angina.  Discussed daily weights, sodium recommendations.  Given new Lasix, will check BMET today.  Continue aspirin, carvedilol, losartan-HCTZ, amlodipine, Lasix, potassium, and pravastatin.  2. Cardiac murmur/mitral annular calcification: 2/6 murmur on exam today.  MAC was moderate on most recent echo.  Stable.  Continue Lasix as above.  3. Hypertension: BP well controlled. Continue current  antihypertensive regimen.    4. Hyperlipidemia: LDL was 54 in May 2023.  Continue aspirin, pravastatin.   5. Type 2 diabetes: A1c was 8.6 in May 2023.  Monitored and managed per PCP.  6. Preoperative cardiac exam: According to the Revised Cardiac Risk Index (RCRI), her Perioperative Risk of Major Cardiac  Event is (%): 0.4. Her Functional Capacity in METs is: 6.45 according to the Duke Activity Status Index (DASI). Therefore, based on ACC/AHA guidelines, patient would be at acceptable risk for the planned procedure without further cardiovascular testing.  7. Disposition: Follow-up in 2 months.   Lenna Sciara, NP 10/07/2021, 2:58 PM

## 2021-10-07 NOTE — Patient Instructions (Signed)
Medication Instructions:  Your physician recommends that you continue on your current medications as directed. Please refer to the Current Medication list given to you today.   *If you need a refill on your cardiac medications before your next appointment, please call your pharmacy*   Lab Work: Your physician recommends that you complete labs today BMET  If you have labs (blood work) drawn today and your tests are completely normal, you will receive your results only by: MyChart Message (if you have MyChart) OR A paper copy in the mail If you have any lab test that is abnormal or we need to change your treatment, we will call you to review the results.   Testing/Procedures: NONE ordered at this time of appointment     Follow-Up: At [facility], you and your health needs are our priority.  As part of our continuing mission to provide you with exceptional heart care, we have created designated Provider Care Teams.  These Care Teams include your primary Cardiologist (physician) and Advanced Practice Providers (APPs -  Physician Assistants and Nurse Practitioners) who all work together to provide you with the care you need, when you need it.  We recommend signing up for the patient portal called "MyChart".  Sign up information is provided on this After Visit Summary.  MyChart is used to connect with patients for Virtual Visits (Telemedicine).  Patients are able to view lab/test results, encounter notes, upcoming appointments, etc.  Non-urgent messages can be sent to your provider as well.   To learn more about what you can do with MyChart, go to [email or web address].    Your next appointment:   2 month(s)  The format for your next appointment:   In Person  Provider:   Almyra Deforest, PA-C        Other Instructions   Important Information About Sugar

## 2021-10-08 LAB — BASIC METABOLIC PANEL
BUN/Creatinine Ratio: 27 (ref 12–28)
BUN: 30 mg/dL — ABNORMAL HIGH (ref 8–27)
CO2: 21 mmol/L (ref 20–29)
Calcium: 9.8 mg/dL (ref 8.7–10.3)
Chloride: 100 mmol/L (ref 96–106)
Creatinine, Ser: 1.13 mg/dL — ABNORMAL HIGH (ref 0.57–1.00)
Glucose: 124 mg/dL — ABNORMAL HIGH (ref 70–99)
Potassium: 4.6 mmol/L (ref 3.5–5.2)
Sodium: 137 mmol/L (ref 134–144)
eGFR: 51 mL/min/{1.73_m2} — ABNORMAL LOW (ref >59)

## 2021-10-10 ENCOUNTER — Telehealth: Payer: Self-pay

## 2021-10-10 NOTE — Telephone Encounter (Signed)
   Pre-operative Risk Assessment    Patient Name: [name]  DOB: [birth year] MRN: [id number]      Request for Surgical Clearance    Procedure:   Lap Cholecystectomy   Date of Surgery:  Clearance 10/19/21                                 Surgeon:  Dr. Noberto Retort  Surgeon's Group or Practice Name:  [facility] Phone number:  [phone] Fax number:  [phone]   Type of Clearance Requested:   - Pharmacy:  Hold Aspirin (When can pt hold Asprin prior to surgery)   Type of Anesthesia:  General    Additional requests/questions:    Marga Melnick   07/15/9627, 3:55 PM

## 2021-10-10 NOTE — Telephone Encounter (Signed)
Pt returned call. Pt was notified of lab results, will continue current medication and follow up as planned.

## 2021-10-10 NOTE — Telephone Encounter (Signed)
Lmom to discuss lab results. Waiting on a return call.  

## 2021-10-10 NOTE — Telephone Encounter (Signed)
Lmom, waiting on a return call to discuss echo results.  

## 2021-10-10 NOTE — Telephone Encounter (Signed)
   Patient Name: [name]  DOB: [birth year] MRN: [id number]  Primary Cardiologist: Quay Burow, MD  Chart reviewed as part of pre-operative protocol coverage. Given past medical history and time since last visit, based on ACC/AHA guidelines, Rianna Lukes would be at acceptable risk for the planned procedure without further cardiovascular testing.   Patient was previously cleared for surgery at recent outpatient visit on 10/07/2021. Per office protocol, patient may hold Aspirin for 7 days prior to surgery.   I will route this recommendation to the requesting party via Epic fax function and remove from pre-op pool.  Please call with questions.  Lenna Sciara, NP 10/10/2021, 4:16 PM

## 2021-10-14 NOTE — Telephone Encounter (Signed)
Lmom to discuss echo results. Waiting on a return call. 

## 2021-10-19 DIAGNOSIS — K8020 Calculus of gallbladder without cholecystitis without obstruction: Secondary | ICD-10-CM | POA: Diagnosis not present

## 2021-10-19 DIAGNOSIS — Z79899 Other long term (current) drug therapy: Secondary | ICD-10-CM | POA: Diagnosis not present

## 2021-10-19 DIAGNOSIS — Z79810 Long term (current) use of selective estrogen receptor modulators (SERMs): Secondary | ICD-10-CM | POA: Diagnosis not present

## 2021-10-19 DIAGNOSIS — K8010 Calculus of gallbladder with chronic cholecystitis without obstruction: Secondary | ICD-10-CM | POA: Diagnosis not present

## 2021-10-19 DIAGNOSIS — K7460 Unspecified cirrhosis of liver: Secondary | ICD-10-CM | POA: Diagnosis not present

## 2021-10-19 DIAGNOSIS — Z7984 Long term (current) use of oral hypoglycemic drugs: Secondary | ICD-10-CM | POA: Diagnosis not present

## 2021-10-19 DIAGNOSIS — I10 Essential (primary) hypertension: Secondary | ICD-10-CM | POA: Diagnosis not present

## 2021-10-19 DIAGNOSIS — E119 Type 2 diabetes mellitus without complications: Secondary | ICD-10-CM | POA: Diagnosis not present

## 2021-10-19 DIAGNOSIS — Z853 Personal history of malignant neoplasm of breast: Secondary | ICD-10-CM | POA: Diagnosis not present

## 2021-10-25 ENCOUNTER — Ambulatory Visit: Payer: Medicare HMO | Admitting: Physician Assistant

## 2021-10-28 ENCOUNTER — Encounter (HOSPITAL_BASED_OUTPATIENT_CLINIC_OR_DEPARTMENT_OTHER): Payer: Self-pay

## 2021-10-28 ENCOUNTER — Ambulatory Visit (HOSPITAL_BASED_OUTPATIENT_CLINIC_OR_DEPARTMENT_OTHER)
Admission: RE | Admit: 2021-10-28 | Discharge: 2021-10-28 | Disposition: A | Discharge status: Home / Self Care | Payer: Medicare HMO | Source: Ambulatory Visit | Attending: Physician Assistant | Admitting: Physician Assistant

## 2021-10-28 DIAGNOSIS — Z1231 Encounter for screening mammogram for malignant neoplasm of breast: Secondary | ICD-10-CM | POA: Insufficient documentation

## 2021-10-28 NOTE — Progress Notes (Signed)
Patient Care Team: [name] as PCP - General (Physician Assistant) Lorretta Harp, MD as PCP - Cardiology (Cardiology) Nicholas Lose, MD as Consulting Physician (Hematology and Oncology) Eppie Gibson, MD as Attending Physician (Radiation Oncology) Erroll Luna, MD as Consulting Physician (General Surgery) Delice Bison Charlestine Massed, NP as Nurse Practitioner (Hematology and Oncology) Renato Shin, MD (Inactive) as Consulting Physician (Endocrinology)  DIAGNOSIS: No diagnosis found.  SUMMARY OF ONCOLOGIC HISTORY: Oncology History  Ductal carcinoma in situ (DCIS) of left breast  04/24/2017 Initial Diagnosis   Screening detected left breast calcifications in 3 areas UOQ, anterior group of calcifications 1.4 cm biopsy revealed high-grade DCIS with calcifications ER 95%, PR 95%, other 2 areas benign fibrocystic changes, Tis N0 stage 0   05/30/2017 Surgery   Left Lumpectomy: DCIS Intermediate Grade with necrosis and calcs, 0.9 cm, Er: 95%, PR 95%, TisNx (Stage 0)   07/06/2017 Genetic Testing   The Common Hereditary Cancer Panel + Thyroid cancer Panel was ordered.  The following genes were evaluated for sequence changes and exonic deletions/duplications: APC, ATM, AXIN2, BARD1, BMPR1A, BRCA1, BRCA2, BRIP1, CDH1, CDK4, CDKN2A (p14ARF), CDKN2A (p16INK4a), CHEK2, CTNNA1, DICER1, EPCAM*, GREM1*, KIT, MEN1, MLH1, MSH2, MSH3, MSH6, MUTYH, NBN, NF1, PALB2, PDGFRA, PMS2, POLD1, POLE, PRKAR1A, PTEN, RAD50, RAD51C, RAD51D, RET, SDHB, SDHC, SDHD, SMAD4, SMARCA4, STK11, TP53, TSC1, TSC2, VHL.  The following genes were evaluated for sequence changes only: HOXB13*, NTHL1*, SDHA  Results: Negative, no pathogenic variants identified.  The date of this test report is 07/06/2017.   07/10/2017 - 08/06/2017 Radiation Therapy   Adjuvant radiation therapy   08/07/2017 -  Anti-estrogen oral therapy   Adjuvant antiestrogen therapy with tamoxifen 20 mg daily x5 years     CHIEF COMPLIANT: Follow-up  of left breast DCIS on tamoxifen  INTERVAL HISTORY: [name] is a 76 y.o. with above-mentioned history of breast DCIS. *** presents to the clinic today for a follow-up.   ALLERGIES:  is allergic to atorvastatin, fosamax [alendronate sodium], lisinopril, and tetracycline.  MEDICATIONS:  Current Outpatient Medications  Medication Sig Dispense Refill   Accu-Chek Softclix Lancets lancets 1 each by Other route 2 (two) times daily. Used to check blood sugars twice daily. DX code E11.9.     amLODipine (NORVASC) 10 MG tablet TAKE 1 TABLET BY MOUTH EVERY DAY 15 tablet 0   aspirin EC 81 MG tablet Take 81 mg by mouth daily.     Blood Glucose Monitoring Suppl (ACCU-CHEK AVIVA PLUS) w/Device KIT USE AS DIRECTED 2 TIMES DAILY 1 kit 0   calcium-vitamin D (OSCAL WITH D) 250-125 MG-UNIT tablet Take 1 tablet by mouth daily.     carvedilol (COREG) 25 MG tablet TAKE 1 TABLET BY MOUTH TWICE A DAY 30 tablet 0   Cholecalciferol (VITAMIN D3) 1.25 MG (50000 UT) CAPS Take 50,000 Units by mouth daily.     Cinnamon 500 MG capsule Take 500 mg by mouth daily.     furosemide (LASIX) 40 MG tablet Take 1 tablet (40 mg total) by mouth daily. 90 tablet 3   glucose blood test strip Used to check blood sugars twice daily. DX code E11.9. 100 each 12   hyoscyamine (LEVSIN) 0.125 MG tablet Take by mouth 3 (three) times daily as needed.     losartan-hydrochlorothiazide (HYZAAR) 100-12.5 MG tablet Take 1 tablet by mouth daily. 90 tablet 2   metFORMIN (GLUCOPHAGE-XR) 500 MG 24 hr tablet Take 4 tablets (2,000 mg total) by mouth daily with breakfast. 360 tablet 3  ondansetron (ZOFRAN-ODT) 4 MG disintegrating tablet Take 4 mg by mouth every 6 (six) hours as needed.     potassium chloride SA (KLOR-CON M20) 20 MEQ tablet Take 1 tablet (20 mEq total) by mouth daily. 90 tablet 3   pravastatin (PRAVACHOL) 20 MG tablet Take 1 tablet (20 mg total) by mouth daily. 90 tablet 2   PROLIA 60 MG/ML SOLN injection   0   tamoxifen  (NOLVADEX) 20 MG tablet Take 1 tablet (20 mg total) by mouth daily. 90 tablet 3   vitamin C (ASCORBIC ACID) 500 MG tablet Take 500 mg by mouth daily.     No current facility-administered medications for this visit.    PHYSICAL EXAMINATION: ECOG PERFORMANCE STATUS: {CHL ONC ECOG [phone]}  There were no vitals filed for this visit. There were no vitals filed for this visit.  BREAST:*** No palpable masses or nodules in either right or left breasts. No palpable axillary supraclavicular or infraclavicular adenopathy no breast tenderness or nipple discharge. (exam performed in the presence of a chaperone)  LABORATORY DATA:  I have reviewed the data as listed    Latest Ref Rng & Units 10/07/2021    2:59 PM 09/19/2021    8:29 AM 09/18/2021   10:39 PM  CMP  Glucose 70 - 99 mg/dL 124   248   BUN 8 - 27 mg/dL 30   10   Creatinine 0.57 - 1.00 mg/dL 1.13   0.89   Sodium 134 - 144 mmol/L 137   136   Potassium 3.5 - 5.2 mmol/L 4.6   3.5   Chloride 96 - 106 mmol/L 100   104   CO2 20 - 29 mmol/L 21   21   Calcium 8.7 - 10.3 mg/dL 9.8   8.8   Total Protein 6.5 - 8.1 g/dL  6.4    Total Bilirubin 0.3 - 1.2 mg/dL  0.6    Alkaline Phos 38 - 126 U/L  51    AST 15 - 41 U/L  27    ALT 0 - 44 U/L  21      Lab Results  Component Value Date   WBC 12.2 (H) 09/18/2021   HGB 12.4 09/18/2021   HCT 37.9 09/18/2021   MCV 89.2 09/18/2021   PLT 315 09/18/2021   NEUTROABS 7.4 (H) 08/29/2021    ASSESSMENT & PLAN:  No problem-specific Assessment & Plan notes found for this encounter.    No orders of the defined types were placed in this encounter.  The patient has a good understanding of the overall plan. she agrees with it. she will call with any problems that may develop before the next visit here. Total time spent: 30 mins including face to face time and time spent for planning, charting and co-ordination of care   Suzzette Righter, Pe[place] 10/28/21    I Gardiner Coins am scribing for Dr.  Lindi Adie  ***

## 2021-10-31 ENCOUNTER — Other Ambulatory Visit: Payer: Self-pay | Admitting: Hematology and Oncology

## 2021-10-31 ENCOUNTER — Other Ambulatory Visit: Payer: Self-pay

## 2021-10-31 ENCOUNTER — Inpatient Hospital Stay: Payer: Medicare HMO | Attending: Hematology and Oncology | Admitting: Hematology and Oncology

## 2021-10-31 VITALS — BP 118/56 | HR 72 | Temp 97.3°F | Resp 18 | Ht 66.0 in | Wt 185.7 lb

## 2021-10-31 DIAGNOSIS — N644 Mastodynia: Secondary | ICD-10-CM | POA: Diagnosis not present

## 2021-10-31 DIAGNOSIS — D0512 Intraductal carcinoma in situ of left breast: Secondary | ICD-10-CM

## 2021-10-31 DIAGNOSIS — Z79810 Long term (current) use of selective estrogen receptor modulators (SERMs): Secondary | ICD-10-CM | POA: Diagnosis not present

## 2021-10-31 DIAGNOSIS — Z923 Personal history of irradiation: Secondary | ICD-10-CM | POA: Insufficient documentation

## 2021-10-31 NOTE — Assessment & Plan Note (Addendum)
05/30/17:Left Lumpectomy: DCIS Intermediate Grade with necrosis and calcs, 0.9 cm, Er: 95%, PR 95%, TisNx (Stage 0) Adj RT:07/10/2017-08/06/2017  Plan: Adj Tamoxifen 20 mg daily X 5 yearsstarted 08/07/2017 Tamoxifen toxicities:No side effects from tamoxifen  Breast cancer surveillance: 1.Breast exam 06/02/21 Benign.  She complains of right breast pain.  Intermittently.  Recommended that we do a diagnostic mammogram at the breast center.  2.Mammogram 10/27/20: Benign Stable findings  3.  CT angiography chest abdomen pelvis: Small bilateral pleural effusions, cholelithiasis, sigmoid diverticulosis 4.  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  Return to clinic in 1 year for follow-up

## 2021-11-01 ENCOUNTER — Telehealth: Payer: Self-pay | Admitting: Hematology and Oncology

## 2021-11-01 NOTE — Telephone Encounter (Signed)
Scheduled appointment per 7/24 los. Left voicemail.

## 2021-11-04 ENCOUNTER — Ambulatory Visit
Admission: RE | Admit: 2021-11-04 | Discharge: 2021-11-04 | Disposition: A | Discharge status: Home / Self Care | Payer: Medicare HMO | Source: Ambulatory Visit | Attending: Hematology and Oncology | Admitting: Hematology and Oncology

## 2021-11-04 ENCOUNTER — Ambulatory Visit: Admission: RE | Admit: 2021-11-04 | Payer: Medicare HMO | Source: Ambulatory Visit

## 2021-11-04 DIAGNOSIS — D0512 Intraductal carcinoma in situ of left breast: Secondary | ICD-10-CM

## 2021-11-04 DIAGNOSIS — N644 Mastodynia: Secondary | ICD-10-CM

## 2021-11-04 DIAGNOSIS — Z853 Personal history of malignant neoplasm of breast: Secondary | ICD-10-CM | POA: Diagnosis not present

## 2021-11-04 DIAGNOSIS — R922 Inconclusive mammogram: Secondary | ICD-10-CM | POA: Diagnosis not present

## 2021-11-23 ENCOUNTER — Ambulatory Visit (INDEPENDENT_AMBULATORY_CARE_PROVIDER_SITE_OTHER): Payer: Medicare HMO | Admitting: Medical

## 2021-11-23 VITALS — BP 124/74 | HR 80 | Wt 190.4 lb

## 2021-11-23 DIAGNOSIS — I700 Atherosclerosis of aorta: Secondary | ICD-10-CM

## 2021-11-23 DIAGNOSIS — E785 Hyperlipidemia, unspecified: Secondary | ICD-10-CM | POA: Diagnosis not present

## 2021-11-23 DIAGNOSIS — E1169 Type 2 diabetes mellitus with other specified complication: Secondary | ICD-10-CM | POA: Diagnosis not present

## 2021-11-23 DIAGNOSIS — E1165 Type 2 diabetes mellitus with hyperglycemia: Secondary | ICD-10-CM | POA: Diagnosis not present

## 2021-11-23 DIAGNOSIS — M810 Age-related osteoporosis without current pathological fracture: Secondary | ICD-10-CM

## 2021-11-23 DIAGNOSIS — I10 Essential (primary) hypertension: Secondary | ICD-10-CM | POA: Diagnosis not present

## 2021-11-23 LAB — POCT GLYCOSYLATED HEMOGLOBIN (HGB A1C): Hemoglobin A1C: 8.6 % — AB (ref 4.0–5.6)

## 2021-11-23 MED ORDER — TIRZEPATIDE 2.5 MG/0.5ML ~~LOC~~ SOAJ: 2.5000 mg | SUBCUTANEOUS | 0 refills | Status: DC

## 2021-11-23 NOTE — Progress Notes (Signed)
Subjective:  [name] is a 76 y.o. female who presents for Chief Complaint  Patient presents with   med check    Med check. Was told she is early stages of cirrhosis of liver and needs medication changed     Medical team: [name], oncology [name] and Laura Browner NP, cardiology [name] formerly, endocrionlogy  Concerns:  Here for med check.  This is my first visit with her.  She was normally seeing [name] nurse practitioner here prior.  She has medical history including Diabetes type 2, hypertension, hyperlipidemia, depression, osteoporosis, aortic atherosclerosis, GERD, history of breast cancer.    She just had her gallbladder taken out in July 2023.  She said that the surgeon was concerned about her liver and wanted her to come in and discuss her liver in light of her other medications.  They felt like she may have damage from being on statin  Hyperlipidemia-she is currently on Pravachol and been on this for a while.  She had problems with a atorvastatin in the past, dizziness.  At one point she was on Crestor and is not sure why they stopped it but she did not have any problems with side effects of Crestor.  She notes that she likes carbs and breads and in the past has not always eating healthy.  She does not drink alcohol and has never been an alcohol drinker.  Her diabetes has been pretty well controlled.  She was seeing [name] at [facility] endocrinology.  Her A1c's were running good until the last 1.  She is currently on metformin and Prandin.  She has been on [place] prior but it was too expensive and had to stop it.  She was on Rybelsus for a period time and was doing fine on this but there was a mixup at the pharmacy where she could not get the medication.   She is currently taking Prandin and metformin  She is compliant with her blood pressure medicines.  Sees cardiology.  No other aggravating or relieving factors.     No other c/o.  Past Medical History:  Diagnosis Date   Abnormal bone density screening    Arthritis    Cancer ([place]) 07/2017   left breast cancer   Complication of anesthesia    "i wake up"   Diverticulosis    DM (diabetes mellitus) ([place])    Family history of colon cancer    Family history of ovarian cancer    Family history of pancreatic cancer    Family history of prostate cancer    Family history of stomach cancer    GERD 08/11/2008   Qualifier: Diagnosis of  By: Laura Jews MD, [name]    GERD (gastroesophageal reflux disease)    History of radiation therapy 07/10/17- 08/06/17   40.05 Gy directed to the left breast in 15 fractions, followed by a boost of 10 Gy in 5 fractions.    HTN (hypertension)    Hyperlipidemia    Hypertriglyceridemia 04/13/2019   Osteoporosis 06/15/2017   Personal history of radiation therapy    Poor compliance    Uncontrolled hypertension 03/20/2019   Vitamin D deficiency    Current Outpatient Medications on File Prior to Visit  Medication Sig Dispense Refill   amLODipine (NORVASC) 10 MG tablet TAKE 1 TABLET BY MOUTH EVERY DAY 15 tablet 0   aspirin EC 81 MG tablet Take 81 mg by mouth daily.     calcium-vitamin  D (OSCAL WITH D) 250-125 MG-UNIT tablet Take 1 tablet by mouth daily.     carvedilol (COREG) 25 MG tablet TAKE 1 TABLET BY MOUTH TWICE A DAY 30 tablet 0   Cholecalciferol (VITAMIN D3) 1.25 MG (50000 UT) CAPS Take 50,000 Units by mouth daily.     Cinnamon 500 MG capsule Take 500 mg by mouth daily.     furosemide (LASIX) 40 MG tablet Take 1 tablet (40 mg total) by mouth daily. 90 tablet 3   hyoscyamine (LEVSIN) 0.125 MG tablet Take by mouth 3 (three) times daily as needed.     losartan-hydrochlorothiazide (HYZAAR) 100-12.5 MG tablet Take 1 tablet by mouth daily. 90 tablet 2   metFORMIN (GLUCOPHAGE-XR) 500 MG 24 hr tablet Take 4 tablets (2,000 mg total) by mouth daily with breakfast. 360 tablet 3   ondansetron (ZOFRAN-ODT) 4 MG disintegrating tablet Take 4  mg by mouth every 6 (six) hours as needed.     potassium chloride SA (KLOR-CON M20) 20 MEQ tablet Take 1 tablet (20 mEq total) by mouth daily. 90 tablet 3   pravastatin (PRAVACHOL) 20 MG tablet Take 1 tablet (20 mg total) by mouth daily. 90 tablet 2   PROLIA 60 MG/ML SOLN injection   0   tamoxifen (NOLVADEX) 20 MG tablet Take 1 tablet (20 mg total) by mouth daily. 90 tablet 3   vitamin C (ASCORBIC ACID) 500 MG tablet Take 500 mg by mouth daily.     Accu-Chek Softclix Lancets lancets 1 each by Other route 2 (two) times daily. Used to check blood sugars twice daily. DX code E11.9.     Blood Glucose Monitoring Suppl (ACCU-CHEK AVIVA PLUS) w/Device KIT USE AS DIRECTED 2 TIMES DAILY 1 kit 0   glucose blood test strip Used to check blood sugars twice daily. DX code E11.9. 100 each 12   No current facility-administered medications on file prior to visit.     The following portions of the patient's history were reviewed and updated as appropriate: allergies, current medications, past family history, past medical history, past social history, past surgical history and problem list.  ROS Otherwise as in subjective above    Objective: BP 124/74   Pulse 80   Wt 190 lb 6.4 oz (86.4 kg)   BMI 30.73 kg/m   General appearance: alert, no distress, well developed, well nourished Neck: supple, no lymphadenopathy, no thyromegaly, no masses Heart: RRR, normal S1, S2, no murmurs Lungs: CTA bilaterally, no wheezes, rhonchi, or rales Abdomen: +bs, soft, non tender, non distended, no masses, no hepatomegaly, no splenomegaly Pulses: 2+ radial pulses, 2+ pedal pulses, normal cap refill Ext: no edema     Assessment: Encounter Diagnoses  Name Primary?   Type 2 diabetes mellitus with hyperglycemia, without long-term current use of insulin ([place]) Yes   Essential hypertension    Hyperlipidemia associated with type 2 diabetes mellitus ([place])    Age-related osteoporosis without current pathological fracture     Aortic atherosclerosis ([place])      Plan: We discussed her symptoms and concerns.  I reviewed her ultrasound of the abdomen from June 2023 showing fatty liver disease.  I also looked at the pictures she brought in from her gallbladder surgery.  We discussed that more than likely her liver changes and liver disease is related to fatty liver disease and likely not a problem related to her medication statin.  Her platelets are normal, recent liver enzymes were normal, and she is obese and has a history of a  healthy diet.  We discussed the need to work on healthy diet, losing weight.  Hyperlipidemia associate with diabetes-for now continue Pravachol as she tolerates this dose fine.  She apparently has tolerated Crestor in the past as well.  Diabetes-continue metformin and Prandin for now but discussed adding a medicine such as Mounjaro.  Not sure exactly what happened with the Rybelsus at the pharmacy but since she tolerated that medication, we may have better luck with something like the weekly injection.  We discussed the risk and benefits of medication and proper use of medication.  She was prescribed SGLT2 in the past but could not afford this.  She did not have any side effects of the medication.  Osteoporosis-she continues on Prolia doing just fine with this.  Aortic atherosclerosis-continue statin  Counseled on diet, exercise  Hypertension-continue current medication  Shatisha was seen today for med check.  Diagnoses and all orders for this visit:  Type 2 diabetes mellitus with hyperglycemia, without long-term current use of insulin (HCC)  Essential hypertension  Hyperlipidemia associated with type 2 diabetes mellitus ([place])  Age-related osteoporosis without current pathological fracture  Aortic atherosclerosis ([place])  Other orders -     tirzepatide (MOUNJARO) 2.5 MG/0.5ML Pen; Inject 2.5 mg into the skin once a week.    Follow up: 53mowith plan to titrate up on GLP1  medication

## 2021-11-23 NOTE — Addendum Note (Signed)
Addended by: Minette Headland A on: 11/23/2021 02:45 PM   Modules accepted: Orders

## 2021-12-13 ENCOUNTER — Ambulatory Visit: Payer: Medicare HMO | Attending: Physician Assistant | Admitting: Physician Assistant

## 2021-12-13 ENCOUNTER — Encounter: Payer: Self-pay | Admitting: Physician Assistant

## 2021-12-13 VITALS — BP 121/68 | HR 80 | Ht 66.0 in | Wt 188.2 lb

## 2021-12-13 DIAGNOSIS — E785 Hyperlipidemia, unspecified: Secondary | ICD-10-CM

## 2021-12-13 DIAGNOSIS — I491 Atrial premature depolarization: Secondary | ICD-10-CM

## 2021-12-13 DIAGNOSIS — E119 Type 2 diabetes mellitus without complications: Secondary | ICD-10-CM | POA: Diagnosis not present

## 2021-12-13 DIAGNOSIS — I10 Essential (primary) hypertension: Secondary | ICD-10-CM

## 2021-12-13 DIAGNOSIS — R600 Localized edema: Secondary | ICD-10-CM | POA: Diagnosis not present

## 2021-12-13 NOTE — Patient Instructions (Signed)
Medication Instructions:  Your physician recommends that you continue on your current medications as directed. Please refer to the Current Medication list given to you today.  *If you need a refill on your cardiac medications before your next appointment, please call your pharmacy*  Lab Work: NONE ordered at this time of appointment   If you have labs (blood work) drawn today and your tests are completely normal, you will receive your results only by: MyChart Message (if you have MyChart) OR A paper copy in the mail If you have any lab test that is abnormal or we need to change your treatment, we will call you to review the results.  Testing/Procedures: NONE ordered at this time of appointment   Follow-Up: At  [facility], you and your health needs are our priority.  As part of our continuing mission to provide you with exceptional heart care, we have created designated Provider Care Teams.  These Care Teams include your primary Cardiologist (physician) and Advanced Practice Providers (APPs -  Physician Assistants and Nurse Practitioners) who all work together to provide you with the care you need, when you need it.  Your next appointment:   6 month(s)  The format for your next appointment:   In Person  Provider:   Jonathan Berry, MD     Other Instructions  Important Information About Sugar       

## 2021-12-13 NOTE — Progress Notes (Signed)
Cardiology Office Note:    Date:  12/15/2021   ID:  Taimi Towe, DOB [birth year], MRN [id number]  PCP:  [name]   [place] [facility] Cardiologist:  Quay Burow, MD     Referring MD: [name]   Chief Complaint  Patient presents with   Follow-up    Seen for Dr. Gwenlyn Found    History of Present Illness:    Evan Osburn is a 76 y.o. female with a hx of hypertension, hyperlipidemia, DM2, GERD, breast cancer, osteoporosis, heart murmur, and chronic diastolic heart failure.  Patient was referred to Dr. Alvester Chou in December 2020 for heart murmur.  Blood pressure was elevated at that time.  Echocardiogram in January 2021 showed EF 60 to 65%, moderate LVH, grade 2 DD, severe mitral annular calcification with no evidence of MR.  She presented to the ED in June 2023 complaining of chest pain, shortness of breath, nausea and tingling to her right arm.  Symptom lasted roughly 2 hours.  Chest pain resolved by the time she arrived in the ED.  BNP was 115.  Blood pressure was elevated.  Troponin was mildly elevated however remained flat.  Chest x-ray showed vascular congestion and was small bilateral pleural effusion.  CTA showed a small pericardial effusion measuring 5 mm in thickness, no PE or dissection.  Her right upper quadrant ultrasound showed cholelithiasis without evidence of acute cholecystitis.  Patient was discharged and recommended to follow-up with cardiology service as outpatient.  She returned to [facility] ED in June 2023 with recurrent gallbladder issue and was referred to general surgery/GI for consideration of gallbladder removal.  She was started on Lasix due to lower extremity edema.  She was cleared to proceed with gallbladder surgery from the cardiac perspective.  Repeat echocardiogram showed EF 60 to 65%, normal LV, moderate LVH, grade 1 DD, trivial pericardial effusion, no evidence of cardiac tamponade.  Patient was last seen by Diona Browner  NP on 10/07/2021, lower extremity edema has significantly improved.  Gallbladder attack was less frequent.  Patient presents for follow-up.  She has been having diarrhea for the past 1.5 weeks.  She says the diarrhea was watery.  Her grandson who is alcoholic is causing her to have a lot of anxiety issue.  She is aware that if her diarrhea does not improve, she need to see her PCP.  She denies any recent chest pain.  She has no lower extremity edema.  Her lung is clear.  She does not have irregular heartbeat on physical exam, however EKG showed sinus rhythm with PACs.  She can follow-up in 6 months.    Past Medical History:  Diagnosis Date   Abnormal bone density screening    Arthritis    Cancer ([place]) 07/2017   left breast cancer   Complication of anesthesia    "i wake up"   Diverticulosis    DM (diabetes mellitus) ([place])    Family history of colon cancer    Family history of ovarian cancer    Family history of pancreatic cancer    Family history of prostate cancer    Family history of stomach cancer    GERD 08/11/2008   Qualifier: Diagnosis of  By: Sarajane Jews MD, Ishmael Holter    GERD (gastroesophageal reflux disease)    History of radiation therapy 07/10/17- 08/06/17   40.05 Gy directed to the left breast in 15 fractions, followed by a boost of 10 Gy in 5 fractions.  HTN (hypertension)    Hyperlipidemia    Hypertriglyceridemia 04/13/2019   Osteoporosis 06/15/2017   Personal history of radiation therapy    Poor compliance    Uncontrolled hypertension 03/20/2019   Vitamin D deficiency     Past Surgical History:  Procedure Laterality Date   APPENDECTOMY     BREAST BIOPSY Left 04/24/2017   malignant   BREAST BIOPSY Left 04/24/2017   benign   BREAST BIOPSY Left 04/25/2017   benign   BREAST BIOPSY Left 04/2018   fibroadenoma w/ calcs   BREAST LUMPECTOMY Left 05/30/2017   BREAST LUMPECTOMY WITH RADIOACTIVE SEED LOCALIZATION Left 05/30/2017   Procedure: LEFT BREAST LUMPECTOMY WITH RADIOACTIVE  SEED LOCALIZATION;  Surgeon: Erroll Luna, MD;  Location: [facility];  Service: General;  Laterality: Left;   COLONOSCOPY  06/12/2005   Schooler/normal exam="i woke up during it"   [place]      Current Medications: Current Meds  Medication Sig   Accu-Chek Softclix Lancets lancets 1 each by Other route 2 (two) times daily. Used to check blood sugars twice daily. DX code E11.9.   amLODipine (NORVASC) 10 MG tablet TAKE 1 TABLET BY MOUTH EVERY DAY   aspirin EC 81 MG tablet Take 81 mg by mouth daily.   Blood Glucose Monitoring Suppl (ACCU-CHEK AVIVA PLUS) w/Device KIT USE AS DIRECTED 2 TIMES DAILY   calcium-vitamin D (OSCAL WITH D) 250-125 MG-UNIT tablet Take 1 tablet by mouth daily.   carvedilol (COREG) 25 MG tablet TAKE 1 TABLET BY MOUTH TWICE A DAY   Cholecalciferol (VITAMIN D3) 1.25 MG (50000 UT) CAPS Take 50,000 Units by mouth daily.   Cinnamon 500 MG capsule Take 500 mg by mouth daily.   furosemide (LASIX) 40 MG tablet Take 1 tablet (40 mg total) by mouth daily.   glucose blood test strip Used to check blood sugars twice daily. DX code E11.9.   hyoscyamine (LEVSIN) 0.125 MG tablet Take by mouth 3 (three) times daily as needed.   losartan-hydrochlorothiazide (HYZAAR) 100-12.5 MG tablet Take 1 tablet by mouth daily.   metFORMIN (GLUCOPHAGE-XR) 500 MG 24 hr tablet Take 4 tablets (2,000 mg total) by mouth daily with breakfast.   ondansetron (ZOFRAN-ODT) 4 MG disintegrating tablet Take 4 mg by mouth every 6 (six) hours as needed.   potassium chloride SA (KLOR-CON M20) 20 MEQ tablet Take 1 tablet (20 mEq total) by mouth daily.   pravastatin (PRAVACHOL) 20 MG tablet Take 1 tablet (20 mg total) by mouth daily.   PROLIA 60 MG/ML SOLN injection    tamoxifen (NOLVADEX) 20 MG tablet Take 1 tablet (20 mg total) by mouth daily.   tirzepatide [facility]) 2.5 MG/0.5ML Pen Inject 2.5 mg into the skin once a  week.   vitamin C (ASCORBIC ACID) 500 MG tablet Take 500 mg by mouth daily.     Allergies:   Atorvastatin, Fosamax [alendronate sodium], Lisinopril, and Tetracycline   Social History   Socioeconomic History   Marital status: Married    Spouse name: Not on file   Number of children: Not on file   Years of education: Not on file   Highest education level: Not on file  Occupational History   Not on file  Tobacco Use   Smoking status: Never   Smokeless tobacco: Never  Vaping Use   Vaping Use: Never used  Substance and Sexual Activity  Alcohol use: No   Drug use: No   Sexual activity: Not Currently  Other Topics Concern   Not on file  Social History Narrative   Not on file   Social Determinants of Health   Financial Resource Strain: Not on file  Food Insecurity: Not on file  Transportation Needs: Not on file  Physical Activity: Not on file  Stress: Not on file  Social Connections: Not on file     Family History: The patient's family history includes Cardiomyopathy in her mother; Cervical cancer in her daughter; Colon cancer (age of onset: 28) in her mother; Colon polyps in her mother; Diabetes in her maternal grandfather; Heart disease in her maternal grandmother; Ovarian cancer (age of onset: 54) in her mother; Pancreatic cancer (age of onset: 29) in her maternal aunt; Prostate cancer (age of onset: 110) in her maternal uncle; Prostate cancer (age of onset: 43) in her maternal uncle; Stomach cancer (age of onset: 56) in her maternal grandmother; Thyroid cancer in her cousin and paternal uncle. There is no history of Esophageal cancer or Rectal cancer.  ROS:   Please see the history of present illness.     All other systems reviewed and are negative.  EKGs/Labs/Other Studies Reviewed:    The following studies were reviewed today:  Echo 10/05/2021  1. Suboptimal lateral E' prime. Left ventricular ejection fraction, by  estimation, is 60 to 65%. The left ventricle has  normal function. The left  ventricle has no regional wall motion abnormalities. There is moderate  concentric left ventricular  hypertrophy. Left ventricular diastolic parameters are consistent with  Grade I diastolic dysfunction (impaired relaxation). Elevated left atrial  pressure.   2. Right ventricular systolic function is normal. The right ventricular  size is normal. There is normal pulmonary artery systolic pressure.   3. There is no evidence of cardiac tamponade. Trivial pericardial  effusion that is circumferential.   4. The mitral valve is abnormal. No evidence of mitral valve  regurgitation. No evidence of mitral stenosis. The mean mitral valve  gradient is 2.4 mmHg. Moderate mitral annular calcification.   5. The aortic valve is tricuspid. There is mild thickening of the aortic  valve. Aortic valve regurgitation is not visualized. Aortic valve  sclerosis is present, with no evidence of aortic valve stenosis.   6. The inferior vena cava is normal in size with greater than 50%  respiratory variability, suggesting right atrial pressure of 3 mmHg.   Comparison(s): No significant change from prior study.   EKG:  EKG is ordered today.  The ekg ordered today demonstrates normal sinus rhythm, no significant ST-T wave changes.  Recent Labs: 09/18/2021: Hemoglobin 12.4; Platelets 315 09/19/2021: ALT 21; B Natriuretic Peptide 115.4 10/07/2021: BUN 30; Creatinine, Ser 1.13; Potassium 4.6; Sodium 137  Recent Lipid Panel    Component Value Date/Time   CHOL 124 08/29/2021 1132   TRIG 180 (H) 08/29/2021 1132   HDL 40 08/29/2021 1132   CHOLHDL 3.1 08/29/2021 1132   CHOLHDL 3.8 02/05/2017 0858   VLDL 40 (H) 10/09/2016 1048   LDLCALC 54 08/29/2021 1132   LDLCALC 92 02/05/2017 0858   LDLDIRECT 172.1 10/16/2006 0928     Risk Assessment/Calculations:           Physical Exam:    VS:  BP 121/68   Pulse 80   Ht 5' 6" (1.676 m)   Wt 188 lb 3.2 oz (85.4 kg)   SpO2 97%   BMI 30.38  kg/m  Wt Readings from Last 3 Encounters:  12/13/21 188 lb 3.2 oz (85.4 kg)  11/23/21 190 lb 6.4 oz (86.4 kg)  10/31/21 185 lb 11.2 oz (84.2 kg)     GEN:  Well nourished, well developed in no acute distress HEENT: Normal NECK: No JVD; No carotid bruits LYMPHATICS: No lymphadenopathy CARDIAC: RRR, no murmurs, rubs, gallops RESPIRATORY:  Clear to auscultation without rales, wheezing or rhonchi  ABDOMEN: Soft, non-tender, non-distended MUSCULOSKELETAL:  No edema; No deformity  SKIN: Warm and dry NEUROLOGIC:  Alert and oriented x 3 PSYCHIATRIC:  Normal affect   ASSESSMENT:    1. PAC (premature atrial contraction)   2. Essential hypertension   3. Hyperlipidemia LDL goal <70   4. Controlled type 2 diabetes mellitus without complication, without long-term current use of insulin ([place])   5. Bilateral leg edema    PLAN:    In order of problems listed above:  PACs: Seen on today's EKG.  Patient is asymptomatic.  Hypertension: Blood pressure well controlled on current therapy  Hyperlipidemia: On pravastatin  DM2: Managed by primary care provider  Bilateral leg edema: On Lasix.  I did inform the patient that if her diarrhea worsens, may need to hold the Lasix to avoid dehydration.  She is keeping adequate hydration.  No significant leg swelling  Diarrhea: She has been having diarrhea for the past 1.5 weeks.   I advised her to see her PCP for this.         Medication Adjustments/Labs and Tests Ordered: Current medicines are reviewed at length with the patient today.  Concerns regarding medicines are outlined above.  Orders Placed This Encounter  Procedures   EKG 12-Lead   No orders of the defined types were placed in this encounter.   Patient Instructions  Medication Instructions:  Your physician recommends that you continue on your current medications as directed. Please refer to the Current Medication list given to you today.  *If you need a refill on your  cardiac medications before your next appointment, please call your pharmacy*  Lab Work: NONE ordered at this time of appointment   If you have labs (blood work) drawn today and your tests are completely normal, you will receive your results only by: [place] (if you have MyChart) OR A paper copy in the mail If you have any lab test that is abnormal or we need to change your treatment, we will call you to review the results.  Testing/Procedures: NONE ordered at this time of appointment   Follow-Up: At [facility], you and your health needs are our priority.  As part of our continuing mission to provide you with exceptional heart care, we have created designated Provider Care Teams.  These Care Teams include your primary Cardiologist (physician) and Advanced Practice Providers (APPs -  Physician Assistants and Nurse Practitioners) who all work together to provide you with the care you need, when you need it.  Your next appointment:   6 month(s)  The format for your next appointment:   In Person  Provider:   Quay Burow, MD     Other Instructions  Important Information About Sugar         Signed, Almyra Deforest, [place]  12/15/2021 10:14 PM    [place]

## 2021-12-14 ENCOUNTER — Encounter: Payer: Self-pay | Admitting: Internal Medicine

## 2021-12-15 ENCOUNTER — Encounter: Payer: Self-pay | Admitting: Physician Assistant

## 2022-02-21 ENCOUNTER — Encounter: Payer: Self-pay | Admitting: Internal Medicine

## 2022-03-09 ENCOUNTER — Telehealth: Payer: Self-pay | Admitting: Nurse Practitioner

## 2022-03-09 NOTE — Telephone Encounter (Signed)
Left message for pt to call concerning prolia

## 2022-03-15 ENCOUNTER — Encounter: Payer: Self-pay | Admitting: Family Medicine

## 2022-03-15 ENCOUNTER — Telehealth (INDEPENDENT_AMBULATORY_CARE_PROVIDER_SITE_OTHER): Payer: Medicare HMO | Admitting: Family Medicine

## 2022-03-15 ENCOUNTER — Other Ambulatory Visit (INDEPENDENT_AMBULATORY_CARE_PROVIDER_SITE_OTHER): Payer: Medicare HMO

## 2022-03-15 ENCOUNTER — Other Ambulatory Visit: Payer: Self-pay | Admitting: *Deleted

## 2022-03-15 VITALS — BP 174/95 | HR 77 | Temp 98.3°F | Ht 66.0 in | Wt 178.0 lb

## 2022-03-15 DIAGNOSIS — R509 Fever, unspecified: Secondary | ICD-10-CM | POA: Diagnosis not present

## 2022-03-15 DIAGNOSIS — R6883 Chills (without fever): Secondary | ICD-10-CM

## 2022-03-15 DIAGNOSIS — I10 Essential (primary) hypertension: Secondary | ICD-10-CM

## 2022-03-15 DIAGNOSIS — R059 Cough, unspecified: Secondary | ICD-10-CM

## 2022-03-15 DIAGNOSIS — R5081 Fever presenting with conditions classified elsewhere: Secondary | ICD-10-CM | POA: Diagnosis not present

## 2022-03-15 DIAGNOSIS — R058 Other specified cough: Secondary | ICD-10-CM | POA: Diagnosis not present

## 2022-03-15 DIAGNOSIS — R6889 Other general symptoms and signs: Secondary | ICD-10-CM | POA: Diagnosis not present

## 2022-03-15 DIAGNOSIS — J069 Acute upper respiratory infection, unspecified: Secondary | ICD-10-CM | POA: Diagnosis not present

## 2022-03-15 LAB — POCT INFLUENZA A/B
Influenza A, POC: NEGATIVE
Influenza B, POC: NEGATIVE

## 2022-03-15 LAB — POC COVID19 BINAXNOW: SARS Coronavirus 2 Ag: NEGATIVE

## 2022-03-15 NOTE — Patient Instructions (Signed)
Drink plenty of water. Stop taking the Dayquil as this raises your blood pressure. You can use CoricidinHBP, and/or Mucinex (plain or DM, depending on which ingredients are in the Coricidin HBP). Be sure to check the labels of the coricidin and make sure that ingredient don't overlap with each other--you can also talk to the pharmacist.  Some versions of coricidin may contain dextromethorphan, which is the cough suppressant in Mucinex DM. Guaifenesin, is an expectorant that is going to keep your mucus and phlegm thin. Dextromethorphan (DM) is a cough suppressant.  Please contact us if you have recurrent fever, if your mucus or phlegm becomes and stays discolored (yellow-green), if you have worsening cough, shortness of breath or pain with breathing.  Please try and get your medication (carvedilol) refilled; your blood pressure was very today. This may be partly due to taking the Dayquil also.

## 2022-03-15 NOTE — Progress Notes (Signed)
Start time: 11:36 End time: 11:57  Virtual Visit via Video Note  I connected with [name] on 03/15/22 by a video enabled telemedicine application and verified that I am speaking with the correct person using two identifiers.  Location: Patient: home Provider: office   I discussed the limitations of evaluation and management by telemedicine and the availability of in person appointments. The patient expressed understanding and agreed to proceed.  History of Present Illness:  Chief Complaint  Patient presents with   Cough    VIRTUAL were sick over Thanksgiving with diarrhea. Cough started a couple days ago and has worsened. Has runny nose, body aches, fever and chills. Temp was 100.5 two days ago. Has not done any home covid tests.    Over Thanksgiving she reports she had a stomach bug (diarrhea ,occasional vomiting). She then developed a cold. Symptoms started on 12/1 with runny nose. Now she hsa a headache, ongoing runny nose, cough, chills. She had a fever 2 days ago to 100.5.  Headache--at the top of her head, not in her sinuses. Nasal drainage is clear. Cough is dry--nonproductive, but husband says he hears "rattling in her lungs" Denies any shortness of breath or pain with breathing. She has been sleeping a lot for the last 3 days. Coughing all the time, not worse when she lays down.  She has been taking Dayquil, once or twice a day.  Was in [facility] last week, around sick people.  She has not had any COVID vaccines.  HTN--ran out of carvedilol.  Is under the care of the cardiologist. She reports her BP was running 125-130/70s prior to running out of medication.  PMH, PSH, SH reviewed  DM, HTN, HLD, osteoporosis   Outpatient Encounter Medications as of 03/15/2022  Medication Sig Note   Accu-Chek Softclix Lancets lancets 1 each by Other route 2 (two) times daily. Used to check blood sugars twice daily. DX code E11.9.    amLODipine (NORVASC) 10 MG tablet TAKE 1  TABLET BY MOUTH EVERY DAY    aspirin EC 81 MG tablet Take 81 mg by mouth daily.    Blood Glucose Monitoring Suppl (ACCU-CHEK AVIVA PLUS) w/Device KIT USE AS DIRECTED 2 TIMES DAILY    calcium-vitamin D (OSCAL WITH D) 250-125 MG-UNIT tablet Take 1 tablet by mouth daily.    cholecalciferol (VITAMIN D3) 25 MCG (1000 UNIT) tablet Take 1,000 Units by mouth daily.    furosemide (LASIX) 40 MG tablet Take 1 tablet (40 mg total) by mouth daily.    glucose blood test strip Used to check blood sugars twice daily. DX code E11.9.    hyoscyamine (LEVSIN) 0.125 MG tablet Take by mouth 3 (three) times daily as needed.    losartan-hydrochlorothiazide (HYZAAR) 100-12.5 MG tablet Take 1 tablet by mouth daily.    metFORMIN (GLUCOPHAGE-XR) 500 MG 24 hr tablet Take 4 tablets (2,000 mg total) by mouth daily with breakfast. (Patient taking differently: Take 1,000 mg by mouth daily with breakfast.)    potassium chloride SA (KLOR-CON M20) 20 MEQ tablet Take 1 tablet (20 mEq total) by mouth daily.    pravastatin (PRAVACHOL) 20 MG tablet Take 1 tablet (20 mg total) by mouth daily.    PROLIA 60 MG/ML SOLN injection     Pseudoephedrine-APAP-DM (DAYQUIL PO) Take 2 capsules by mouth. 03/15/2022: Last dose 1am   tamoxifen (NOLVADEX) 20 MG tablet Take 1 tablet (20 mg total) by mouth daily.    vitamin C (ASCORBIC ACID) 500 MG tablet Take 500  mg by mouth daily.    [DISCONTINUED] tirzepatide [facility]) 2.5 MG/0.5ML Pen Inject 2.5 mg into the skin once a week.    carvedilol (COREG) 25 MG tablet TAKE 1 TABLET BY MOUTH TWICE A DAY (Patient not taking: Reported on 03/15/2022) 03/15/2022: Ran out   Cinnamon 500 MG capsule Take 500 mg by mouth daily. (Patient not taking: Reported on 03/15/2022)    ondansetron (ZOFRAN-ODT) 4 MG disintegrating tablet Take 4 mg by mouth every 6 (six) hours as needed. (Patient not taking: Reported on 03/15/2022) 03/15/2022: As needed   [DISCONTINUED] Cholecalciferol (VITAMIN D3) 1.25 MG (50000 UT) CAPS Take 50,000  Units by mouth daily.    No facility-administered encounter medications on file as of 03/15/2022.   Allergies  Allergen Reactions   Atorvastatin Other (See Comments)    dizziness   Fosamax [Alendronate Sodium]     Cough,trouble shallowing, chest pain   Lisinopril Cough   Tetracycline Nausea And Vomiting   ROS:  fever, URI symptoms per HPI.  No chest pain, shortness of breath, n/v/d (recent GI illness, resolved).  No dizziness.  HA on top of head, no sinus or ear pain. See HPI   Observations/Objective:  BP (!) 174/95   Pulse 77   Temp 98.3 F (36.8 C) (Temporal)   Ht _0  (1.676 m)   Wt 178 lb (80.7 kg)   BMI 28.73 kg/m   Well-appearing, pleasant female. She is in no distress, speaking comfortably. Sounds mildly congested, no coughing during the visit. Cranial nerves are grossly intact. She is alert and oriented. Exam is limited due to the virtual nature of the visit.  Negative rapid COVID test and Influenza A&B   Assessment and Plan:  Viral upper respiratory illness - negative for flu and COVID. Supportive measures reviewed, including s/sx of bacterial infection and when to return for re-eval  Essential hypertension - BP high today--unsure if related to use of decongestant and/or not taking her carvedilol. To contact cardiology for refill on meds  Chart reviewed after visit. Last seen by Audelia Acton in August, rx'd Mounjaro. Not taking it. Past due for med check on DM, doesn't have any appts scheduled. Will ensure that she schedules for a med check.  Drink plenty of water. Stop taking the Dayquil as this raises your blood pressure. You can use CoricidinHBP, and/or Mucinex (plain or DM, depending on which ingredients are in the Coricidin HBP). Be sure to check the labels of the coricidin and make sure that ingredient don't overlap with each other--you can also talk to the pharmacist.  Some versions of coricidin may contain dextromethorphan, which is the cough suppressant in  Mucinex DM. Guaifenesin, is an expectorant that is going to keep your mucus and phlegm thin. Dextromethorphan (DM) is a cough suppressant.  Please contact us if you have recurrent fever, if your mucus or phlegm becomes and stays discolored (yellow-green), if you have worsening cough, shortness of breath or pain with breathing.  Please try and get your medication (carvedilol) refilled; your blood pressure was very today. This may be partly due to taking the Dayquil also.   Follow Up Instructions:    I discussed the assessment and treatment plan with the patient. The patient was provided an opportunity to ask questions and all were answered. The patient agreed with the plan and demonstrated an understanding of the instructions.   The patient was advised to call back or seek an in-person evaluation if the symptoms worsen or if the condition fails to improve as  anticipated.  I spent 24 minutes dedicated to the care of this patient, including pre-visit review of records, face to face time, post-visit ordering of testing and documentation.    Vikki Ports, MD

## 2022-03-24 ENCOUNTER — Telehealth: Payer: Self-pay | Admitting: Nurse Practitioner

## 2022-03-24 NOTE — Telephone Encounter (Signed)
This pt will be a pt of Sarabeth's. She was originally placed on Prolia by [employer]. I have been trying to reach her concerning her Prolia since November and today I was able to speak to her. I advised that her estimated cost for Prolia was going to be $317 which was increase from last time, which was $302. She advised that she can't not afford medication at this time and is not interested in being on it anymore. I informed pt about Laretta Bolster and that I would advise of decision to stop Prolia. Pt can be reached at [phone].

## 2022-04-06 NOTE — Telephone Encounter (Signed)
I have made several attempts to contact pt. Message on phone states "not taking calls at this time". I will continue to try to contact by phone, message thru email or send a letter.

## 2022-05-19 ENCOUNTER — Ambulatory Visit (INDEPENDENT_AMBULATORY_CARE_PROVIDER_SITE_OTHER): Payer: Medicare HMO | Admitting: Nurse Practitioner

## 2022-05-19 ENCOUNTER — Encounter: Payer: Self-pay | Admitting: Nurse Practitioner

## 2022-05-19 VITALS — BP 122/78 | HR 88 | Wt 179.6 lb

## 2022-05-19 DIAGNOSIS — R040 Epistaxis: Secondary | ICD-10-CM | POA: Insufficient documentation

## 2022-05-19 HISTORY — DX: Epistaxis: R04.0

## 2022-05-19 NOTE — Patient Instructions (Signed)
Dear [name],  Thank you for coming in for your visit today. Based on our conversation, please follow these instructions:  1. Apply a small amount of antibacterial ointment to the irritated area inside your nostril using a Q-tip. You can do this for both sides if you'd like. This should help moisturize and heal the irritated area.  2. Use the saline gel provided to keep your nasal passages moist and prevent further irritation.  3. Continue applying the ointment and saline gel for at least four to five days, or until the irritation and nosebleeds subside.  4. If the nosebleeds persist or worsen, please contact our office for a follow-up appointment.  5. We will see you in a couple of months for a routine checkup. At that time, we can also discuss any concerns you may have about body aches in your hips and feet.  Please remember to reach out to our office if you have any questions or if the nosebleeds continue. We are here to help and support you in maintaining your health.  Thank you again for your visit and take care.  Sincerely,  [place]

## 2022-05-19 NOTE — Assessment & Plan Note (Signed)
Frequent nosebleeds reported by the patient, occurring four times daily, each lasting a few minutes every day this week. No associated sinus pain, pressure, or dizziness. Physical exam reveals nasal irritation likely due to dryness. Appx 86m area of bleeding mucosa visualized.    Plan:  - Recommend the use of antibacterial ointment and saline gel applied with a Q-tip to the irritated area for 4-5 days to aid in healing and maintain moisture. Follow-up visit recommended if symptoms persist or worsen.

## 2022-05-19 NOTE — Progress Notes (Signed)
Orma Render, DNP, AGNP-c [place] [address] [place], [place] [address] [phone]  Subjective:   [name] is a 77 y.o. female presents to day for evaluation of: [name]  presents today with concerns regarding recurrent nosebleeds that she has been experiencing. She has noticed that the nosebleeds occur approximately four times a day and have been persistent for the past week. The bleeding is described as not severe, lasting only a couple of minutes each time, and is primarily affecting one side of her nose (right). She has not experienced any sinus pain or pressure and believes the nosebleeds may be due to the dry air in her home, as the heat has been running for three months.  Currently, the patient is on a regimen of aspirin 81 mg, which she takes as a blood thinner. She has not reported the use of any other medications that might contribute to the frequency of her nosebleeds. Additionally, she denies any dizziness associated with the bleeding episodes.  The patient has been proactive in maintaining hydration by drinking plenty of water throughout the day and night. She is eager for the nosebleeds to stop and is willing to consider the use of antibacterial ointment and saline gel as potential treatments.  PMH, Medications, and Allergies reviewed and updated in chart as appropriate.   ROS negative except for what is listed in HPI. Objective:  BP 122/78   Pulse 88   Wt 179 lb 9.6 oz (81.5 kg)   BMI 28.99 kg/m  Physical Exam Vitals and nursing note reviewed.  Constitutional:      General: She is not in acute distress.    Appearance: Normal appearance.  HENT:     Head: Normocephalic.     Nose: Laceration present.     Right Nostril: Epistaxis present.     Left Nostril: No epistaxis.     Comments: Appx 22m laceration noted to the internal mucosa of the right nare on the lateral side.  Eyes:     Pupils: Pupils are equal, round, and reactive to  light.  Cardiovascular:     Rate and Rhythm: Normal rate and regular rhythm.     Pulses: Normal pulses.     Heart sounds: Normal heart sounds. No murmur heard. Pulmonary:     Effort: Pulmonary effort is normal.     Breath sounds: Normal breath sounds. No wheezing.  Musculoskeletal:     Cervical back: Normal range of motion.  Lymphadenopathy:     Cervical: No cervical adenopathy.  Skin:    General: Skin is warm and dry.     Capillary Refill: Capillary refill takes less than 2 seconds.  Neurological:     General: No focal deficit present.     Mental Status: She is alert and oriented to person, place, and time.  Psychiatric:        Mood and Affect: Mood normal.        Behavior: Behavior normal.           Assessment & Plan:   Problem List Items Addressed This Visit     Right-sided epistaxis - Primary     Frequent nosebleeds reported by the patient, occurring four times daily, each lasting a few minutes every day this week. No associated sinus pain, pressure, or dizziness. Physical exam reveals nasal irritation likely due to dryness. Appx 349marea of bleeding mucosa visualized.    Plan:  - Recommend the use of antibacterial ointment and saline gel applied with  a Q-tip to the irritated area for 4-5 days to aid in healing and maintain moisture. Follow-up visit recommended if symptoms persist or worsen.         Orma Render, DNP, AGNP-c 05/19/2022  7:01 PM    History, Medications, Surgery, SDOH, and Family History reviewed and updated as appropriate.

## 2022-06-05 ENCOUNTER — Ambulatory Visit: Payer: Medicare HMO | Admitting: Hematology and Oncology

## 2022-06-20 ENCOUNTER — Encounter: Payer: Self-pay | Admitting: Cardiovascular Disease

## 2022-06-20 ENCOUNTER — Ambulatory Visit: Payer: Medicare HMO | Attending: Cardiovascular Disease | Admitting: Cardiovascular Disease

## 2022-06-20 VITALS — BP 136/72 | HR 65 | Ht 66.0 in | Wt 185.4 lb

## 2022-06-20 DIAGNOSIS — I5032 Chronic diastolic (congestive) heart failure: Secondary | ICD-10-CM | POA: Diagnosis not present

## 2022-06-20 DIAGNOSIS — I10 Essential (primary) hypertension: Secondary | ICD-10-CM

## 2022-06-20 DIAGNOSIS — E782 Mixed hyperlipidemia: Secondary | ICD-10-CM

## 2022-06-20 DIAGNOSIS — E785 Hyperlipidemia, unspecified: Secondary | ICD-10-CM

## 2022-06-20 MED ORDER — CARVEDILOL 25 MG PO TABS: 25.0000 mg | ORAL_TABLET | Freq: Two times a day (BID) | ORAL | 3 refills | Status: DC

## 2022-06-20 NOTE — Assessment & Plan Note (Signed)
>>  ASSESSMENT AND PLAN FOR MIXED HYPERLIPIDEMIA WRITTEN ON 06/20/2022 10:46 AM BY BERRY, Delton See, MD  History of hyperlipidemia on statin therapy lipid profile performed 08/29/2021 revealing total cholesterol 124, LDL 54 and HDL 40.

## 2022-06-20 NOTE — Assessment & Plan Note (Signed)
History of hyperlipidemia on statin therapy lipid profile performed 08/29/2021 revealing total cholesterol 124, LDL 54 and HDL 40.

## 2022-06-20 NOTE — Progress Notes (Signed)
06/20/2022 Cristian Slavey   Sep 22, 1945  [id number]  Primary Physician Early, Coralee Pesa, NP Primary Cardiologist: Lorretta Harp MD Lupe Carney, [place]  HPI:  [name] is a 77 y.o.  moderately overweight married Caucasian female mother of 2, grandmother and 3 grandchildren . 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 She was referred by Fayrene Fearing NP  .  I last saw her in the office 04/08/2019.  She has a history of treated hypertension and hyperlipidemia. She has never smoked. There is no family history. She's never had a heart attack or stroke. She has had this cancer and had surgical excision of this by Dr. Brantley Stage recently and underwent radiation therapy.   Since I saw her in the office 3 years ago she has remained stable.  She did see Almyra Deforest  PA-C in the office 12/13/2021.  She had a 2D echocardiogram performed 10/05/2021 revealing normal LV systolic function, moderate concentric LVH with grade 1 diastolic dysfunction.  She is on oral diuretic.  She denies chest pain or shortness of breath.   No outpatient medications have been marked as taking for the 06/20/22 encounter (Office Visit) with Lorretta Harp, MD.     Allergies  Allergen Reactions   Atorvastatin Other (See Comments)    dizziness   Fosamax [Alendronate Sodium]     Cough,trouble shallowing, chest pain   Lisinopril Cough   Tetracycline Nausea And Vomiting    Social History   Socioeconomic History   Marital status: Married    Spouse name: Not on file   Number of children: Not on file   Years of education: Not on file   Highest education level: Not on file  Occupational History   Not on file  Tobacco Use   Smoking status: Never   Smokeless tobacco: Never  Vaping Use   Vaping Use: Never used  Substance and Sexual Activity   Alcohol use: No   Drug use: No   Sexual activity: Not Currently  Other Topics Concern   Not on file  Social History Narrative   Not on  file   Social Determinants of Health   Financial Resource Strain: Not on file  Food Insecurity: Not on file  Transportation Needs: Not on file  Physical Activity: Not on file  Stress: Not on file  Social Connections: Not on file  Intimate Partner Violence: Not on file     Review of Systems: General: negative for chills, fever, night sweats or weight changes.  Cardiovascular: negative for chest pain, dyspnea on exertion, edema, orthopnea, palpitations, paroxysmal nocturnal dyspnea or shortness of breath Dermatological: negative for rash Respiratory: negative for cough or wheezing Urologic: negative for hematuria Abdominal: negative for nausea, vomiting, diarrhea, bright red blood per rectum, melena, or hematemesis Neurologic: negative for visual changes, syncope, or dizziness All other systems reviewed and are otherwise negative except as noted above.    Blood pressure 136/72, pulse 65, height '5\' 6"'$  (1.676 m), weight 185 lb 6.4 oz (84.1 kg), SpO2 98 %.  General appearance: alert and no distress Neck: no adenopathy, no carotid bruit, no JVD, supple, symmetrical, trachea midline, and thyroid not enlarged, symmetric, no tenderness/mass/nodules Lungs: clear to auscultation bilaterally Heart: regular rate and rhythm, S1, S2 normal, no murmur, click, rub or gallop Extremities: extremities normal, atraumatic, no cyanosis or edema Pulses: 2+ and symmetric Skin: Skin color, texture, turgor normal. No rashes or lesions Neurologic: Grossly normal  EKG sinus  rhythm at 55 without ST or T wave changes.  Personally reviewed this EKG.  ASSESSMENT AND PLAN:   Essential hypertension History of essential hypertension a blood pressure measured today at 136/72.  She is on amlodipine, losartan and hydrochlorothiazide.  Mixed hyperlipidemia History of hyperlipidemia on statin therapy lipid profile performed 08/29/2021 revealing total cholesterol 124, LDL 54 and HDL 40.  Chronic diastolic heart  failure (HCC) 2D echocardiogram performed 10/05/2021 revealed normal LV systolic function with grade 1 diastolic dysfunction.  There were no significant valvular abnormalities.  She is on oral diuretic and has minimal peripheral edema.  She watch her salt intake.     Lorretta Harp MD FACP,FACC,FAHA, [facility] 06/20/2022 10:47 AM

## 2022-06-20 NOTE — Patient Instructions (Signed)
Medication Instructions:  Your physician recommends that you continue on your current medications as directed. Please refer to the Current Medication list given to you today.  *If you need a refill on your cardiac medications before your next appointment, please call your pharmacy*    Follow-Up: At [facility], you and your health needs are our priority.  As part of our continuing mission to provide you with exceptional heart care, we have created designated Provider Care Teams.  These Care Teams include your primary Cardiologist (physician) and Advanced Practice Providers (APPs -  Physician Assistants and Nurse Practitioners) who all work together to provide you with the care you need, when you need it.  We recommend signing up for the patient portal called "MyChart".  Sign up information is provided on this After Visit Summary.  MyChart is used to connect with patients for Virtual Visits (Telemedicine).  Patients are able to view lab/test results, encounter notes, upcoming appointments, etc.  Non-urgent messages can be sent to your provider as well.   To learn more about what you can do with MyChart, go to [email or web address].    Your next appointment:   We will see you on an as needed basis.  Provider:   Jonathan Berry, MD  

## 2022-06-20 NOTE — Assessment & Plan Note (Signed)
History of essential hypertension a blood pressure measured today at 136/72.  She is on amlodipine, losartan and hydrochlorothiazide.

## 2022-06-20 NOTE — Assessment & Plan Note (Signed)
2D echocardiogram performed 10/05/2021 revealed normal LV systolic function with grade 1 diastolic dysfunction.  There were no significant valvular abnormalities.  She is on oral diuretic and has minimal peripheral edema.  She watch her salt intake.

## 2022-07-27 ENCOUNTER — Other Ambulatory Visit: Payer: Self-pay

## 2022-07-27 DIAGNOSIS — E1165 Type 2 diabetes mellitus with hyperglycemia: Secondary | ICD-10-CM

## 2022-08-02 ENCOUNTER — Encounter: Payer: Self-pay | Admitting: Nurse Practitioner

## 2022-08-02 ENCOUNTER — Ambulatory Visit (INDEPENDENT_AMBULATORY_CARE_PROVIDER_SITE_OTHER): Payer: Medicare HMO | Admitting: Nurse Practitioner

## 2022-08-02 VITALS — BP 128/80 | HR 73 | Wt 182.0 lb

## 2022-08-02 DIAGNOSIS — M5441 Lumbago with sciatica, right side: Secondary | ICD-10-CM | POA: Insufficient documentation

## 2022-08-02 MED ORDER — KETOROLAC TROMETHAMINE 60 MG/2ML IM SOLN
60.0000 mg | Freq: Once | INTRAMUSCULAR | Status: AC
  Administered 2022-08-02: 60 mg via INTRAMUSCULAR

## 2022-08-02 MED ORDER — METHYLPREDNISOLONE ACETATE 40 MG/ML IJ SUSP
40.0000 mg | Freq: Once | INTRAMUSCULAR | Status: AC
  Administered 2022-08-02: 40 mg via INTRAMUSCULAR

## 2022-08-02 MED ORDER — HYDROCODONE-ACETAMINOPHEN 5-325 MG PO TABS: 1.0000 | ORAL_TABLET | Freq: Four times a day (QID) | ORAL | 0 refills | Status: DC | PRN

## 2022-08-02 NOTE — Assessment & Plan Note (Signed)
Right sided sciatica flair with ongoing pain for nearly 1 month. Unfortunately, all over the counter treatment options have been explored including oral and topical medications. She has also tried opiate pain reliever and muscle relaxant. The only treatment that has provided relief is heat and hydrocodone. We discussed ongoing options today including imaging, pain management, and referral. At this time, she would like to have a referral to the spinal specialist to see if there are options to help reduce the risk of recurrence. She is open to steroid and toradol injection today in an effort to help reduce inflammation and pain. She is open to imaging.  Plan: - X-ray orders placed for [facility]. You can walk in to have this done at [address].  - Referral placed for spinal specialist for further evaluation and management.  - Stretches provided to try at home - Continue to use heat if this helps with the pain - Hydrocodone has been sent to the pharmacy for short term use - Toradol and Depo medrol injections given in the office today.

## 2022-08-02 NOTE — Patient Instructions (Signed)
I have given you a shot of Toradol and Steroid to help with the pain and swelling that is present. You may have an increase in your blood sugars for a few days, this is ok.   I have sent the pain medication to the pharmacy.  I have also sent the order for the spine specialist.   I have placed an order for an x-ray. You can have this done at [address] at [facility]. You can just walk in to have this done at your convenience.

## 2022-08-02 NOTE — Progress Notes (Signed)
Tollie Eth, DNP, AGNP-c [facility] [address] [place], [place] [address] [phone]  Subjective:   [name] is a 77 y.o. female presents to day for evaluation of: Low back pain on the right Fontella Shan tells me that she has a chronic history of low back pain with sciatic nerve involvement. This condition will flair from time to time and resolve on it's own in 3-4 days without significant interference. She tells me her current episode has been present for the past 4 weeks and has become significantly worse with time resulting in severe pain with radiation into the right buttock and down the right leg. She has not had any known injury. She reports the pain will ease slightly with heat, but returns within a short period of time. She has tried numerous over the counter medications without any relief. She also tried her SO's robaxin and that did not help. She did have some left over hydrocodone from a surgery last summer and she took that at bedtime one night and that was the only thing that allowed her enough relief to rest.   PMH, Medications, and Allergies reviewed and updated in chart as appropriate.   ROS negative except for what is listed in HPI. Objective:  BP 128/80   Pulse 73   Wt 182 lb (82.6 kg)   BMI 29.38 kg/m  Physical Exam Vitals and nursing note reviewed.  Constitutional:      General: She is in acute distress.  HENT:     Head: Normocephalic.  Eyes:     Pupils: Pupils are equal, round, and reactive to light.  Cardiovascular:     Pulses: Normal pulses.  Pulmonary:     Effort: Pulmonary effort is normal.  Musculoskeletal:        General: Tenderness present.     Cervical back: Normal range of motion.     Lumbar back: Tenderness present. No bony tenderness. Decreased range of motion. Positive right straight leg raise test.       Back:  Lymphadenopathy:     Cervical: No cervical adenopathy.  Skin:    General: Skin is warm and dry.      Capillary Refill: Capillary refill takes less than 2 seconds.  Neurological:     General: No focal deficit present.     Mental Status: She is alert and oriented to person, place, and time.     Sensory: No sensory deficit.     Motor: Weakness present.     Gait: Gait abnormal.     Deep Tendon Reflexes: Reflexes normal.  Psychiatric:        Mood and Affect: Mood normal.           Assessment & Plan:   Problem List Items Addressed This Visit     Acute right-sided low back pain with right-sided sciatica - Primary    Right sided sciatica flair with ongoing pain for nearly 1 month. Unfortunately, all over the counter treatment options have been explored including oral and topical medications. She has also tried opiate pain reliever and muscle relaxant. The only treatment that has provided relief is heat and hydrocodone. We discussed ongoing options today including imaging, pain management, and referral. At this time, she would like to have a referral to the spinal specialist to see if there are options to help reduce the risk of recurrence. She is open to steroid and toradol injection today in an effort to help reduce inflammation and pain. She is open  to imaging.  Plan: - X-ray orders placed for [facility]. You can walk in to have this done at [address].  - Referral placed for spinal specialist for further evaluation and management.  - Stretches provided to try at home - Continue to use heat if this helps with the pain - Hydrocodone has been sent to the pharmacy for short term use - Toradol and Depo medrol injections given in the office today.       Relevant Medications   HYDROcodone-acetaminophen (NORCO/VICODIN) 5-325 MG tablet   Other Relevant Orders   Ambulatory referral to Spine Surgery   DG Lumbar Spine 2-3 Views      Tollie Eth, DNP, AGNP-c 08/02/2022  11:00 AM    Time: 22 minutes, >50% spent counseling, care coordination, chart review, and  documentation.    History, Medications, Surgery, SDOH, and Family History reviewed and updated as appropriate.

## 2022-08-03 ENCOUNTER — Ambulatory Visit
Admission: RE | Admit: 2022-08-03 | Discharge: 2022-08-03 | Disposition: A | Discharge status: Home / Self Care | Payer: Medicare HMO | Source: Ambulatory Visit | Attending: Nurse Practitioner | Admitting: Nurse Practitioner

## 2022-08-03 ENCOUNTER — Telehealth: Payer: Self-pay | Admitting: Nurse Practitioner

## 2022-08-03 DIAGNOSIS — M5441 Lumbago with sciatica, right side: Secondary | ICD-10-CM

## 2022-08-03 DIAGNOSIS — M545 Low back pain, unspecified: Secondary | ICD-10-CM | POA: Diagnosis not present

## 2022-08-03 DIAGNOSIS — M79671 Pain in right foot: Secondary | ICD-10-CM | POA: Diagnosis not present

## 2022-08-03 DIAGNOSIS — M5136 Other intervertebral disc degeneration, lumbar region: Secondary | ICD-10-CM | POA: Diagnosis not present

## 2022-08-03 NOTE — Telephone Encounter (Signed)
Pt called and states that she is still in a lot of pain, and the pain medicine is not helping, she wants to know if you would lsend her to the back dr she can be reached at [phone]

## 2022-08-04 NOTE — Telephone Encounter (Signed)
A referral was placed at the time of the visit for the spinal specialist. II have reviewed the x-ray. It shows significant stool burden, which could be contributing to her current level of pain. I recommend that she take a laxative to help facilitate a bowel movement to see if this is helpful. I am waiting on the radiologist report of the x-ray.

## 2022-08-07 ENCOUNTER — Encounter: Payer: Self-pay | Admitting: Nurse Practitioner

## 2022-08-09 DIAGNOSIS — M5416 Radiculopathy, lumbar region: Secondary | ICD-10-CM | POA: Diagnosis not present

## 2022-08-11 ENCOUNTER — Other Ambulatory Visit (HOSPITAL_BASED_OUTPATIENT_CLINIC_OR_DEPARTMENT_OTHER): Payer: Self-pay | Admitting: Neurosurgery

## 2022-08-11 ENCOUNTER — Telehealth (HOSPITAL_BASED_OUTPATIENT_CLINIC_OR_DEPARTMENT_OTHER): Payer: Self-pay

## 2022-08-11 DIAGNOSIS — M5416 Radiculopathy, lumbar region: Secondary | ICD-10-CM

## 2022-08-18 DIAGNOSIS — M25552 Pain in left hip: Secondary | ICD-10-CM | POA: Diagnosis not present

## 2022-08-18 DIAGNOSIS — M25551 Pain in right hip: Secondary | ICD-10-CM | POA: Diagnosis not present

## 2022-08-18 DIAGNOSIS — M5416 Radiculopathy, lumbar region: Secondary | ICD-10-CM | POA: Diagnosis not present

## 2022-08-18 DIAGNOSIS — R2681 Unsteadiness on feet: Secondary | ICD-10-CM | POA: Diagnosis not present

## 2022-08-20 ENCOUNTER — Ambulatory Visit (HOSPITAL_BASED_OUTPATIENT_CLINIC_OR_DEPARTMENT_OTHER)
Admission: RE | Admit: 2022-08-20 | Discharge: 2022-08-20 | Disposition: A | Discharge status: Home / Self Care | Payer: Medicare HMO | Source: Ambulatory Visit | Attending: Neurosurgery | Admitting: Neurosurgery

## 2022-08-20 DIAGNOSIS — M5416 Radiculopathy, lumbar region: Secondary | ICD-10-CM | POA: Diagnosis not present

## 2022-08-20 DIAGNOSIS — M545 Low back pain, unspecified: Secondary | ICD-10-CM | POA: Diagnosis not present

## 2022-08-22 DIAGNOSIS — R2681 Unsteadiness on feet: Secondary | ICD-10-CM | POA: Diagnosis not present

## 2022-08-22 DIAGNOSIS — M25551 Pain in right hip: Secondary | ICD-10-CM | POA: Diagnosis not present

## 2022-08-22 DIAGNOSIS — M25552 Pain in left hip: Secondary | ICD-10-CM | POA: Diagnosis not present

## 2022-08-22 DIAGNOSIS — M5416 Radiculopathy, lumbar region: Secondary | ICD-10-CM | POA: Diagnosis not present

## 2022-08-24 DIAGNOSIS — R2681 Unsteadiness on feet: Secondary | ICD-10-CM | POA: Diagnosis not present

## 2022-08-24 DIAGNOSIS — M5416 Radiculopathy, lumbar region: Secondary | ICD-10-CM | POA: Diagnosis not present

## 2022-08-24 DIAGNOSIS — M25552 Pain in left hip: Secondary | ICD-10-CM | POA: Diagnosis not present

## 2022-08-24 DIAGNOSIS — M25551 Pain in right hip: Secondary | ICD-10-CM | POA: Diagnosis not present

## 2022-08-28 DIAGNOSIS — M25552 Pain in left hip: Secondary | ICD-10-CM | POA: Diagnosis not present

## 2022-08-28 DIAGNOSIS — Z6831 Body mass index (BMI) 31.0-31.9, adult: Secondary | ICD-10-CM | POA: Diagnosis not present

## 2022-08-28 DIAGNOSIS — M5416 Radiculopathy, lumbar region: Secondary | ICD-10-CM | POA: Diagnosis not present

## 2022-08-28 DIAGNOSIS — M25551 Pain in right hip: Secondary | ICD-10-CM | POA: Diagnosis not present

## 2022-08-28 DIAGNOSIS — R2681 Unsteadiness on feet: Secondary | ICD-10-CM | POA: Diagnosis not present

## 2022-08-30 DIAGNOSIS — M25551 Pain in right hip: Secondary | ICD-10-CM | POA: Diagnosis not present

## 2022-08-30 DIAGNOSIS — M25552 Pain in left hip: Secondary | ICD-10-CM | POA: Diagnosis not present

## 2022-08-30 DIAGNOSIS — R2681 Unsteadiness on feet: Secondary | ICD-10-CM | POA: Diagnosis not present

## 2022-08-30 DIAGNOSIS — M5416 Radiculopathy, lumbar region: Secondary | ICD-10-CM | POA: Diagnosis not present

## 2022-10-08 ENCOUNTER — Other Ambulatory Visit: Payer: Self-pay | Admitting: Medical

## 2022-10-08 ENCOUNTER — Other Ambulatory Visit: Payer: Self-pay | Admitting: Nurse Practitioner

## 2022-10-08 ENCOUNTER — Other Ambulatory Visit: Payer: Self-pay | Admitting: Physician Assistant

## 2022-10-08 ENCOUNTER — Other Ambulatory Visit: Payer: Self-pay | Admitting: Hematology and Oncology

## 2022-10-08 DIAGNOSIS — I5189 Other ill-defined heart diseases: Secondary | ICD-10-CM

## 2022-10-08 DIAGNOSIS — R600 Localized edema: Secondary | ICD-10-CM

## 2022-10-08 DIAGNOSIS — I10 Essential (primary) hypertension: Secondary | ICD-10-CM

## 2022-10-08 DIAGNOSIS — E1169 Type 2 diabetes mellitus with other specified complication: Secondary | ICD-10-CM

## 2022-10-09 NOTE — Telephone Encounter (Signed)
Not on current med list.

## 2022-10-27 DIAGNOSIS — Z6832 Body mass index (BMI) 32.0-32.9, adult: Secondary | ICD-10-CM | POA: Diagnosis not present

## 2022-10-27 DIAGNOSIS — M5416 Radiculopathy, lumbar region: Secondary | ICD-10-CM | POA: Diagnosis not present

## 2022-10-30 ENCOUNTER — Telehealth: Payer: Self-pay | Admitting: *Deleted

## 2022-10-30 NOTE — Telephone Encounter (Signed)
Pt called back though I was unavailable to take the call. I returned the call to the pt on [phone], which the call went through this time. Pt is agreeable to tele pre op appt 11/07/22 @ 10:20. Med rec and consent are done.      Patient Consent for Virtual Visit        [name] has provided verbal consent on 10/30/2022 for a virtual visit (video or telephone).   CONSENT FOR VIRTUAL VISIT FOR:  [name]  By participating in this virtual visit I agree to the following:  I hereby voluntarily request, consent and authorize [facility] and its employed or contracted physicians, physician assistants, nurse practitioners or other licensed health care professionals (the Practitioner), to provide me with telemedicine health care services (the "Services") as deemed necessary by the treating Practitioner. I acknowledge and consent to receive the Services by the Practitioner via telemedicine. I understand that the telemedicine visit will involve communicating with the Practitioner through live audiovisual communication technology and the disclosure of certain medical information by electronic transmission. I acknowledge that I have been given the opportunity to request an in-person assessment or other available alternative prior to the telemedicine visit and am voluntarily participating in the telemedicine visit.  I understand that I have the right to withhold or withdraw my consent to the use of telemedicine in the course of my care at any time, without affecting my right to future care or treatment, and that the Practitioner or I may terminate the telemedicine visit at any time. I understand that I have the right to inspect all information obtained and/or recorded in the course of the telemedicine visit and may receive copies of available information for a reasonable fee.  I understand that some of the potential risks of receiving the Services via telemedicine include:  Delay or  interruption in medical evaluation due to technological equipment failure or disruption; Information transmitted may not be sufficient (e.g. poor resolution of images) to allow for appropriate medical decision making by the Practitioner; and/or  In rare instances, security protocols could fail, causing a breach of personal health information.  Furthermore, I acknowledge that it is my responsibility to provide information about my medical history, conditions and care that is complete and accurate to the best of my ability. I acknowledge that Practitioner's advice, recommendations, and/or decision may be based on factors not within their control, such as incomplete or inaccurate data provided by me or distortions of diagnostic images or specimens that may result from electronic transmissions. I understand that the practice of medicine is not an exact science and that Practitioner makes no warranties or guarantees regarding treatment outcomes. I acknowledge that a copy of this consent can be made available to me via my patient portal [facility] MyChart), or I can request a printed copy by calling the office of [facility].    I understand that my insurance will be billed for this visit.   I have read or had this consent read to me. I understand the contents of this consent, which adequately explains the benefits and risks of the Services being provided via telemedicine.  I have been provided ample opportunity to ask questions regarding this consent and the Services and have had my questions answered to my satisfaction. I give my informed consent for the services to be provided through the use of telemedicine in my medical care   .

## 2022-10-30 NOTE — Telephone Encounter (Signed)
   Pre-operative Risk Assessment    Patient Name: [name]  DOB: 05-Jul-1945 MRN: [id number]      Request for Surgical Clearance    Procedure:   LUMBAR MICRODISCECTOMY  Date of Surgery:  Clearance TBD                                 Surgeon:  DR. Hoyt Koch Surgeon's Group or Practice Name:  [facility] Phone number:  [phone] EXT 221 NIKKI Fax number:  [phone] ATTN: [id number]   Type of Clearance Requested:   - Medical ; ASA    Type of Anesthesia:  General    Additional requests/questions:    [name]   10/30/2022, 10:24 AM

## 2022-10-30 NOTE — Telephone Encounter (Signed)
Patient returned call

## 2022-10-30 NOTE — Telephone Encounter (Signed)
Tried to call the pt, though recording said # not in service. I then called DPR pt's daughter [name]. I left message to call back as we are needing to schedule the pt a tele pre op appt.

## 2022-10-30 NOTE — Telephone Encounter (Signed)
Pt called back though I was unavailable to take the call. I returned the call to the pt on [phone], which the call went through this time. Pt is agreeable to tele pre op appt 11/07/22 @ 10:20. Med rec and consent are done.

## 2022-10-30 NOTE — Telephone Encounter (Addendum)
   Name: [name]  DOB: [birth year]  MRN: [id number]  Primary Cardiologist: Nanetta Batty, MD   Preoperative team, please contact this patient and set up a phone call appointment for further preoperative risk assessment. Please obtain consent and complete medication review. Thank you for your help.  I confirm that guidance regarding antiplatelet and oral anticoagulation therapy has been completed and, if necessary, noted below.  Patient my hold ASA for Lumbar Microdiskectomy   [facility]

## 2022-11-02 ENCOUNTER — Inpatient Hospital Stay: Payer: Medicare HMO | Attending: Hematology and Oncology | Admitting: Hematology and Oncology

## 2022-11-02 NOTE — Assessment & Plan Note (Deleted)
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  Breast cancer surveillance: 1.  Breast exam 11/02/2022 benign.   2.  Mammogram 11/04/2021: Benign breast density category C 3.  CT angiography chest abdomen pelvis: Small bilateral pleural effusions, cholelithiasis, sigmoid diverticulosis 4.  Ultrasound gallbladder 09/19/2021: Cholelithiasis without cholecystitis, fatty liver status postcholecystectomy July 2023 5.  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   Return to clinic on an as-needed basis.

## 2022-11-07 ENCOUNTER — Ambulatory Visit: Payer: Medicare HMO | Attending: Cardiology

## 2022-11-07 DIAGNOSIS — Z01810 Encounter for preprocedural cardiovascular examination: Secondary | ICD-10-CM | POA: Diagnosis not present

## 2022-11-07 NOTE — Progress Notes (Signed)
Virtual Visit via Telephone Note   Because of [name]'s co-morbid illnesses, she is at least at moderate risk for complications without adequate follow up.  This format is felt to be most appropriate for this patient at this time.  The patient did not have access to video technology/had technical difficulties with video requiring transitioning to audio format only (telephone).  All issues noted in this document were discussed and addressed.  No physical exam could be performed with this format.  Please refer to the patient's chart for her consent to telehealth for [facility].  Evaluation Performed:  Preoperative cardiovascular risk assessment _____________   Date:  11/07/2022   Patient ID:  [name], DOB 09-Mar-1946, MRN [id number] Patient Location:  Home Provider location:   Office  Primary Care Provider:  Early, Sung Amabile, NP Primary Cardiologist:  Nanetta Batty, MD  Chief Complaint / Patient Profile   77 y.o. y/o female with a h/o HFpEF, HTN, HLD who is pending lumbar microdiscectomy and presents today for telephonic preoperative cardiovascular risk assessment.  History of Present Illness    [name] is a 77 y.o. female who presents via audio/video conferencing for a telehealth visit today.  Pt was last seen in cardiology clinic on 06/20/2022 by Dr Allyson Sabal.  At that time [name] was doing well with no complaints of chest pain or interval ED visits. The patient is now pending procedure as outlined above. Since her last visit, she has been doing well from a cardiac perspective.  She is still maintaining activity but is limited due to her lower back and pinched nerve.  She denies chest pain, shortness of breath, lower extremity edema, fatigue, palpitations, melena, hematuria, hemoptysis, diaphoresis, weakness, presyncope, syncope, orthopnea, and PND.   Patient can hold aspirin 7 days prior to procedure  Past Medical History    Past Medical  History:  Diagnosis Date   Abnormal bone density screening    Arthritis    Cancer (HCC) 07/2017   left breast cancer   Complication of anesthesia    "i wake up"   Diverticulosis    DM (diabetes mellitus) (HCC)    Family history of colon cancer    Family history of ovarian cancer    Family history of pancreatic cancer    Family history of prostate cancer    Family history of stomach cancer    GERD 08/11/2008   Qualifier: Diagnosis of  By: Clent Ridges MD, Tera Mater    GERD (gastroesophageal reflux disease)    History of radiation therapy 07/10/17- 08/06/17   40.05 Gy directed to the left breast in 15 fractions, followed by a boost of 10 Gy in 5 fractions.    HTN (hypertension)    Hyperlipidemia    Hypertriglyceridemia 04/13/2019   Osteoporosis 06/15/2017   Personal history of radiation therapy    Poor compliance    Uncontrolled hypertension 03/20/2019   Vitamin D deficiency    Past Surgical History:  Procedure Laterality Date   APPENDECTOMY     BREAST BIOPSY Left 04/24/2017   malignant   BREAST BIOPSY Left 04/24/2017   benign   BREAST BIOPSY Left 04/25/2017   benign   BREAST BIOPSY Left 04/2018   fibroadenoma w/ calcs   BREAST LUMPECTOMY Left 05/30/2017   BREAST LUMPECTOMY WITH RADIOACTIVE SEED LOCALIZATION Left 05/30/2017   Procedure: LEFT BREAST LUMPECTOMY WITH RADIOACTIVE SEED LOCALIZATION;  Surgeon: Harriette Bouillon, MD;  Location: [facility] OR;  Service: General;  Laterality: Left;  COLONOSCOPY  06/12/2005   Schooler/normal exam="i woke up during it"   DILATION AND CURETTAGE OF UTERUS     FOREIGN BODY REMOVAL ESOPHAGEAL     TONSILLECTOMY     TUBAL LIGATION      Allergies  Allergies  Allergen Reactions   Atorvastatin Other (See Comments)    dizziness   Fosamax [Alendronate Sodium]     Cough,trouble shallowing, chest pain   Lisinopril Cough   Tetracycline Nausea And Vomiting    Home Medications    Prior to Admission medications   Medication Sig Start Date End Date Taking?  Authorizing Provider  Accu-Chek Softclix Lancets lancets 1 each by Other route 2 (two) times daily. Used to check blood sugars twice daily. DX code E11.9.    [provider]  amLODipine (NORVASC) 10 MG tablet TAKE 1 TABLET BY MOUTH EVERY DAY 07/21/21   Runell Gess, MD  aspirin EC 81 MG tablet Take 81 mg by mouth daily.    [provider]  Blood Glucose Monitoring Suppl (ACCU-CHEK AVIVA PLUS) w/Device KIT USE AS DIRECTED 2 TIMES DAILY 03/20/17   Henson, [name] L, NP-C  calcium-vitamin D (OSCAL WITH D) 250-125 MG-UNIT tablet Take 1 tablet by mouth daily.    [provider]  carvedilol (COREG) 25 MG tablet Take 1 tablet (25 mg total) by mouth 2 (two) times daily. 06/20/22   Runell Gess, MD  furosemide (LASIX) 40 MG tablet Take 1 tablet (40 mg total) by mouth daily. 09/23/21   Joylene Grapes, NP  gabapentin (NEURONTIN) 300 MG capsule Take 300 mg by mouth 3 (three) times daily. 10/08/22   [provider]  glucose blood test strip Used to check blood sugars twice daily. DX code E11.9. 08/10/17   Romero Belling, MD  HYDROcodone-acetaminophen (NORCO/VICODIN) 5-325 MG tablet Take 1-2 tablets by mouth every 6 (six) hours as needed for moderate pain. 08/02/22   Tollie Eth, NP  hyoscyamine (LEVSIN) 0.125 MG tablet Take by mouth 3 (three) times daily as needed. 09/23/21   [provider]  KLOR-CON M20 20 MEQ tablet TAKE 1 TABLET BY MOUTH EVERY DAY 10/09/22   Runell Gess, MD  losartan-hydrochlorothiazide [facility]) 100-12.5 MG tablet TAKE 1 TABLET BY MOUTH EVERY DAY 10/09/22   Early, Sung Amabile, NP  meloxicam (MOBIC) 15 MG tablet Take 15 mg by mouth daily. 09/13/22   [provider]  metFORMIN (GLUCOPHAGE-XR) 500 MG 24 hr tablet Take 4 tablets (2,000 mg total) by mouth daily with breakfast. Patient taking differently: Take 1,000 mg by mouth daily with breakfast. 12/15/20   Romero Belling, MD  ondansetron (ZOFRAN-ODT) 4 MG disintegrating tablet Take 4 mg by mouth  every 6 (six) hours as needed. Patient not taking: Reported on 03/15/2022 09/23/21   [provider]  pravastatin (PRAVACHOL) 20 MG tablet TAKE 1 TABLET BY MOUTH EVERY DAY 10/09/22   Early, Sung Amabile, NP  PROLIA 60 MG/ML SOLN injection  07/04/17   [provider]  tamoxifen (NOLVADEX) 20 MG tablet TAKE 1 TABLET BY MOUTH EVERY DAY 10/09/22   Serena Croissant, MD  vitamin C (ASCORBIC ACID) 500 MG tablet Take 500 mg by mouth daily.    [provider]    Physical Exam    Vital Signs:  [name] does not have vital signs available for review today.  Given telephonic nature of communication, physical exam is limited. AAOx3. NAD. Normal affect.  Speech and respirations are unlabored.  Accessory Clinical Findings  None  Assessment & Plan    1.  Preoperative Cardiovascular Risk Assessment:  -Patient's RCRI score is 6.6%  The patient affirms she has been doing well without any new cardiac symptoms. They are able to achieve 5 METS without cardiac limitations. Therefore, based on ACC/AHA guidelines, the patient would be at acceptable risk for the planned procedure without further cardiovascular testing. The patient was advised that if she develops new symptoms prior to surgery to contact our office to arrange for a follow-up visit, and she verbalized understanding.   The patient was advised that if she develops new symptoms prior to surgery to contact our office to arrange for a follow-up visit, and she verbalized understanding.  Patient can hold aspirin 7 days prior to procedure and should restart postprocedure when surgically safe.  A copy of this note will be routed to requesting surgeon.  Time:   Today, I have spent 6 minutes with the patient with telehealth technology discussing medical history, symptoms, and management plan.     Napoleon Form, Leodis Rains, NP  11/07/2022, 7:09 AM

## 2022-11-27 ENCOUNTER — Other Ambulatory Visit: Payer: Self-pay | Admitting: Neurosurgery

## 2022-11-28 ENCOUNTER — Other Ambulatory Visit: Payer: Self-pay | Admitting: Nurse Practitioner

## 2022-11-28 DIAGNOSIS — I5189 Other ill-defined heart diseases: Secondary | ICD-10-CM

## 2022-11-28 DIAGNOSIS — E1169 Type 2 diabetes mellitus with other specified complication: Secondary | ICD-10-CM

## 2022-11-28 DIAGNOSIS — R600 Localized edema: Secondary | ICD-10-CM

## 2022-11-28 DIAGNOSIS — I10 Essential (primary) hypertension: Secondary | ICD-10-CM

## 2022-11-29 MED ORDER — FUROSEMIDE 40 MG PO TABS
40.0000 mg | ORAL_TABLET | Freq: Every day | ORAL | 3 refills | Status: AC
Start: 2022-11-29 — End: ?

## 2022-11-29 NOTE — Progress Notes (Signed)
Surgical Instructions   Your procedure is scheduled on Tuesday December 12, 2022. Report to [facility] Main Entrance "A" at 7:15 A.M., then check in with the Admitting office. Any questions or running late day of surgery: call [phone]  Questions prior to your surgery date: call [phone], Monday-Friday, 8am-4pm. If you experience any cold or flu symptoms such as cough, fever, chills, shortness of breath, etc. between now and your scheduled surgery, please notify us at the above number.     Remember:  Do not eat after midnight the night before your surgery  You may drink clear liquids until 6:15 the morning of your surgery.   Clear liquids allowed are: Water, Non-Citrus Juices (without pulp), Carbonated Beverages, Clear Tea, Black Coffee Only (NO MILK, CREAM OR POWDERED CREAMER of any kind), and Gatorade.    Take these medicines the morning of surgery with A SIP OF WATER  amLODipine (NORVASC)  carvedilol (COREG)  gabapentin (NEURONTIN)    May take these medicines IF NEEDED: HYDROcodone-acetaminophen (NORCO/VICODIN)  ondansetron (ZOFRAN-ODT)    Follow your Cardiologist's instructions and stop your Aspirin seven days prior to surgery, with the last day being 12/04/2022.      One week prior to surgery, STOP taking any Aleve, Naproxen, Ibuprofen, Motrin, Advil, Goody's, BC's, all herbal medications, fish oil, and non-prescription vitamins.  This includes your meloxicam (MOBIC) .                WHAT DO I DO ABOUT MY DIABETES MEDICATION?   Do not take oral diabetes medicines (pills) the morning of surgery. DO NOT TAKE YOUR metFORMIN (GLUCOPHAGE-XR) THE MORNING OF YOUR SURGERY.  The day of surgery, do not take other diabetes injectables, including Byetta (exenatide), Bydureon (exenatide ER), Victoza (liraglutide), or Trulicity (dulaglutide).  If your CBG is greater than 220 mg/dL, you may take  of your sliding scale (correction) dose of insulin.   HOW TO MANAGE YOUR  DIABETES BEFORE AND AFTER SURGERY  Why is it important to control my blood sugar before and after surgery? Improving blood sugar levels before and after surgery helps healing and can limit problems. A way of improving blood sugar control is eating a healthy diet by:  Eating less sugar and carbohydrates  Increasing activity/exercise  Talking with your doctor about reaching your blood sugar goals High blood sugars (greater than 180 mg/dL) can raise your risk of infections and slow your recovery, so you will need to focus on controlling your diabetes during the weeks before surgery. Make sure that the doctor who takes care of your diabetes knows about your planned surgery including the date and location.  How do I manage my blood sugar before surgery? Check your blood sugar at least 4 times a day, starting 2 days before surgery, to make sure that the level is not too high or low.  Check your blood sugar the morning of your surgery when you wake up and every 2 hours until you get to the Short Stay unit.  If your blood sugar is less than 70 mg/dL, you will need to treat for low blood sugar: Do not take insulin. Treat a low blood sugar (less than 70 mg/dL) with  cup of clear juice (cranberry or apple), 4 glucose tablets, OR glucose gel. Recheck blood sugar in 15 minutes after treatment (to make sure it is greater than 70 mg/dL). If your blood sugar is not greater than 70 mg/dL on recheck, call [phone] for further instructions. Report your blood sugar to the  short stay nurse when you get to Short Stay.  If you are admitted to the hospital after surgery: Your blood sugar will be checked by the staff and you will probably be given insulin after surgery (instead of oral diabetes medicines) to make sure you have good blood sugar levels. The goal for blood sugar control after surgery is 80-180 mg/dL.            Do NOT Smoke (Tobacco/Vaping) for 24 hours prior to your procedure.  If you use  a CPAP at night, you may bring your mask/headgear for your overnight stay.   You will be asked to remove any contacts, glasses, piercing's, hearing aid's, dentures/partials prior to surgery. Please bring cases for these items if needed.    Patients discharged the day of surgery will not be allowed to drive home, and someone needs to stay with them for 24 hours.  SURGICAL WAITING ROOM VISITATION Patients may have no more than 2 support people in the waiting area - these visitors may rotate.   Pre-op nurse will coordinate an appropriate time for 1 ADULT support person, who may not rotate, to accompany patient in pre-op.  Children under the age of 39 must have an adult with them who is not the patient and must remain in the main waiting area with an adult.  If the patient needs to stay at the hospital during part of their recovery, the visitor guidelines for inpatient rooms apply.  Please refer to the [facility] website for the visitor guidelines for any additional information.   If you received a COVID test during your pre-op visit  it is requested that you wear a mask when out in public, stay away from anyone that may not be feeling well and notify your surgeon if you develop symptoms. If you have been in contact with anyone that has tested positive in the last 10 days please notify you surgeon.      Pre-operative 5 CHG Bathing Instructions   You can play a key role in reducing the risk of infection after surgery. Your skin needs to be as free of germs as possible. You can reduce the number of germs on your skin by washing with CHG (chlorhexidine gluconate) soap before surgery. CHG is an antiseptic soap that kills germs and continues to kill germs even after washing.   DO NOT use if you have an allergy to chlorhexidine/CHG or antibacterial soaps. If your skin becomes reddened or irritated, stop using the CHG and notify one of our RNs at [phone].   Please shower with the CHG soap  starting 4 days before surgery using the following schedule:     Please keep in mind the following:  DO NOT shave, including legs and underarms, starting the day of your first shower.   You may shave your face at any point before/day of surgery.  Place clean sheets on your bed the day you start using CHG soap. Use a clean washcloth (not used since being washed) for each shower. DO NOT sleep with pets once you start using the CHG.   CHG Shower Instructions:  If you choose to wash your hair and private area, wash first with your normal shampoo/soap.  After you use shampoo/soap, rinse your hair and body thoroughly to remove shampoo/soap residue.  Turn the water OFF and apply about 3 tablespoons (45 ml) of CHG soap to a CLEAN washcloth.  Apply CHG soap ONLY FROM YOUR NECK DOWN TO YOUR TOES (washing for  3-5 minutes)  DO NOT use CHG soap on face, private areas, open wounds, or sores.  Pay special attention to the area where your surgery is being performed.  If you are having back surgery, having someone wash your back for you may be helpful. Wait 2 minutes after CHG soap is applied, then you may rinse off the CHG soap.  Pat dry with a clean towel  Put on clean clothes/pajamas   If you choose to wear lotion, please use ONLY the CHG-compatible lotions on the back of this paper.   Additional instructions for the day of surgery: DO NOT APPLY any lotions, deodorants or perfumes.   Do not bring valuables to the hospital. [facility] is not responsible for any belongings/valuables. Do not wear nail polish, gel polish, artificial nails, or any other type of covering on natural nails (fingers and toes) Do not wear jewelry or makeup Put on clean/comfortable clothes.  Please brush your teeth.  Ask your nurse before applying any prescription medications to the skin.     CHG Compatible Lotions   Aveeno Moisturizing lotion  Cetaphil Moisturizing Cream  Cetaphil Moisturizing Lotion  Clairol  Herbal Essence Moisturizing Lotion, Dry Skin  Clairol Herbal Essence Moisturizing Lotion, Extra Dry Skin  Clairol Herbal Essence Moisturizing Lotion, Normal Skin  Curel Age Defying Therapeutic Moisturizing Lotion with Alpha Hydroxy  Curel Extreme Care Body Lotion  Curel Soothing Hands Moisturizing Hand Lotion  Curel Therapeutic Moisturizing Cream, Fragrance-Free  Curel Therapeutic Moisturizing Lotion, Fragrance-Free  Curel Therapeutic Moisturizing Lotion, Original Formula  Eucerin Daily Replenishing Lotion  Eucerin Dry Skin Therapy Plus Alpha Hydroxy Crme  Eucerin Dry Skin Therapy Plus Alpha Hydroxy Lotion  Eucerin Original Crme  Eucerin Original Lotion  Eucerin Plus Crme Eucerin Plus Lotion  Eucerin TriLipid Replenishing Lotion  Keri Anti-Bacterial Hand Lotion  Keri Deep Conditioning Original Lotion Dry Skin Formula Softly Scented  Keri Deep Conditioning Original Lotion, Fragrance Free Sensitive Skin Formula  Keri Lotion Fast Absorbing Fragrance Free Sensitive Skin Formula  Keri Lotion Fast Absorbing Softly Scented Dry Skin Formula  Keri Original Lotion  Keri Skin Renewal Lotion Keri Silky Smooth Lotion  Keri Silky Smooth Sensitive Skin Lotion  Nivea Body Creamy Conditioning Oil  Nivea Body Extra Enriched Lotion  Nivea Body Original Lotion  Nivea Body Sheer Moisturizing Lotion Nivea Crme  Nivea Skin Firming Lotion  NutraDerm 30 Skin Lotion  NutraDerm Skin Lotion  NutraDerm Therapeutic Skin Cream  NutraDerm Therapeutic Skin Lotion  ProShield Protective Hand Cream  Provon moisturizing lotion  Please read over the following fact sheets that you were given.

## 2022-11-30 ENCOUNTER — Encounter (HOSPITAL_COMMUNITY): Payer: Self-pay | Admitting: Vascular Surgery

## 2022-11-30 ENCOUNTER — Encounter (HOSPITAL_COMMUNITY): Payer: Self-pay

## 2022-11-30 ENCOUNTER — Encounter (HOSPITAL_COMMUNITY)
Admission: RE | Admit: 2022-11-30 | Discharge: 2022-11-30 | Disposition: A | Discharge status: Home / Self Care | Payer: Medicare HMO | Source: Ambulatory Visit | Attending: Neurosurgery | Admitting: Neurosurgery

## 2022-11-30 ENCOUNTER — Other Ambulatory Visit: Payer: Self-pay

## 2022-11-30 VITALS — BP 155/65 | HR 66 | Temp 98.6°F | Resp 18 | Ht 66.0 in | Wt 193.6 lb

## 2022-11-30 DIAGNOSIS — R011 Cardiac murmur, unspecified: Secondary | ICD-10-CM | POA: Insufficient documentation

## 2022-11-30 DIAGNOSIS — I5032 Chronic diastolic (congestive) heart failure: Secondary | ICD-10-CM | POA: Diagnosis not present

## 2022-11-30 DIAGNOSIS — I110 Hypertensive heart disease with heart failure: Secondary | ICD-10-CM | POA: Insufficient documentation

## 2022-11-30 DIAGNOSIS — Z853 Personal history of malignant neoplasm of breast: Secondary | ICD-10-CM | POA: Diagnosis not present

## 2022-11-30 DIAGNOSIS — I3481 Nonrheumatic mitral (valve) annulus calcification: Secondary | ICD-10-CM | POA: Diagnosis not present

## 2022-11-30 DIAGNOSIS — Z01812 Encounter for preprocedural laboratory examination: Secondary | ICD-10-CM | POA: Diagnosis present

## 2022-11-30 DIAGNOSIS — M545 Low back pain, unspecified: Secondary | ICD-10-CM | POA: Diagnosis not present

## 2022-11-30 DIAGNOSIS — E781 Pure hyperglyceridemia: Secondary | ICD-10-CM | POA: Diagnosis not present

## 2022-11-30 DIAGNOSIS — K219 Gastro-esophageal reflux disease without esophagitis: Secondary | ICD-10-CM | POA: Insufficient documentation

## 2022-11-30 DIAGNOSIS — Z01818 Encounter for other preprocedural examination: Secondary | ICD-10-CM

## 2022-11-30 DIAGNOSIS — E1165 Type 2 diabetes mellitus with hyperglycemia: Secondary | ICD-10-CM | POA: Diagnosis not present

## 2022-11-30 HISTORY — DX: Cardiac murmur, unspecified: R01.1

## 2022-11-30 HISTORY — DX: Hyperlipidemia, unspecified: E78.5

## 2022-11-30 HISTORY — DX: Unspecified diastolic (congestive) heart failure: I50.30

## 2022-11-30 LAB — HEMOGLOBIN A1C
Hgb A1c MFr Bld: 14 % — ABNORMAL HIGH (ref 4.8–5.6)
Mean Plasma Glucose: 355.1 mg/dL

## 2022-11-30 LAB — CBC
HCT: 34.4 % — ABNORMAL LOW (ref 36.0–46.0)
Hemoglobin: 10.9 g/dL — ABNORMAL LOW (ref 12.0–15.0)
MCH: 25.6 pg — ABNORMAL LOW (ref 26.0–34.0)
MCHC: 31.7 g/dL (ref 30.0–36.0)
MCV: 80.8 fL (ref 80.0–100.0)
Platelets: 336 10*3/uL (ref 150–400)
RBC: 4.26 MIL/uL (ref 3.87–5.11)
RDW: 14.7 % (ref 11.5–15.5)
WBC: 12.2 10*3/uL — ABNORMAL HIGH (ref 4.0–10.5)
nRBC: 0 % (ref 0.0–0.2)

## 2022-11-30 LAB — BASIC METABOLIC PANEL
Anion gap: 10 (ref 5–15)
BUN: 23 mg/dL (ref 8–23)
CO2: 24 mmol/L (ref 22–32)
Calcium: 9 mg/dL (ref 8.9–10.3)
Chloride: 100 mmol/L (ref 98–111)
Creatinine, Ser: 1.23 mg/dL — ABNORMAL HIGH (ref 0.44–1.00)
GFR, Estimated: 46 mL/min — ABNORMAL LOW (ref >60)
Glucose, Bld: 330 mg/dL — ABNORMAL HIGH (ref 70–99)
Potassium: 4.2 mmol/L (ref 3.5–5.1)
Sodium: 134 mmol/L — ABNORMAL LOW (ref 135–145)

## 2022-11-30 LAB — SURGICAL PCR SCREEN
MRSA, PCR: NEGATIVE
Staphylococcus aureus: NEGATIVE

## 2022-11-30 LAB — GLUCOSE, CAPILLARY: Glucose-Capillary: 344 mg/dL — ABNORMAL HIGH (ref 70–99)

## 2022-11-30 NOTE — Progress Notes (Signed)
PCP - Enid Skeens, NP Cardiologist - Dr. Nanetta Batty  PPM/ICD - denies   Chest x-ray - 09/18/21 EKG - 06/20/22 Stress Test - 11/19/07 ECHO - 10/05/21 Cardiac Cath - denies  Sleep Study - denies   DM: Pt does not check CBG at home and she does not now what her typical fasting levels are  Last dose of GLP1 agonist-  n/a   Blood Thinner Instructions: n/a Aspirin Instructions: Hold 7 days. Last dose 8/26  ERAS Protcol - no, NPO   COVID TEST- n/a   Anesthesia review: yes. Cardiac hx. Pt's CBG was 334 at PAT visit. She said she last ate oatmeal at 1030 this morning. She did take her metformin.  Pt states that she used to see Dr. Romero Belling until he "left the practice" in 2022. She states that she has not seen an endocrinologist since then. She said she is newly seeing Enid Skeens, NP and has an appt for a physical with her on 11/1.  Patient denies shortness of breath, fever, cough and chest pain at PAT appointment   All instructions explained to the patient, with a verbal understanding of the material. Patient agrees to go over the instructions while at home for a better understanding. The opportunity to ask questions was provided.

## 2022-11-30 NOTE — Progress Notes (Signed)
Surgical Instructions   Your procedure is scheduled on Tuesday December 12, 2022. Report to [facility] Main Entrance "A" at 7:15 A.M., then check in with the Admitting office. Any questions or running late day of surgery: call [phone]  Questions prior to your surgery date: call [phone], Monday-Friday, 8am-4pm. If you experience any cold or flu symptoms such as cough, fever, chills, shortness of breath, etc. between now and your scheduled surgery, please notify us at the above number.     Remember:  Do not eat or drink after midnight the night before your surgery    Take these medicines the morning of surgery with A SIP OF WATER  amLODipine (NORVASC)  carvedilol (COREG)  gabapentin (NEURONTIN)    May take these medicines IF NEEDED: HYDROcodone-acetaminophen (NORCO/VICODIN)  ondansetron (ZOFRAN-ODT)    Follow your Cardiologist's instructions and stop your Aspirin seven days prior to surgery, with the last day being 12/04/2022.      One week prior to surgery, STOP taking any Aleve, Naproxen, Ibuprofen, Motrin, Advil, Goody's, BC's, all herbal medications, fish oil, and non-prescription vitamins.  This includes your meloxicam (MOBIC) .                WHAT DO I DO ABOUT MY DIABETES MEDICATION?   Do not take oral diabetes medicines (pills) the morning of surgery. DO NOT TAKE YOUR metFORMIN (GLUCOPHAGE-XR) THE MORNING OF YOUR SURGERY.  The day of surgery, do not take other diabetes injectables, including Byetta (exenatide), Bydureon (exenatide ER), Victoza (liraglutide), or Trulicity (dulaglutide).  If your CBG is greater than 220 mg/dL, you may take  of your sliding scale (correction) dose of insulin.   HOW TO MANAGE YOUR DIABETES BEFORE AND AFTER SURGERY  Why is it important to control my blood sugar before and after surgery? Improving blood sugar levels before and after surgery helps healing and can limit problems. A way of improving blood sugar control is eating  a healthy diet by:  Eating less sugar and carbohydrates  Increasing activity/exercise  Talking with your doctor about reaching your blood sugar goals High blood sugars (greater than 180 mg/dL) can raise your risk of infections and slow your recovery, so you will need to focus on controlling your diabetes during the weeks before surgery. Make sure that the doctor who takes care of your diabetes knows about your planned surgery including the date and location.  How do I manage my blood sugar before surgery? Check your blood sugar at least 4 times a day, starting 2 days before surgery, to make sure that the level is not too high or low.  Check your blood sugar the morning of your surgery when you wake up and every 2 hours until you get to the Short Stay unit.  If your blood sugar is less than 70 mg/dL, you will need to treat for low blood sugar: Do not take insulin. Treat a low blood sugar (less than 70 mg/dL) with  cup of clear juice (cranberry or apple), 4 glucose tablets, OR glucose gel. Recheck blood sugar in 15 minutes after treatment (to make sure it is greater than 70 mg/dL). If your blood sugar is not greater than 70 mg/dL on recheck, call [phone] for further instructions. Report your blood sugar to the short stay nurse when you get to Short Stay.  If you are admitted to the hospital after surgery: Your blood sugar will be checked by the staff and you will probably be given insulin after surgery (instead of oral  diabetes medicines) to make sure you have good blood sugar levels. The goal for blood sugar control after surgery is 80-180 mg/dL.            Do NOT Smoke (Tobacco/Vaping) for 24 hours prior to your procedure.  If you use a CPAP at night, you may bring your mask/headgear for your overnight stay.   You will be asked to remove any contacts, glasses, piercing's, hearing aid's, dentures/partials prior to surgery. Please bring cases for these items if needed.    Patients  discharged the day of surgery will not be allowed to drive home, and someone needs to stay with them for 24 hours.  SURGICAL WAITING ROOM VISITATION Patients may have no more than 2 support people in the waiting area - these visitors may rotate.   Pre-op nurse will coordinate an appropriate time for 1 ADULT support person, who may not rotate, to accompany patient in pre-op.  Children under the age of 31 must have an adult with them who is not the patient and must remain in the main waiting area with an adult.  If the patient needs to stay at the hospital during part of their recovery, the visitor guidelines for inpatient rooms apply.  Please refer to the [facility] website for the visitor guidelines for any additional information.   If you received a COVID test during your pre-op visit  it is requested that you wear a mask when out in public, stay away from anyone that may not be feeling well and notify your surgeon if you develop symptoms. If you have been in contact with anyone that has tested positive in the last 10 days please notify you surgeon.      Pre-operative 5 CHG Bathing Instructions   You can play a key role in reducing the risk of infection after surgery. Your skin needs to be as free of germs as possible. You can reduce the number of germs on your skin by washing with CHG (chlorhexidine gluconate) soap before surgery. CHG is an antiseptic soap that kills germs and continues to kill germs even after washing.   DO NOT use if you have an allergy to chlorhexidine/CHG or antibacterial soaps. If your skin becomes reddened or irritated, stop using the CHG and notify one of our RNs at [phone].   Please shower with the CHG soap starting 4 days before surgery using the following schedule:     Please keep in mind the following:  DO NOT shave, including legs and underarms, starting the day of your first shower.   You may shave your face at any point before/day of surgery.   Place clean sheets on your bed the day you start using CHG soap. Use a clean washcloth (not used since being washed) for each shower. DO NOT sleep with pets once you start using the CHG.   CHG Shower Instructions:  If you choose to wash your hair and private area, wash first with your normal shampoo/soap.  After you use shampoo/soap, rinse your hair and body thoroughly to remove shampoo/soap residue.  Turn the water OFF and apply about 3 tablespoons (45 ml) of CHG soap to a CLEAN washcloth.  Apply CHG soap ONLY FROM YOUR NECK DOWN TO YOUR TOES (washing for 3-5 minutes)  DO NOT use CHG soap on face, private areas, open wounds, or sores.  Pay special attention to the area where your surgery is being performed.  If you are having back surgery, having someone wash your  back for you may be helpful. Wait 2 minutes after CHG soap is applied, then you may rinse off the CHG soap.  Pat dry with a clean towel  Put on clean clothes/pajamas   If you choose to wear lotion, please use ONLY the CHG-compatible lotions on the back of this paper.   Additional instructions for the day of surgery: DO NOT APPLY any lotions, deodorants or perfumes.   Do not bring valuables to the hospital. [facility] is not responsible for any belongings/valuables. Do not wear nail polish, gel polish, artificial nails, or any other type of covering on natural nails (fingers and toes) Do not wear jewelry or makeup Put on clean/comfortable clothes.  Please brush your teeth.  Ask your nurse before applying any prescription medications to the skin.     CHG Compatible Lotions   Aveeno Moisturizing lotion  Cetaphil Moisturizing Cream  Cetaphil Moisturizing Lotion  Clairol Herbal Essence Moisturizing Lotion, Dry Skin  Clairol Herbal Essence Moisturizing Lotion, Extra Dry Skin  Clairol Herbal Essence Moisturizing Lotion, Normal Skin  Curel Age Defying Therapeutic Moisturizing Lotion with Alpha Hydroxy  Curel Extreme Care  Body Lotion  Curel Soothing Hands Moisturizing Hand Lotion  Curel Therapeutic Moisturizing Cream, Fragrance-Free  Curel Therapeutic Moisturizing Lotion, Fragrance-Free  Curel Therapeutic Moisturizing Lotion, Original Formula  Eucerin Daily Replenishing Lotion  Eucerin Dry Skin Therapy Plus Alpha Hydroxy Crme  Eucerin Dry Skin Therapy Plus Alpha Hydroxy Lotion  Eucerin Original Crme  Eucerin Original Lotion  Eucerin Plus Crme Eucerin Plus Lotion  Eucerin TriLipid Replenishing Lotion  Keri Anti-Bacterial Hand Lotion  Keri Deep Conditioning Original Lotion Dry Skin Formula Softly Scented  Keri Deep Conditioning Original Lotion, Fragrance Free Sensitive Skin Formula  Keri Lotion Fast Absorbing Fragrance Free Sensitive Skin Formula  Keri Lotion Fast Absorbing Softly Scented Dry Skin Formula  Keri Original Lotion  Keri Skin Renewal Lotion Keri Silky Smooth Lotion  Keri Silky Smooth Sensitive Skin Lotion  Nivea Body Creamy Conditioning Oil  Nivea Body Extra Enriched Lotion  Nivea Body Original Lotion  Nivea Body Sheer Moisturizing Lotion Nivea Crme  Nivea Skin Firming Lotion  NutraDerm 30 Skin Lotion  NutraDerm Skin Lotion  NutraDerm Therapeutic Skin Cream  NutraDerm Therapeutic Skin Lotion  ProShield Protective Hand Cream  Provon moisturizing lotion  Please read over the following fact sheets that you were given.

## 2022-11-30 NOTE — Progress Notes (Signed)
Anesthesia Chart Review:  Case: [id number] Date/Time: 12/12/22 0903   Procedure: MIS MICRODISCECTOMY, L45, METRX (Right) - 3C   Anesthesia type: General   Pre-op diagnosis: LUMBAR BACK PAIN WITH RADICULOPATY AFFECTING RIGHT LOWER EXTREMITY   [facility]: [facility]OM [facility] / [facility]   Surgeons: Bedelia Person, MD       DISCUSSION: Patient is a 77 year old female scheduled for the above procedure.   History includes never smoker, HTN, DM2, HLD, HFpEF, murmur (MAC and AV sclerosis, no significant valvular regurgitation or stenosis 09/2021 echo), GERD, left breast cancer (s/p left breast lumpectomy for DCIS 05/30/17).  She got established with cardiologist Dr. Allyson Sabal on 06/28/17 for  HLD, poorly controlled HTN. Also has previously told she had a murmur.  Echocardiograms have shown normal LVEF, diastolic dysfunction, severe mitral annular calcification and AV sclerosis, but no significant valvular regurgitation or stenosis. She had preoperative cardiology itelephonic evaluation with Neila Gear, NP on 11/07/22 who wrote:  "Patient's RCRI score is 6.6%   The patient affirms she has been doing well without any new cardiac symptoms. They are able to achieve 5 METS without cardiac limitations. Therefore, based on ACC/AHA guidelines, the patient would be at acceptable risk for the planned procedure without further cardiovascular testing. The patient was advised that if she develops new symptoms prior to surgery to contact our office to arrange for a follow-up visit, and she verbalized understanding... Patient can hold aspirin 7 days prior to procedure and should restart postprocedure when surgically safe." Last ASA scheduled for 12/04/22.   CBG at PAT was 334, but otherwise felt in her usual state of health. She reported having a home glucometer but does not check her home CBGs. She had "oatmeal" for breakfast. She last saw endocrinologist Romero Belling, MD on 12/15/20. She was on Rybelsus, but having difficulty  affording, as well as metformin 2000 mg daily. She is currently taking only metformin 1000 mg daily. She considers Early, Sung Amabile, NP her PCP, but only established about six months ago and has been seen for more urgent visits with a comprehensive physical not scheduled until 02/09/23. A1c results as 14% (up from 8.6% on 11/23/21). I have notified Nikki at Dr. Maisie Fus' office and will forward to Huntley Dec Early in hope she can either see patient earlier or make medication adjustments.    VS: BP (!) 155/65   Pulse 66   Temp 37 C (Oral)   Resp 18   Ht 5\' 6"  (1.676 m)   Wt 87.8 kg   SpO2 99%   BMI 31.25 kg/m   PROVIDERS: Early, Sung Amabile, NP is PCP  Nanetta Batty, MD is cardiologist   LABS: See DISCUSSION. (all labs ordered are listed, but only abnormal results are displayed)  Labs Reviewed  GLUCOSE, CAPILLARY - Abnormal; Notable for the following components:      Result Value   Glucose-Capillary 344 (*)    All other components within normal limits  HEMOGLOBIN A1C - Abnormal; Notable for the following components:   Hgb A1c MFr Bld 14.0 (*)    All other components within normal limits  BASIC METABOLIC PANEL - Abnormal; Notable for the following components:   Sodium 134 (*)    Glucose, Bld 330 (*)    Creatinine, Ser 1.23 (*)    GFR, Estimated 46 (*)    All other components within normal limits  CBC - Abnormal; Notable for the following components:   WBC 12.2 (*)    Hemoglobin 10.9 (*)  HCT 34.4 (*)    MCH 25.6 (*)    All other components within normal limits  SURGICAL PCR SCREEN     IMAGES: MRI L-spine 08/20/22: IMPRESSION: 1. At L3-4 there is a broad-based disc osteophyte complex. Moderate bilateral facet arthropathy. Left subarticular recess stenosis with probable impingement of the left intraspinal L4 nerve root. Moderate spinal stenosis. Moderate left foraminal stenosis. No right foraminal stenosis. 2. At L4-5 there is a broad-based disc bulge extending towards the left.  Mild bilateral facet arthropathy. Mild spinal stenosis. Bilateral subarticular recess stenosis. Mild bilateral foraminal stenosis. 3. No acute osseous injury of the lumbar spine.    EKG: 06/20/22: NSR   CV: Echo 10/05/21: IMPRESSIONS   1. Suboptimal lateral E' prime. Left ventricular ejection fraction, by  estimation, is 60 to 65%. The left ventricle has normal function. The left  ventricle has no regional wall motion abnormalities. There is moderate  concentric left ventricular  hypertrophy. Left ventricular diastolic parameters are consistent with  Grade I diastolic dysfunction (impaired relaxation). Elevated left atrial  pressure.   2. Right ventricular systolic function is normal. The right ventricular  size is normal. There is normal pulmonary artery systolic pressure.   3. There is no evidence of cardiac tamponade. Trivial pericardial  effusion that is circumferential.   4. The mitral valve is abnormal. No evidence of mitral valve  regurgitation. No evidence of mitral stenosis. The mean mitral valve  gradient is 2.4 mmHg. Moderate mitral annular calcification.   5. The aortic valve is tricuspid. There is mild thickening of the aortic  valve. Aortic valve regurgitation is not visualized. Aortic valve  sclerosis is present, with no evidence of aortic valve stenosis.   6. The inferior vena cava is normal in size with greater than 50%  respiratory variability, suggesting right atrial pressure of 3 mmHg.  - Comparison(s): No significant change from prior study.    Past Medical History:  Diagnosis Date   (HFpEF) heart failure with preserved ejection fraction (HCC)    Abnormal bone density screening    Arthritis    Cancer (HCC) 07/2017   left breast cancer   Complication of anesthesia    "i wake up"   Diverticulosis    DM (diabetes mellitus) (HCC)    Family history of colon cancer    Family history of ovarian cancer    Family history of pancreatic cancer    Family history  of prostate cancer    Family history of stomach cancer    GERD 08/11/2008   Qualifier: Diagnosis of  By: Clent Ridges MD, Tera Mater    GERD (gastroesophageal reflux disease)    Heart murmur    pt has had an echo   History of radiation therapy 07/10/17- 08/06/17   40.05 Gy directed to the left breast in 15 fractions, followed by a boost of 10 Gy in 5 fractions.    HLD (hyperlipidemia)    HTN (hypertension)    Hyperlipidemia    Hypertriglyceridemia 04/13/2019   Osteoporosis 06/15/2017   Personal history of radiation therapy    Poor compliance    Uncontrolled hypertension 03/20/2019   Vitamin D deficiency     Past Surgical History:  Procedure Laterality Date   APPENDECTOMY     BREAST BIOPSY Left 04/24/2017   malignant   BREAST BIOPSY Left 04/24/2017   benign   BREAST BIOPSY Left 04/25/2017   benign   BREAST BIOPSY Left 04/2018   fibroadenoma w/ calcs   BREAST LUMPECTOMY Left 05/30/2017  BREAST LUMPECTOMY WITH RADIOACTIVE SEED LOCALIZATION Left 05/30/2017   Procedure: LEFT BREAST LUMPECTOMY WITH RADIOACTIVE SEED LOCALIZATION;  Surgeon: Harriette Bouillon, MD;  Location: [facility] OR;  Service: General;  Laterality: Left;   CHOLECYSTECTOMY  09/2021   COLONOSCOPY  06/12/2005   Schooler/normal exam="i woke up during it"   DILATION AND CURETTAGE OF UTERUS     FOREIGN BODY REMOVAL ESOPHAGEAL     TONSILLECTOMY     TUBAL LIGATION      MEDICATIONS:  Accu-Chek Softclix Lancets lancets   amLODipine (NORVASC) 10 MG tablet   aspirin EC 81 MG tablet   Blood Glucose Monitoring Suppl (ACCU-CHEK AVIVA PLUS) w/Device KIT   calcium-vitamin D (OSCAL WITH D) 250-125 MG-UNIT tablet   carvedilol (COREG) 25 MG tablet   furosemide (LASIX) 40 MG tablet   gabapentin (NEURONTIN) 300 MG capsule   glucose blood test strip   HYDROcodone-acetaminophen (NORCO/VICODIN) 5-325 MG tablet   KLOR-CON M20 20 MEQ tablet   losartan-hydrochlorothiazide (HYZAAR) 100-12.5 MG tablet   meloxicam (MOBIC) 15 MG tablet   metFORMIN  (GLUCOPHAGE-XR) 500 MG 24 hr tablet   ondansetron (ZOFRAN-ODT) 4 MG disintegrating tablet   pravastatin (PRAVACHOL) 20 MG tablet   tamoxifen (NOLVADEX) 20 MG tablet   vitamin C (ASCORBIC ACID) 500 MG tablet   No current facility-administered medications for this encounter.    Shonna Chock, PA-C Surgical Short Stay/Anesthesiology [facility] Phone [phone] [facility] Phone [phone] 11/30/2022 5:12 PM

## 2022-12-06 ENCOUNTER — Ambulatory Visit (INDEPENDENT_AMBULATORY_CARE_PROVIDER_SITE_OTHER): Payer: Medicare HMO | Admitting: Medical

## 2022-12-06 VITALS — BP 122/80 | HR 69 | Wt 192.6 lb

## 2022-12-06 DIAGNOSIS — I5032 Chronic diastolic (congestive) heart failure: Secondary | ICD-10-CM

## 2022-12-06 DIAGNOSIS — E1165 Type 2 diabetes mellitus with hyperglycemia: Secondary | ICD-10-CM | POA: Diagnosis not present

## 2022-12-06 DIAGNOSIS — I10 Essential (primary) hypertension: Secondary | ICD-10-CM | POA: Diagnosis not present

## 2022-12-06 DIAGNOSIS — E782 Mixed hyperlipidemia: Secondary | ICD-10-CM | POA: Diagnosis not present

## 2022-12-06 MED ORDER — RYBELSUS 7 MG PO TABS: 7.0000 mg | ORAL_TABLET | Freq: Every day | ORAL | 0 refills | Status: DC

## 2022-12-06 MED ORDER — RYBELSUS 3 MG PO TABS: 3.0000 mg | ORAL_TABLET | Freq: Every day | ORAL | 0 refills | Status: DC

## 2022-12-06 NOTE — Progress Notes (Signed)
Subjective:  [name] is a 77 y.o. female who presents for Chief Complaint  Patient presents with   Consult    Having back surgery coming up and pre-op BS was 344 last 344. Readings since Sunday 252, Monday 165 236, Tuesday 222 228, Wednesday 272 , stopped metformin for a while and started back up on Friday. Does not want to be on metformin due to diarrhea constantly     Here for concerns  She notes she is trying to get surgery on her back but at her preop last week she had glucose over 300. She hasn't been taking her metformin for a while due to diarrhea.  Surgery canceled for now.  Couldn't get in to see Huntley Dec NP her PCP this week.   Last year did try weekly injection Mounjaro,  but lost 20lb and had lots of side effects so she discontinued this.   She is compliant with other medications listed today  Glucose was 252 this morning  She denies eating a lot of junk but did eat watermelon last night at 2am.  No other aggravating or relieving factors.    No other c/o.  Past Medical History:  Diagnosis Date   (HFpEF) heart failure with preserved ejection fraction (HCC)    Abnormal bone density screening    Arthritis    Cancer (HCC) 07/2017   left breast cancer   Complication of anesthesia    "i wake up"   Diverticulosis    DM (diabetes mellitus) (HCC)    Family history of colon cancer    Family history of ovarian cancer    Family history of pancreatic cancer    Family history of prostate cancer    Family history of stomach cancer    GERD 08/11/2008   Qualifier: Diagnosis of  By: Clent Ridges MD, Tera Mater    GERD (gastroesophageal reflux disease)    Heart murmur    pt has had an echo   History of radiation therapy 07/10/17- 08/06/17   40.05 Gy directed to the left breast in 15 fractions, followed by a boost of 10 Gy in 5 fractions.    HLD (hyperlipidemia)    HTN (hypertension)    Hyperlipidemia    Hypertriglyceridemia 04/13/2019   Osteoporosis 06/15/2017   Personal history  of radiation therapy    Poor compliance    Uncontrolled hypertension 03/20/2019   Vitamin D deficiency    Current Outpatient Medications on File Prior to Visit  Medication Sig Dispense Refill   amLODipine (NORVASC) 10 MG tablet TAKE 1 TABLET BY MOUTH EVERY DAY 15 tablet 0   aspirin EC 81 MG tablet Take 81 mg by mouth daily.     calcium-vitamin D (OSCAL WITH D) 250-125 MG-UNIT tablet Take 1 tablet by mouth daily.     carvedilol (COREG) 25 MG tablet Take 1 tablet (25 mg total) by mouth 2 (two) times daily. 180 tablet 3   furosemide (LASIX) 40 MG tablet Take 1 tablet (40 mg total) by mouth daily. 90 tablet 3   gabapentin (NEURONTIN) 300 MG capsule Take 300 mg by mouth 3 (three) times daily.     losartan-hydrochlorothiazide (HYZAAR) 100-12.5 MG tablet TAKE 1 TABLET BY MOUTH EVERY DAY 90 tablet 0   meloxicam (MOBIC) 15 MG tablet Take 15 mg by mouth daily.     pravastatin (PRAVACHOL) 20 MG tablet TAKE 1 TABLET BY MOUTH EVERY DAY 90 tablet 0   vitamin C (ASCORBIC ACID) 500 MG tablet Take 500 mg by mouth  daily.     Accu-Chek Softclix Lancets lancets 1 each by Other route 2 (two) times daily. Used to check blood sugars twice daily. DX code E11.9.     Blood Glucose Monitoring Suppl (ACCU-CHEK AVIVA PLUS) w/Device KIT USE AS DIRECTED 2 TIMES DAILY 1 kit 0   glucose blood test strip Used to check blood sugars twice daily. DX code E11.9. 100 each 12   HYDROcodone-acetaminophen (NORCO/VICODIN) 5-325 MG tablet Take 1-2 tablets by mouth every 6 (six) hours as needed for moderate pain. (Patient not taking: Reported on 11/29/2022) 30 tablet 0   KLOR-CON M20 20 MEQ tablet TAKE 1 TABLET BY MOUTH EVERY DAY 90 tablet 3   No current facility-administered medications on file prior to visit.     The following portions of the patient's history were reviewed and updated as appropriate: allergies, current medications, past family history, past medical history, past social history, past surgical history and problem  list.  ROS Otherwise as in subjective above    Objective: BP 122/80   Pulse 69   Wt 192 lb 9.6 oz (87.4 kg)   BMI 31.09 kg/m   Wt Readings from Last 3 Encounters:  12/06/22 192 lb 9.6 oz (87.4 kg)  11/30/22 193 lb 9.6 oz (87.8 kg)  08/02/22 182 lb (82.6 kg)   General appearance: alert, no distress, well developed, well nourished Psych: pleasant, answers questions appropriately  No edema   I reviewed recent labs from last week in the EMR   Assessment: Encounter Diagnoses  Name Primary?   Uncontrolled type 2 diabetes mellitus with hyperglycemia (HCC) Yes   Essential hypertension    Mixed hyperlipidemia    Chronic diastolic heart failure (HCC)      Plan: Uncontrolled diabetes - recent glucose over 300.  Counseled on diet, avoid indiscretions, continue glucose monitoring.  She has problems with mounjaro last year with rapid weight loss but has tolerated rybelsus.  Restart Rybelsus, 3mg  x 3 weeks, then increase to 7mg  daily.  Change metformin XR 500mg  to once daily as she hasn't tolerated any higher dose due to loose stools.  Discussed other medication options.   Call report on glucose readings in 1 week.  Also discussed possibly adding short term insulin.  She declines for now.  HTN-continue current medicaiton  Dyslipidemia - continue Pravachol 20mg  daily, aspirin 81mg  daily  CHF - continue cardiology follow up, current medications   [facility]  Diagnoses and all orders for this visit:  Uncontrolled type 2 diabetes mellitus with hyperglycemia (HCC)  Essential hypertension  Mixed hyperlipidemia  Chronic diastolic heart failure (HCC)  Other orders -     Semaglutide (RYBELSUS) 3 MG TABS; Take 1 tablet (3 mg total) by mouth daily. -     Semaglutide (RYBELSUS) 7 MG TABS; Take 1 tablet (7 mg total) by mouth daily.    Follow up: call report next week

## 2022-12-06 NOTE — Patient Instructions (Signed)
Recommendations: Begin only 1 tablet of your Metformin 500mg  XR daily.  So instead of twice daily or other, just do 1 tablet daily  If you can't even tolerate 1 metformin daily, let me know and we will change to something else Begin back on Rybelsus oral tablet.  Start 3mg  daily for at least 2-3 weeks, then increase to the 7mg  tablet daily Check fasting glucose every morning , goal is 80-130 fasting.   If you consistently have numbers close to 200 or higher 7 days from now, then I may need to add insulin or other medicaiton for the short term Be real careful with diet Drink plenty of water throughout the day We will recheck blood count in a few weeks as your recent labs showed mild drop in hemoglobin

## 2022-12-12 ENCOUNTER — Ambulatory Visit: Admission: RE | Admit: 2022-12-12 | Payer: Medicare HMO | Source: Ambulatory Visit

## 2022-12-12 ENCOUNTER — Encounter: Admission: RE | Payer: Self-pay | Source: Ambulatory Visit

## 2022-12-12 SURGERY — LUMBAR LAMINECTOMY/ DECOMPRESSION WITH MET-RX
Anesthesia: General | Laterality: Right

## 2022-12-22 ENCOUNTER — Other Ambulatory Visit: Payer: Self-pay | Admitting: Medical

## 2022-12-22 MED ORDER — BD PEN NEEDLE NANO U/F 32G X 4 MM MISC: 1.0000 | Freq: Every day | 3 refills | Status: DC

## 2022-12-22 MED ORDER — INSULIN GLARGINE (1 UNIT DIAL) 300 UNIT/ML ~~LOC~~ SOPN: 10.0000 [IU] | PEN_INJECTOR | Freq: Every day | SUBCUTANEOUS | 1 refills | Status: DC

## 2023-01-03 ENCOUNTER — Other Ambulatory Visit: Payer: Self-pay | Admitting: Nurse Practitioner

## 2023-01-03 DIAGNOSIS — E1169 Type 2 diabetes mellitus with other specified complication: Secondary | ICD-10-CM

## 2023-01-03 DIAGNOSIS — I10 Essential (primary) hypertension: Secondary | ICD-10-CM

## 2023-02-09 ENCOUNTER — Ambulatory Visit: Payer: Medicare HMO | Admitting: Nurse Practitioner

## 2023-02-09 ENCOUNTER — Encounter: Payer: Self-pay | Admitting: Nurse Practitioner

## 2023-02-09 VITALS — BP 124/80 | HR 63 | Ht 64.0 in | Wt 196.6 lb

## 2023-02-09 DIAGNOSIS — E785 Hyperlipidemia, unspecified: Secondary | ICD-10-CM

## 2023-02-09 DIAGNOSIS — Z0000 Encounter for general adult medical examination without abnormal findings: Secondary | ICD-10-CM

## 2023-02-09 DIAGNOSIS — E1169 Type 2 diabetes mellitus with other specified complication: Secondary | ICD-10-CM

## 2023-02-09 DIAGNOSIS — R600 Localized edema: Secondary | ICD-10-CM

## 2023-02-09 DIAGNOSIS — I5032 Chronic diastolic (congestive) heart failure: Secondary | ICD-10-CM | POA: Diagnosis not present

## 2023-02-09 DIAGNOSIS — Z23 Encounter for immunization: Secondary | ICD-10-CM

## 2023-02-09 DIAGNOSIS — I152 Hypertension secondary to endocrine disorders: Secondary | ICD-10-CM

## 2023-02-09 DIAGNOSIS — M5441 Lumbago with sciatica, right side: Secondary | ICD-10-CM

## 2023-02-09 DIAGNOSIS — E1159 Type 2 diabetes mellitus with other circulatory complications: Secondary | ICD-10-CM

## 2023-02-09 DIAGNOSIS — R2689 Other abnormalities of gait and mobility: Secondary | ICD-10-CM

## 2023-02-09 DIAGNOSIS — E1165 Type 2 diabetes mellitus with hyperglycemia: Secondary | ICD-10-CM | POA: Diagnosis not present

## 2023-02-09 DIAGNOSIS — I700 Atherosclerosis of aorta: Secondary | ICD-10-CM | POA: Diagnosis not present

## 2023-02-09 DIAGNOSIS — E559 Vitamin D deficiency, unspecified: Secondary | ICD-10-CM

## 2023-02-09 DIAGNOSIS — R011 Cardiac murmur, unspecified: Secondary | ICD-10-CM

## 2023-02-09 MED ORDER — FREESTYLE LIBRE 3 PLUS SENSOR MISC: 11 refills | Status: DC

## 2023-02-09 NOTE — Progress Notes (Signed)
Subjective:    [name] is a 77 y.o. female who presents for Preventative Services visit and chronic medical problems/med check visit.    Primary Care Provider Giovanna Kemmerer, Sung Amabile, NP here for primary care  [name] presents today for her annual wellness exam.   She reports she has been managing her blood sugar levels in preparation for an upcoming back surgery. She reports a significant improvement in her blood sugar control, with recent readings consistently below 200 post meals and morning readings between 115 and 130. This improvement is attributed to dietary changes, including a high protein breakfast and avoidance of late-night snacking. She has been seeing Vincenza Hews for this. We discuss blood sugar monitoring, and she is very interested in using a CGM for continuous monitoring while she is taking insulin and actively working to get her blood sugars under better control.   The patient also reports intermittent back pain, managed with Gabapentin, and is currently pain-free at the time of this visit. She expresses hope to undergo back surgery within the next month. The patient also mentions occasional numbness and a sensation of curvature in her feet, suspected to be neuropathy.  The patient has been proactive in her health management, with a focus on diet and blood sugar control. She expresses a desire to increase her physical activity and lose weight. Despite her efforts, she reports feeling labeled as "overly obese" by an online diabetic food program, which has caused some distress.  The patient denies any current pain, shortness of breath, or palpitations. She reports feeling safe at home and in her relationships. She has some difficulty with stairs due to her back pain but has handrails installed for support. She does not use a life alert system or mobility aids, but she does have a shower chair for convenience and safety.  The patient has a history of cancer but has been released from  oncology follow-up after five years. She is due for a mammogram. She wears dentures and has not seen a dentist recently. She expresses a need to see an eye doctor and plans to schedule an appointment with a recommended provider.   She has declined the shingles vaccine due to concerns about side effects. Current Health Care Team: Eye doctor, Dr. Bradly Chris  Medical Services you may have received from other than [facility] providers in the past year (date may be approximate)  Physical Exam Vitals and nursing note reviewed.  Constitutional:      Appearance: Normal appearance.  HENT:     Head: Normocephalic.     Right Ear: Tympanic membrane normal.     Left Ear: Tympanic membrane normal.     Nose: Nose normal.     Mouth/Throat:     Pharynx: Oropharynx is clear.  Eyes:     Conjunctiva/sclera: Conjunctivae normal.     Pupils: Pupils are equal, round, and reactive to light.  Neck:     Vascular: No carotid bruit.  Cardiovascular:     Rate and Rhythm: Normal rate and regular rhythm.     Pulses: Normal pulses.     Heart sounds: Murmur heard.  Pulmonary:     Effort: Pulmonary effort is normal.     Breath sounds: Normal breath sounds.  Abdominal:     General: Bowel sounds are normal.     Palpations: Abdomen is soft.  Musculoskeletal:        General: Normal range of motion.     Cervical back: No tenderness.     Right  lower leg: Edema present.     Left lower leg: Edema present.  Lymphadenopathy:     Cervical: No cervical adenopathy.  Skin:    General: Skin is warm and dry.     Capillary Refill: Capillary refill takes less than 2 seconds.  Neurological:     General: No focal deficit present.     Mental Status: She is alert and oriented to person, place, and time.  Psychiatric:        Mood and Affect: Mood normal.      Exercise Current exercise habits: Exercise is limited by orthopedic condition(s): back pain- surgery pending.   Nutrition/Diet Current diet: in general, a "healthy" diet   , diabetic  Depression Screen    02/09/2023    9:28 AM  Depression screen PHQ 2/9  Decreased Interest 0  Down, Depressed, Hopeless 0  PHQ - 2 Score 0    Activities of Daily Living Screen/Functional Status Survey Is the patient deaf or have difficulty hearing?: No Does the patient have difficulty seeing, even when wearing glasses/contacts?: Yes Does the patient have difficulty concentrating, remembering, or making decisions?: No Does the patient have difficulty walking or climbing stairs?: Yes (pinched nerve) Does the patient have difficulty dressing or bathing?: No Does the patient have difficulty doing errands alone such as visiting a doctor's office or shopping?: No  Can patient draw a clock face showing 3:15 oclock, yes  Fall Risk Screen    02/09/2023    9:28 AM 11/23/2021   11:45 AM 08/29/2021   10:47 AM 06/10/2021   10:26 AM 07/29/2020    8:39 AM  Fall Risk   Falls in the past year? 1 0 0 0 0  Number falls in past yr: 1 0 0 0 0  Injury with Fall? 0 0 0 0 0  Risk for fall due to : No Fall Risks No Fall Risks History of fall(s) No Fall Risks No Fall Risks  Follow up Falls evaluation completed Falls evaluation completed Falls evaluation completed Falls evaluation completed Falls evaluation completed    Gait Assessment: Normal gait observed yes  Advanced directives Does patient have a Health Care Power of Attorney? Yes Does patient have a Living Will? Yes  Past Medical History:  Diagnosis Date   (HFpEF) heart failure with preserved ejection fraction (HCC)    Abnormal bone density screening    Arthritis    Cancer (HCC) 07/2017   left breast cancer   CHEST PAIN, SUBSTERNAL 11/20/2007   Qualifier: Diagnosis of   By: Clent Ridges MD, Tera Mater        Complication of anesthesia    "i wake up"   Diverticulosis    DM (diabetes mellitus) (HCC)    Family history of colon cancer    Family history of colon cancer    Family history of ovarian cancer    Family history of pancreatic  cancer    Family history of prostate cancer    Family history of stomach cancer    GERD 08/11/2008   Qualifier: Diagnosis of  By: Clent Ridges MD, Tera Mater    GERD (gastroesophageal reflux disease)    Heart murmur    pt has had an echo   History of radiation therapy 07/10/17- 08/06/17   40.05 Gy directed to the left breast in 15 fractions, followed by a boost of 10 Gy in 5 fractions.    HLD (hyperlipidemia)    HTN (hypertension)    Hyperlipidemia    Hypertriglyceridemia 04/13/2019  Osteoporosis 06/15/2017   Personal history of radiation therapy    Poor compliance    Right-sided epistaxis 05/19/2022   Screening for breast cancer 02/05/2017   Uncontrolled hypertension 03/20/2019   Vitamin D deficiency     Past Surgical History:  Procedure Laterality Date   APPENDECTOMY     BREAST BIOPSY Left 04/24/2017   malignant   BREAST BIOPSY Left 04/24/2017   benign   BREAST BIOPSY Left 04/25/2017   benign   BREAST BIOPSY Left 04/2018   fibroadenoma w/ calcs   BREAST LUMPECTOMY Left 05/30/2017   BREAST LUMPECTOMY WITH RADIOACTIVE SEED LOCALIZATION Left 05/30/2017   Procedure: LEFT BREAST LUMPECTOMY WITH RADIOACTIVE SEED LOCALIZATION;  Surgeon: Harriette Bouillon, MD;  Location: [facility] OR;  Service: General;  Laterality: Left;   CHOLECYSTECTOMY  09/2021   COLONOSCOPY  06/12/2005   Schooler/normal exam="i woke up during it"   DILATION AND CURETTAGE OF UTERUS     FOREIGN BODY REMOVAL ESOPHAGEAL     TONSILLECTOMY     TUBAL LIGATION      Social History   Socioeconomic History   Marital status: Widowed    Spouse name: Not on file   Number of children: 2   Years of education: Not on file   Highest education level: Not on file  Occupational History   Not on file  Tobacco Use   Smoking status: Never   Smokeless tobacco: Never  Vaping Use   Vaping status: Never Used  Substance and Sexual Activity   Alcohol use: No   Drug use: No   Sexual activity: Not Currently  Other Topics Concern   Not on  file  Social History Narrative   Not on file   Social Determinants of Health   Financial Resource Strain: Not on file  Food Insecurity: Not on file  Transportation Needs: Not on file  Physical Activity: Not on file  Stress: Not on file  Social Connections: Not on file  Intimate Partner Violence: Not on file    Family History  Problem Relation Age of Onset   Colon cancer Mother 62   Ovarian cancer Mother 23       'radium treatment'   Cardiomyopathy Mother    Colon polyps Mother    Prostate cancer Maternal Uncle 66       metastatic 'died from it'   Stomach cancer Maternal Grandmother 94   Heart disease Maternal Grandmother    Diabetes Maternal Grandfather    Pancreatic cancer Maternal Aunt 66   Prostate cancer Maternal Uncle 35       metastatic/ 'died from it'   Cervical cancer Daughter    Thyroid cancer Paternal Uncle    Thyroid cancer Cousin    Esophageal cancer Neg Hx    Rectal cancer Neg Hx      Current Outpatient Medications:    Accu-Chek Softclix Lancets lancets, 1 each by Other route 2 (two) times daily. Used to check blood sugars twice daily. DX code E11.9., Disp: , Rfl:    amLODipine (NORVASC) 10 MG tablet, TAKE 1 TABLET BY MOUTH EVERY DAY, Disp: 15 tablet, Rfl: 0   aspirin EC 81 MG tablet, Take 81 mg by mouth daily., Disp: , Rfl:    Blood Glucose Monitoring Suppl (ACCU-CHEK AVIVA PLUS) w/Device KIT, USE AS DIRECTED 2 TIMES DAILY, Disp: 1 kit, Rfl: 0   calcium-vitamin D (OSCAL WITH D) 250-125 MG-UNIT tablet, Take 1 tablet by mouth daily., Disp: , Rfl:    carvedilol (COREG) 25 MG tablet,  Take 1 tablet (25 mg total) by mouth 2 (two) times daily., Disp: 180 tablet, Rfl: 3   Continuous Glucose Sensor (FREESTYLE LIBRE 3 PLUS SENSOR) MISC, Change sensor every 15 days., Disp: 2 each, Rfl: 11   furosemide (LASIX) 40 MG tablet, Take 1 tablet (40 mg total) by mouth daily., Disp: 90 tablet, Rfl: 3   gabapentin (NEURONTIN) 300 MG capsule, Take 300 mg by mouth 3 (three) times  daily., Disp: , Rfl:    glucose blood test strip, Used to check blood sugars twice daily. DX code E11.9., Disp: 100 each, Rfl: 12   insulin glargine, 1 Unit Dial, (TOUJEO) 300 UNIT/ML Solostar Pen, Inject 10 Units into the skin daily., Disp: 15 mL, Rfl: 1   Insulin Pen Needle (BD PEN NEEDLE NANO U/F) 32G X 4 MM MISC, 1 each by Does not apply route at bedtime., Disp: 100 each, Rfl: 3   KLOR-CON M20 20 MEQ tablet, TAKE 1 TABLET BY MOUTH EVERY DAY, Disp: 90 tablet, Rfl: 3   losartan-hydrochlorothiazide (HYZAAR) 100-12.5 MG tablet, TAKE 1 TABLET BY MOUTH EVERY DAY, Disp: 90 tablet, Rfl: 0   meloxicam (MOBIC) 15 MG tablet, Take 15 mg by mouth daily., Disp: , Rfl:    pravastatin (PRAVACHOL) 20 MG tablet, TAKE 1 TABLET BY MOUTH EVERY DAY, Disp: 90 tablet, Rfl: 0   vitamin C (ASCORBIC ACID) 500 MG tablet, Take 500 mg by mouth daily., Disp: , Rfl:   Allergies  Allergen Reactions   Atorvastatin Other (See Comments)    dizziness   Fosamax [Alendronate Sodium] Cough    Cough,trouble swallowing, chest pain   Lisinopril Cough   Tetracycline Nausea And Vomiting    History reviewed: allergies, current medications, past family history, past medical history, past social history, past surgical history and problem list  Chronic issues discussed: Back pain, blood sugars, blood pressure  Acute issues discussed: none  Objective:      Biometrics BP 124/80   Pulse 63   Ht 5\' 4"  (1.626 m)   Wt 196 lb 9.6 oz (89.2 kg)   BMI 33.75 kg/m   Cognitive Testing  Alert? Yes  Normal Appearance?Yes  Oriented to person? Yes  Place? Yes   Time? Yes  Recall of three objects?  Yes  Can perform simple calculations? Yes  Displays appropriate judgment?Yes  Can read the correct time from a watch face?Yes  General appearance: alert, no distress, WD/WN, 77 y/o female  Nutritional Status: Inadequate calore intake? no Loss of muscle mass? no Loss of fat beneath skin? no Localized or general edema?  no Diminished functional status? no  Other pertinent exam: HEENT: normocephalic, sclerae anicteric, TMs pearly, nares patent, no discharge or erythema, pharynx normal Oral cavity: MMM, no lesions Neck: supple, no lymphadenopathy, no thyromegaly, no masses Heart: RRR, normal S1, S2, no murmurs Lungs: CTA bilaterally, no wheezes, rhonchi, or rales Abdomen: +bs, soft, non tender, non distended, no masses, no hepatomegaly, no splenomegaly Musculoskeletal: nontender, no swelling, no obvious deformity Extremities: no edema, no cyanosis, no clubbing Pulses: 2+ symmetric, upper and lower extremities, normal cap refill Neurological: alert, oriented x 3, CN2-12 intact, strength normal upper extremities and lower extremities, sensation normal throughout, DTRs 2+ throughout, no cerebellar signs, gait normal Psychiatric: normal affect, behavior normal, pleasant    Assessment:   Encounter Diagnoses  Name Primary?   Need for influenza vaccination    HEART MURMUR    Hyperlipidemia associated with type 2 diabetes mellitus (HCC)    Encounter for annual physical exam  Yes   Vitamin D deficiency    Hypertension associated with diabetes (HCC)    Aortic atherosclerosis (HCC)    Bilateral leg edema    Chronic diastolic heart failure (HCC)    Type 2 diabetes mellitus with hyperglycemia, without long-term current use of insulin (HCC)    Uncontrolled type 2 diabetes mellitus with hyperglycemia (HCC)    Acute right-sided low back pain with right-sided sciatica    Balance problem      Plan:   A preventative services visit was completed today.  During the course of the visit today, we discussed and counseled about appropriate screening and preventive services.  A health risk assessment was established today that included a review of current medications, allergies, social history, family history, medical and preventative health history, biometrics, and preventative screenings to identify potential safety  concerns or impairments.  A personalized plan was printed today for your records and use.   Personalized health advice and education was given today to reduce health risks and promote self management and wellness.  Information regarding end of life planning was discussed today.  Conditions/risks identified: None  Chronic problems discussed today: Upcoming back surgery, diabetes, high blood pressure  Acute problems discussed today: See HPI  Recommendations: I recommend a yearly ophthalmology/optometry visit for glaucoma screening and eye checkup I recommended a yearly dental visit for hygiene and checkup Advanced directives - discussed nature and purpose of Advanced Directives, encouraged them to complete them if they have not done so and/or encouraged them to get [place] a copy if they have done this already. I recommend a screening mammogram every 1-2 years   Referrals today: N/A  Immunizations: I recommended a yearly influenza vaccine, typically in September when the vaccine is usually available Is the Pneumococcal vaccine up to date: yes. Is the Shingles vaccine up to date: yes.   Is the Td/Tdap vaccine up to date: yes.   Medicare Attestation A preventative services visit was completed today.  During the course of the visit the patient was educated and counseled about appropriate screening and preventive services.  A health risk assessment was established with the patient that included a review of current medications, allergies, social history, family history, medical and preventative health history, biometrics, and preventative screenings to identify potential safety concerns or impairments.  A personalized plan was printed today for the patient's records and use.   Personalized health advice and education was given today to reduce health risks and promote self management and wellness.  Information regarding end of life planning was discussed today.  Tollie Eth, NP   02/19/2023

## 2023-02-09 NOTE — Patient Instructions (Signed)
  Ms. Blanchard , Thank you for taking time to come for your Medicare Wellness Visit. I appreciate your ongoing commitment to your health goals. Please review the following plan we discussed and let me know if I can assist you in the future.   These are the goals we discussed:  Goals       Blood Pressure < 130/80      Peak Blood Glucose<180 (pt-stated)      Continue to work on managing blood sugars and increase exercise.         This is a list of the screening recommended for you and due dates:  Health Maintenance  Topic Date Due   DTaP/Tdap/Td vaccine (1 - Tdap) Never done   Zoster (Shingles) Vaccine (1 of 2) Never done   Yearly kidney health urinalysis for diabetes  05/09/2018   Eye exam for diabetics  03/03/2019   Complete foot exam   12/15/2021   Medicare Annual Wellness Visit  08/30/2022   Hemoglobin A1C  06/02/2023   Yearly kidney function blood test for diabetes  11/30/2023   Colon Cancer Screening  01/05/2028   Pneumonia Vaccine  Completed   Flu Shot  Completed   DEXA scan (bone density measurement)  Completed   Hepatitis C Screening  Completed   HPV Vaccine  Aged Out   COVID-19 Vaccine  Discontinued

## 2023-02-11 LAB — CMP14+EGFR
ALT: 13 IU/L (ref 0–32)
AST: 13 IU/L (ref 0–40)
Albumin: 4.2 g/dL (ref 3.8–4.8)
Alkaline Phosphatase: 103 [IU]/L (ref 44–121)
BUN/Creatinine Ratio: 22 (ref 12–28)
BUN: 27 mg/dL (ref 8–27)
Bilirubin Total: 0.4 mg/dL (ref 0.0–1.2)
CO2: 23 mmol/L (ref 20–29)
Calcium: 9.9 mg/dL (ref 8.7–10.3)
Chloride: 101 mmol/L (ref 96–106)
Creatinine, Ser: 1.21 mg/dL — ABNORMAL HIGH (ref 0.57–1.00)
Globulin, Total: 2.7 g/dL (ref 1.5–4.5)
Glucose: 122 mg/dL — ABNORMAL HIGH (ref 70–99)
Potassium: 4.4 mmol/L (ref 3.5–5.2)
Sodium: 138 mmol/L (ref 134–144)
Total Protein: 6.9 g/dL (ref 6.0–8.5)
eGFR: 47 mL/min/{1.73_m2} — ABNORMAL LOW (ref >59)

## 2023-02-11 LAB — CBC WITH DIFFERENTIAL/PLATELET
Basophils Absolute: 0.1 10*3/uL (ref 0.0–0.2)
Basos: 1 %
EOS (ABSOLUTE): 0.4 10*3/uL (ref 0.0–0.4)
Eos: 4 %
Hematocrit: 35.6 % (ref 34.0–46.6)
Hemoglobin: 11 g/dL — ABNORMAL LOW (ref 11.1–15.9)
Immature Grans (Abs): 0.1 10*3/uL (ref 0.0–0.1)
Immature Granulocytes: 1 %
Lymphocytes Absolute: 2.7 10*3/uL (ref 0.7–3.1)
Lymphs: 25 %
MCH: 25.9 pg — ABNORMAL LOW (ref 26.6–33.0)
MCHC: 30.9 g/dL — ABNORMAL LOW (ref 31.5–35.7)
MCV: 84 fL (ref 79–97)
Monocytes Absolute: 0.9 10*3/uL (ref 0.1–0.9)
Monocytes: 9 %
Neutrophils Absolute: 6.6 10*3/uL (ref 1.4–7.0)
Neutrophils: 60 %
Platelets: 325 10*3/uL (ref 150–450)
RBC: 4.25 x10E6/uL (ref 3.77–5.28)
RDW: 18.1 % — ABNORMAL HIGH (ref 11.7–15.4)
WBC: 10.8 10*3/uL (ref 3.4–10.8)

## 2023-02-11 LAB — IRON,TIBC AND FERRITIN PANEL
Ferritin: 20 ng/mL (ref 15–150)
Iron Saturation: 16 % (ref 15–55)
Iron: 61 ug/dL (ref 27–139)
Total Iron Binding Capacity: 388 ug/dL (ref 250–450)
UIBC: 327 ug/dL (ref 118–369)

## 2023-02-11 LAB — MICROALBUMIN / CREATININE URINE RATIO
Creatinine, Urine: 30.3 mg/dL
Microalb/Creat Ratio: <10 mg/g{creat} (ref 0–29)
Microalbumin, Urine: <3 ug/mL

## 2023-02-11 LAB — LIPID PANEL
Chol/HDL Ratio: 5.2 ratio — ABNORMAL HIGH (ref 0.0–4.4)
Cholesterol, Total: 197 mg/dL (ref 100–199)
HDL: 38 mg/dL — ABNORMAL LOW (ref >39)
LDL Chol Calc (NIH): 118 mg/dL — ABNORMAL HIGH (ref 0–99)
Triglycerides: 234 mg/dL — ABNORMAL HIGH (ref 0–149)
VLDL Cholesterol Cal: 41 mg/dL — ABNORMAL HIGH (ref 5–40)

## 2023-02-11 LAB — HEMOGLOBIN A1C
Est. average glucose Bld gHb Est-mCnc: 223 mg/dL
Hgb A1c MFr Bld: 9.4 % — ABNORMAL HIGH (ref 4.8–5.6)

## 2023-02-19 NOTE — Assessment & Plan Note (Signed)
Chronic. Furosemide for management. No alarm symptoms. Recommend compression stockings, elevation of LE's, and continued use of lasix for management.

## 2023-02-19 NOTE — Assessment & Plan Note (Signed)
Diabetes Mellitus Improved glycemic control with fasting blood glucose now consistently below 130 and postprandial glucose below 200. Patient has made significant dietary changes and is monitoring blood glucose levels closely. -Continue current management and dietary modifications. -Order Freestyle Libre continuous glucose monitor for better tracking of blood glucose levels.

## 2023-02-19 NOTE — Assessment & Plan Note (Signed)
Patient reports occasional sensation of feet feeling "curved" on the bottom. This could be related to her diabetes or her back condition. -Continue monitoring symptoms. -Consider further evaluation post back surgery if symptoms persist.

## 2023-02-19 NOTE — Assessment & Plan Note (Signed)
Back Pain Patient is preparing for back surgery and has been working on improving her diabetes control in preparation for this. No current pain due to use of Gabapentin. -Continue Gabapentin as needed for pain control. -Plan for back surgery in the next month.

## 2023-02-19 NOTE — Assessment & Plan Note (Signed)
Chronic. Managed with pravastatin. No concerns.

## 2023-02-19 NOTE — Assessment & Plan Note (Signed)
Chronic. Managed with amlodipine, carvedilol, losartan-hydrochlorothiazide. BP is stable today. Labs pending.

## 2023-03-01 ENCOUNTER — Other Ambulatory Visit: Payer: Self-pay | Admitting: Nurse Practitioner

## 2023-03-01 DIAGNOSIS — E1169 Type 2 diabetes mellitus with other specified complication: Secondary | ICD-10-CM

## 2023-03-01 DIAGNOSIS — I10 Essential (primary) hypertension: Secondary | ICD-10-CM

## 2023-03-07 ENCOUNTER — Ambulatory Visit: Payer: Medicare HMO | Admitting: Medical

## 2023-03-07 VITALS — BP 120/82 | HR 83 | Wt 197.4 lb

## 2023-03-07 DIAGNOSIS — E1165 Type 2 diabetes mellitus with hyperglycemia: Secondary | ICD-10-CM | POA: Diagnosis not present

## 2023-03-07 DIAGNOSIS — E1169 Type 2 diabetes mellitus with other specified complication: Secondary | ICD-10-CM

## 2023-03-07 DIAGNOSIS — Z01818 Encounter for other preprocedural examination: Secondary | ICD-10-CM

## 2023-03-07 DIAGNOSIS — I152 Hypertension secondary to endocrine disorders: Secondary | ICD-10-CM

## 2023-03-07 DIAGNOSIS — E785 Hyperlipidemia, unspecified: Secondary | ICD-10-CM

## 2023-03-07 DIAGNOSIS — I700 Atherosclerosis of aorta: Secondary | ICD-10-CM | POA: Diagnosis not present

## 2023-03-07 DIAGNOSIS — E1159 Type 2 diabetes mellitus with other circulatory complications: Secondary | ICD-10-CM

## 2023-03-07 DIAGNOSIS — Z853 Personal history of malignant neoplasm of breast: Secondary | ICD-10-CM | POA: Diagnosis not present

## 2023-03-07 NOTE — Patient Instructions (Signed)
Recommendations: Increase your Toujeo long acting insulin to 24 units daily Continue Metformin 500mg  XR daily Continue other medicaiton as usual Plan to send [place] blood sugar readings in about 2 weeks If sugars are staying 80-130 fasting before breakfast, then we can move forwards We want to avoid low readings . So any low readings close to 70 or less, let me know ASAP If sugars are not staying under 130 fasting in the next  2 weeks, then you can increase Toujeo 2 units per week until sugars are under 130 regularly Once I get your updated sugars readings in 1- 2 weeks, we may need you to come back for a brief lab visit to recheck hemoglobin and electrolytes

## 2023-03-07 NOTE — Progress Notes (Signed)
Subjective:  [name] is a 77 y.o. female who presents for Chief Complaint  Patient presents with   Consult    Follow-up on Blood Sugars. 99-175. Has company here for the Thanksgiving Holiday.  Surgery Clearance for back surgery   Here for follow-up on diabetes, possible surgery clearance  Medical team: Dr. Hoyt Koch, [place] [facility] Dr. Nanetta Batty, cardiology Dr. Serena Croissant, hematology/oncology Dr. Terrilee Files, general surgery Dr. Lynann Bologna, GI Prior endocrinology a few years ago with Dr. Mohammed Kindle, Sung Amabile, NP here for PCP  She has chronic back issues and is contemplating lumbar spine surgery in the near future  She had a surgical clearance visit with cardiology earlier this year and was cleared but in recent months her sugars have gotten higher.  Dr. Maisie Fus wanted her to be rechecked with primary care before proceeding with surgery given blood sugars running in the 200 and 300s.  She has been on prednisone Dosepak this year at times  She notes in the last month or so her blood sugars have been a lot better.  Her blood sugar readings in the last week ranged from 99-150 fasting much improved from prior.  About a month ago her hemoglobin A1c was 9% down from 14% earlier this year  She is compliant with metformin XR 500 mg once daily and Toujeo 20 units daily  She is compliant with her medical medicines as usual  She takes D3 vitamin D supplement  She has history of breast cancer 5 years ago  Of note history of osteoporosis, was on Prolia but it got too expensive.  Has not been on Prolia in the last 12 months  No other aggravating or relieving factors.    No other c/o.  The following portions of the patient's history were reviewed and updated as appropriate: allergies, current medications, past family history, past medical history, past social history, past surgical history and problem list.  ROS Otherwise as in subjective  above    Objective: BP 120/82   Pulse 83   Wt 197 lb 6.4 oz (89.5 kg)   BMI 33.88 kg/m   BP Readings from Last 3 Encounters:  03/07/23 120/82  02/09/23 124/80  12/06/22 122/80   Wt Readings from Last 3 Encounters:  03/07/23 197 lb 6.4 oz (89.5 kg)  02/09/23 196 lb 9.6 oz (89.2 kg)  12/06/22 192 lb 9.6 oz (87.4 kg)    General appearance: alert, no distress, well developed, well nourished HEENT: normocephalic, sclerae anicteric, conjunctiva pink and moist, no discharge or erythema, pharynx normal Oral cavity: MMM, no lesions Neck: supple, no lymphadenopathy, no thyromegaly, no masses Heart: 2 out of 6 brief murmur in the upper sternal borders, otherwise RRR, normal S1, S2 Lungs: CTA bilaterally, no wheezes, rhonchi, or rales Abdomen: +bs, soft, non tender, non distended, no masses, no hepatomegaly, no splenomegaly Pulses: 2+ radial pulses, 2+ pedal pulses, normal cap refill Ext: no edema   Assessment: Encounter Diagnoses  Name Primary?   Uncontrolled type 2 diabetes mellitus with hyperglycemia (HCC) Yes   Preop examination    Hypertension associated with diabetes (HCC)    Hyperlipidemia associated with type 2 diabetes mellitus (HCC)    Aortic atherosclerosis (HCC)    History of breast cancer      Plan: She just had a well visit here with her PCP 02/09/2023.  I reviewed those notes.  I reviewed cardiology notes from earlier this year from Dr. Allyson Sabal where she was cleared at that time  for surgery  She will need to hold aspirin 5 days before surgery  Her recent blood sugars are looking much better.  We will increase Toujeo some more today and continue metformin  I advised her to monitor blood sugars and call report in 2 weeks.  If sugars are even better in 2 weeks then we should be able to clear her for surgery  I do want to recheck her blood count and basic metabolic panel within 30 days of surgery date since she has been mildly anemic.  The anemia could be anemia of  chronic disease  Her iron was normal 02/09/2023.  She has no bruising or bleeding.  No cardiac symptoms, no pulmonary symptoms.  No prior anesthesia problems  Breast cancer in remission  I reviewed her echocardiogram from June 2023, EKG from March 2024  I reviewed all her labs from her recent physical visit 02/09/2023  Patient Instructions  Recommendations: Increase your Toujeo long acting insulin to 24 units daily Continue Metformin 500mg  XR daily Continue other medicaiton as usual Plan to send [place] blood sugar readings in about 2 weeks If sugars are staying 80-130 fasting before breakfast, then we can move forwards We want to avoid low readings . So any low readings close to 70 or less, let me know ASAP If sugars are not staying under 130 fasting in the next  2 weeks, then you can increase Toujeo 2 units per week until sugars are under 130 regularly Once I get your updated sugars readings in 1- 2 weeks, we may need you to come back for a brief lab visit to recheck hemoglobin and electrolytes      Corrie Dandy "Tiah Asuncion" was seen today for consult.  Diagnoses and all orders for this visit:  Uncontrolled type 2 diabetes mellitus with hyperglycemia (HCC)  Preop examination  Hypertension associated with diabetes (HCC)  Hyperlipidemia associated with type 2 diabetes mellitus (HCC)  Aortic atherosclerosis (HCC)  History of breast cancer    Follow up: Call report in 2 weeks

## 2023-04-16 ENCOUNTER — Other Ambulatory Visit: Payer: Self-pay | Admitting: Cardiovascular Disease

## 2023-04-16 ENCOUNTER — Other Ambulatory Visit: Payer: Self-pay | Admitting: Medical

## 2023-05-11 NOTE — Progress Notes (Deleted)
 Last diabetic eye exam:

## 2023-05-14 ENCOUNTER — Encounter: Payer: Medicare HMO | Admitting: Nurse Practitioner

## 2023-06-14 IMAGING — MG MM DIGITAL SCREENING BILAT W/ TOMO AND CAD
6 of 10 series · 6 of 30 positions shown · non-contrast
Comparison: Previous exam(s).

CLINICAL DATA: Screening.

EXAM:
DIGITAL SCREENING BILATERAL MAMMOGRAM WITH TOMOSYNTHESIS AND CAD
TECHNIQUE: Bilateral screening digital craniocaudal and mediolateral oblique
mammograms were obtained. Bilateral screening digital breast
tomosynthesis was performed. The images were evaluated with
computer-aided detection.

[L CC synth-2D (1 of 2)]
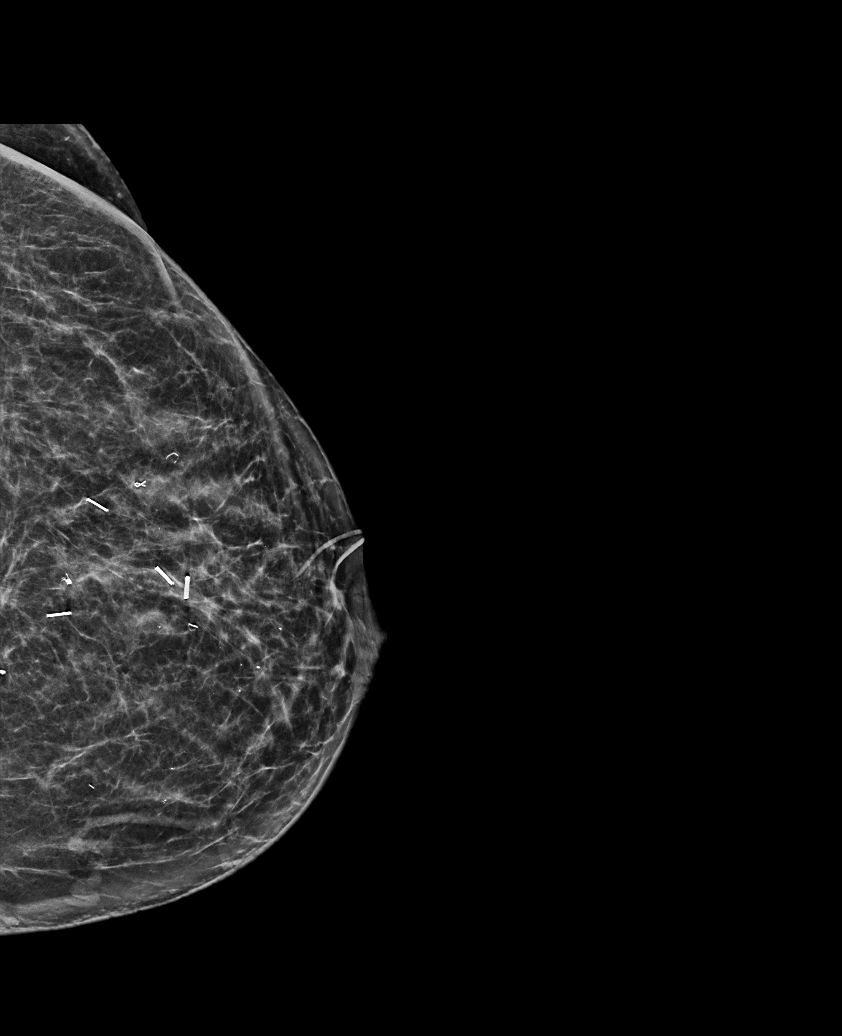

[L CC synth-2D (2 of 2)]
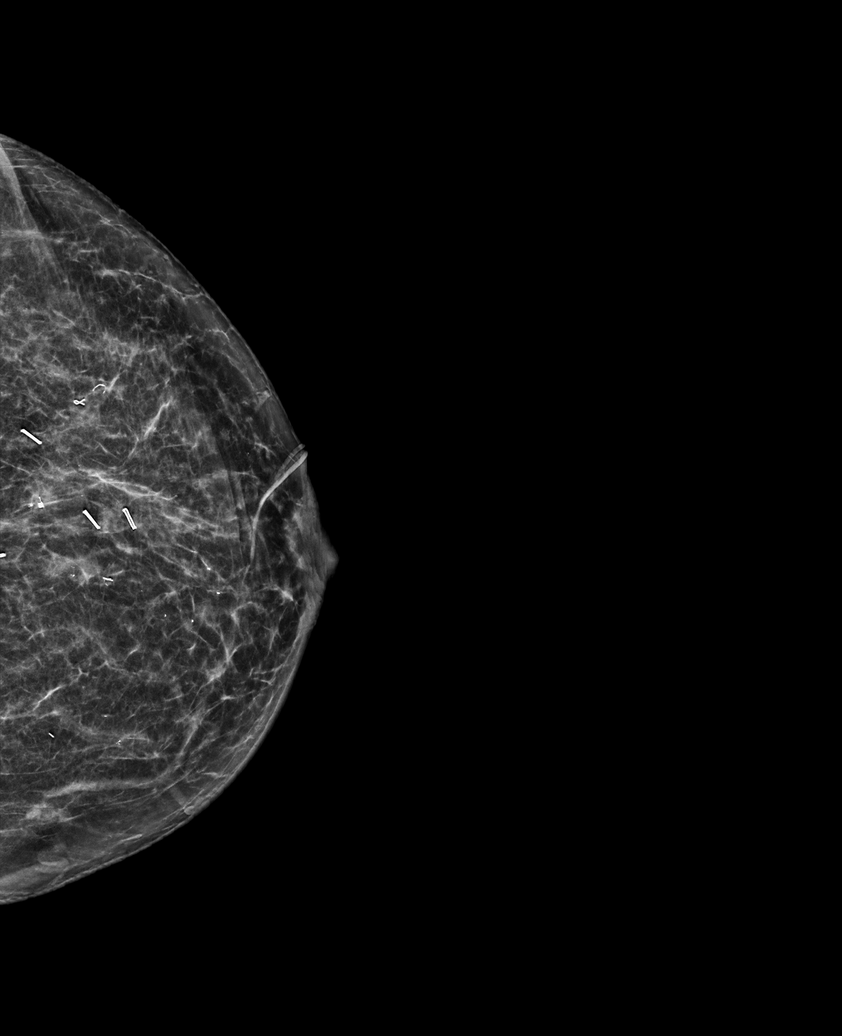

[R MLO synth-2D]
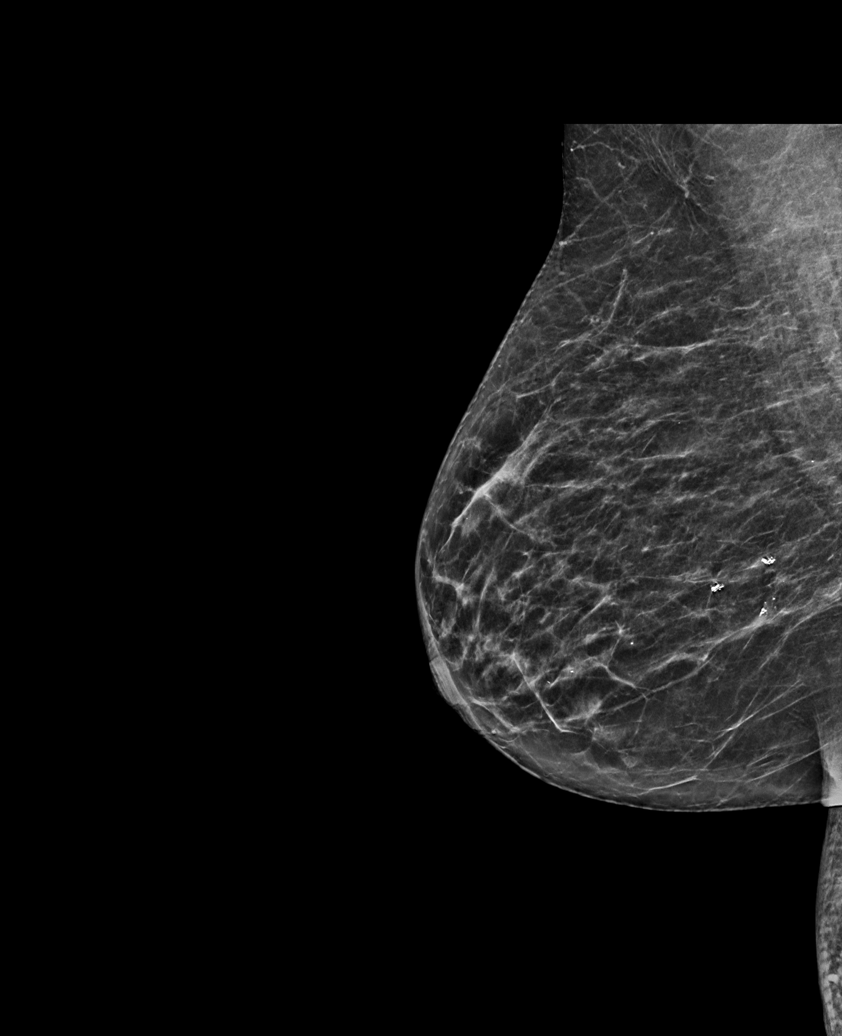

[L MLO synth-2D]
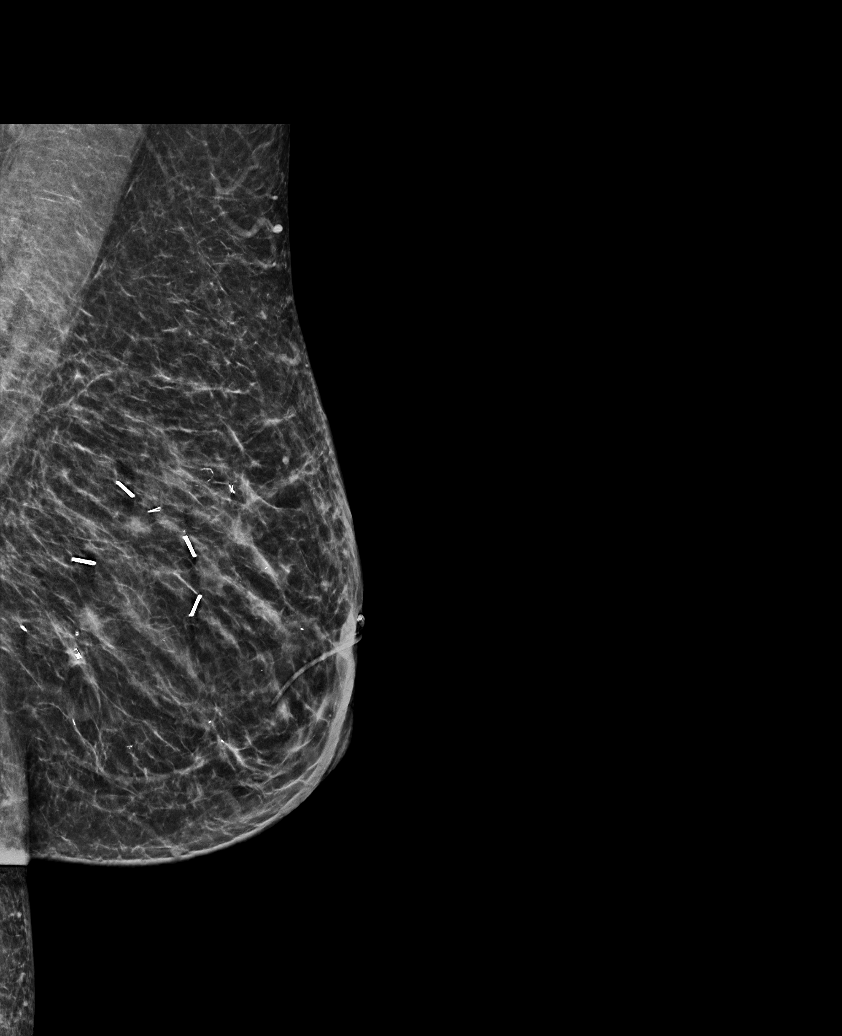

[R CC synth-2D]
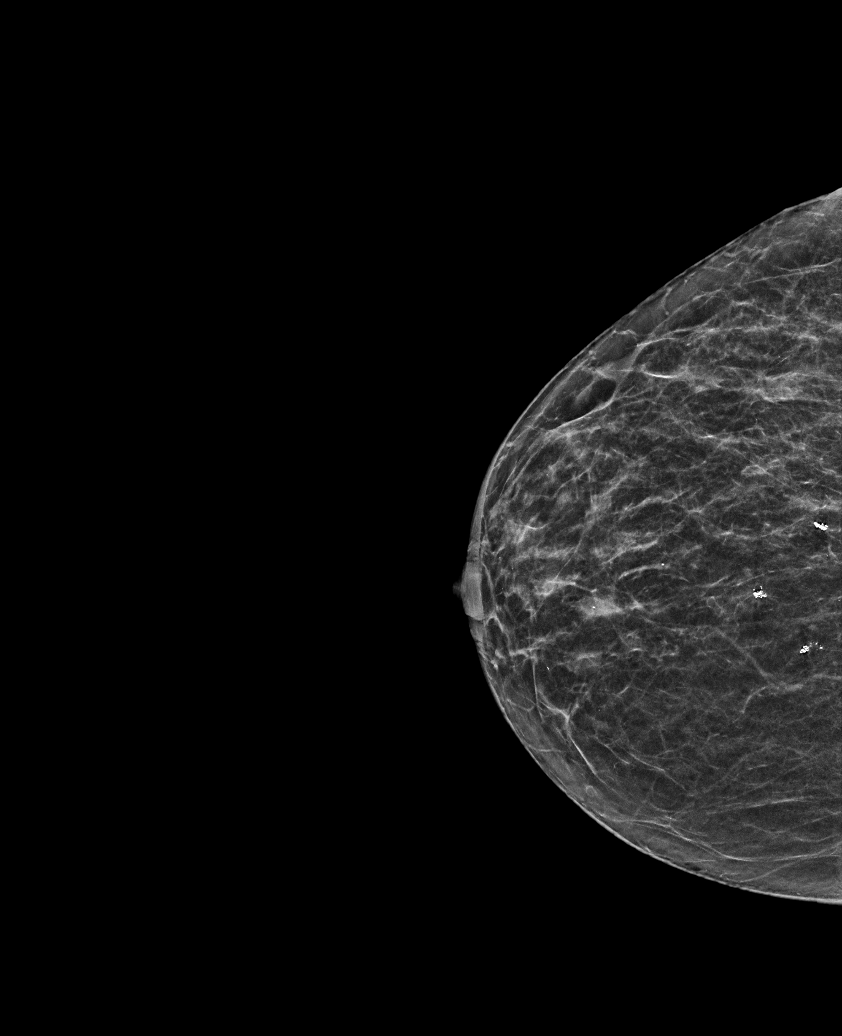

[L CC tomo · tomo slice 29/58.0]
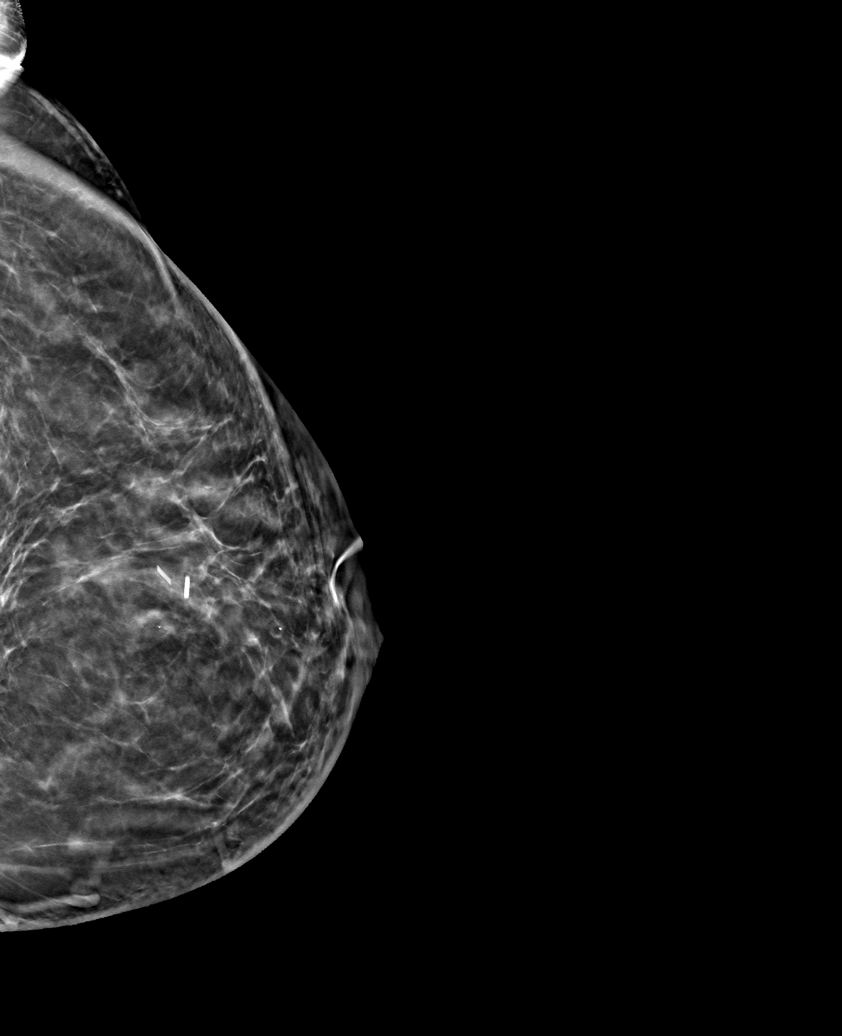

[6 of 30 positions shown; findings below may reference images not displayed]

ACR Breast Density Category c: The breast tissue is heterogeneously
dense, which may obscure small masses.
FINDINGS: In the right breast, a possible asymmetry warrants further
evaluation. In the left breast, no findings suspicious for
malignancy.
IMPRESSION: Further evaluation is suggested for possible asymmetry in the right
breast.

RECOMMENDATION:
Diagnostic mammogram and possibly ultrasound of the right breast.
(Code:[id number])

The patient will be contacted regarding the findings, and additional
imaging will be scheduled.

BI-RADS CATEGORY  0: Incomplete. Need additional imaging evaluation
and/or prior mammograms for comparison.

## 2023-06-30 ENCOUNTER — Other Ambulatory Visit: Payer: Self-pay | Admitting: Cardiovascular Disease

## 2023-06-30 ENCOUNTER — Other Ambulatory Visit: Payer: Self-pay | Admitting: Nurse Practitioner

## 2023-06-30 DIAGNOSIS — E1169 Type 2 diabetes mellitus with other specified complication: Secondary | ICD-10-CM

## 2023-06-30 DIAGNOSIS — I10 Essential (primary) hypertension: Secondary | ICD-10-CM

## 2023-09-24 ENCOUNTER — Other Ambulatory Visit: Payer: Self-pay | Admitting: Cardiovascular Disease

## 2023-09-24 DIAGNOSIS — R600 Localized edema: Secondary | ICD-10-CM

## 2023-09-24 DIAGNOSIS — I5189 Other ill-defined heart diseases: Secondary | ICD-10-CM

## 2023-10-04 ENCOUNTER — Other Ambulatory Visit: Payer: Self-pay | Admitting: Cardiovascular Disease

## 2023-10-11 ENCOUNTER — Other Ambulatory Visit: Payer: Self-pay | Admitting: Cardiovascular Disease

## 2023-10-11 DIAGNOSIS — I5189 Other ill-defined heart diseases: Secondary | ICD-10-CM

## 2023-10-11 DIAGNOSIS — R600 Localized edema: Secondary | ICD-10-CM

## 2023-10-21 DIAGNOSIS — I160 Hypertensive urgency: Secondary | ICD-10-CM | POA: Diagnosis not present

## 2023-10-21 DIAGNOSIS — R7989 Other specified abnormal findings of blood chemistry: Secondary | ICD-10-CM | POA: Diagnosis not present

## 2023-10-21 DIAGNOSIS — R1010 Upper abdominal pain, unspecified: Secondary | ICD-10-CM | POA: Diagnosis not present

## 2023-10-21 DIAGNOSIS — R110 Nausea: Secondary | ICD-10-CM | POA: Diagnosis not present

## 2023-10-21 DIAGNOSIS — I700 Atherosclerosis of aorta: Secondary | ICD-10-CM | POA: Diagnosis not present

## 2023-10-21 DIAGNOSIS — R739 Hyperglycemia, unspecified: Secondary | ICD-10-CM | POA: Diagnosis not present

## 2023-10-21 DIAGNOSIS — E669 Obesity, unspecified: Secondary | ICD-10-CM | POA: Diagnosis not present

## 2023-10-21 DIAGNOSIS — I498 Other specified cardiac arrhythmias: Secondary | ICD-10-CM | POA: Diagnosis not present

## 2023-10-21 DIAGNOSIS — R339 Retention of urine, unspecified: Secondary | ICD-10-CM | POA: Diagnosis not present

## 2023-10-21 DIAGNOSIS — I10 Essential (primary) hypertension: Secondary | ICD-10-CM | POA: Diagnosis not present

## 2023-10-21 DIAGNOSIS — I2510 Atherosclerotic heart disease of native coronary artery without angina pectoris: Secondary | ICD-10-CM | POA: Diagnosis not present

## 2023-10-21 DIAGNOSIS — E1165 Type 2 diabetes mellitus with hyperglycemia: Secondary | ICD-10-CM | POA: Diagnosis not present

## 2023-10-21 DIAGNOSIS — K449 Diaphragmatic hernia without obstruction or gangrene: Secondary | ICD-10-CM | POA: Diagnosis not present

## 2023-10-21 DIAGNOSIS — W19XXXA Unspecified fall, initial encounter: Secondary | ICD-10-CM | POA: Diagnosis not present

## 2023-10-21 DIAGNOSIS — K760 Fatty (change of) liver, not elsewhere classified: Secondary | ICD-10-CM | POA: Diagnosis not present

## 2023-10-21 DIAGNOSIS — I491 Atrial premature depolarization: Secondary | ICD-10-CM | POA: Diagnosis not present

## 2023-10-21 DIAGNOSIS — Z7984 Long term (current) use of oral hypoglycemic drugs: Secondary | ICD-10-CM | POA: Diagnosis not present

## 2023-10-21 DIAGNOSIS — K429 Umbilical hernia without obstruction or gangrene: Secondary | ICD-10-CM | POA: Diagnosis not present

## 2023-10-21 DIAGNOSIS — R000 Tachycardia, unspecified: Secondary | ICD-10-CM | POA: Diagnosis not present

## 2023-10-21 DIAGNOSIS — I517 Cardiomegaly: Secondary | ICD-10-CM | POA: Diagnosis not present

## 2023-10-21 DIAGNOSIS — R079 Chest pain, unspecified: Secondary | ICD-10-CM | POA: Diagnosis not present

## 2023-10-22 DIAGNOSIS — K449 Diaphragmatic hernia without obstruction or gangrene: Secondary | ICD-10-CM | POA: Diagnosis not present

## 2023-10-22 DIAGNOSIS — E1165 Type 2 diabetes mellitus with hyperglycemia: Secondary | ICD-10-CM | POA: Diagnosis not present

## 2023-10-22 DIAGNOSIS — I700 Atherosclerosis of aorta: Secondary | ICD-10-CM | POA: Diagnosis not present

## 2023-10-22 DIAGNOSIS — R110 Nausea: Secondary | ICD-10-CM | POA: Diagnosis not present

## 2023-10-22 DIAGNOSIS — I2510 Atherosclerotic heart disease of native coronary artery without angina pectoris: Secondary | ICD-10-CM | POA: Diagnosis not present

## 2023-10-22 DIAGNOSIS — R1010 Upper abdominal pain, unspecified: Secondary | ICD-10-CM | POA: Diagnosis not present

## 2023-10-22 DIAGNOSIS — I498 Other specified cardiac arrhythmias: Secondary | ICD-10-CM | POA: Diagnosis not present

## 2023-10-22 DIAGNOSIS — I517 Cardiomegaly: Secondary | ICD-10-CM | POA: Diagnosis not present

## 2023-10-22 DIAGNOSIS — Z7984 Long term (current) use of oral hypoglycemic drugs: Secondary | ICD-10-CM | POA: Diagnosis not present

## 2023-10-22 DIAGNOSIS — K429 Umbilical hernia without obstruction or gangrene: Secondary | ICD-10-CM | POA: Diagnosis not present

## 2023-10-22 DIAGNOSIS — I491 Atrial premature depolarization: Secondary | ICD-10-CM | POA: Diagnosis not present

## 2023-10-22 DIAGNOSIS — I10 Essential (primary) hypertension: Secondary | ICD-10-CM | POA: Diagnosis not present

## 2023-10-22 DIAGNOSIS — I160 Hypertensive urgency: Secondary | ICD-10-CM | POA: Diagnosis not present

## 2023-10-22 DIAGNOSIS — K760 Fatty (change of) liver, not elsewhere classified: Secondary | ICD-10-CM | POA: Diagnosis not present

## 2023-10-22 DIAGNOSIS — R339 Retention of urine, unspecified: Secondary | ICD-10-CM | POA: Diagnosis not present

## 2023-10-23 ENCOUNTER — Encounter: Payer: Self-pay | Admitting: Family Medicine

## 2023-10-23 ENCOUNTER — Ambulatory Visit (INDEPENDENT_AMBULATORY_CARE_PROVIDER_SITE_OTHER): Admitting: Family Medicine

## 2023-10-23 ENCOUNTER — Telehealth: Payer: Self-pay | Admitting: Nurse Practitioner

## 2023-10-23 VITALS — BP 140/80 | HR 73 | Wt 204.2 lb

## 2023-10-23 DIAGNOSIS — R339 Retention of urine, unspecified: Secondary | ICD-10-CM | POA: Diagnosis not present

## 2023-10-23 DIAGNOSIS — Z466 Encounter for fitting and adjustment of urinary device: Secondary | ICD-10-CM | POA: Diagnosis not present

## 2023-10-23 DIAGNOSIS — K449 Diaphragmatic hernia without obstruction or gangrene: Secondary | ICD-10-CM | POA: Diagnosis not present

## 2023-10-23 DIAGNOSIS — Z4682 Encounter for fitting and adjustment of non-vascular catheter: Secondary | ICD-10-CM | POA: Diagnosis not present

## 2023-10-23 NOTE — Telephone Encounter (Signed)
 Copied from CRM [id number]. Topic: Referral - Question >> Oct 23, 2023  3:52 PM Willma R wrote: Reason for CRM: Patient states she was given a referral for Urology but Dr Joyce was supposed to place urgent on the referral. Patient is requesting the referral be updated and given a time frame to be seen, otherwise they are unable to see her for a few weeks.  Patient can be reached at [phone]

## 2023-10-23 NOTE — Progress Notes (Signed)
   Subjective:    Patient ID: [name], female    DOB: 02-17-1946, 78 y.o.   MRN: [id number]  HPI She is here for hospital follow-up after recent evaluation for chest pain.  She was seen in the emergency room and [place].  The record was reviewed.  The workup for chest pain was essentially negative however she did have difficulty with her bladder.  She did get a good diuresis but ended up with an 800 cc residual and a catheter was placed.  She was supposed to follow-up with urology but she apparently did not get the message to do that.   Review of Systems     Objective:    Physical Exam Alert and in no distress.  Cardiac exam shows regular rhythm without murmurs gallops.  Lungs are clear to auscultation.  Indwelling catheter is still present. The emergency room record including lab work, x-rays etc. was reviewed.      Assessment & Plan:  Urinary retention - Plan: Ambulatory referral to Urology  [facility] (hiatus hernia) She is really not having any difficulty with chest pain at the present time and does plan to follow-up concerning this.  I am not sure if she is getting primary care here or over in [place].  Referral was made to her urologist of choice.

## 2023-10-23 NOTE — Addendum Note (Signed)
 Addended by: LATTIE CARLO BROCKS on: 10/23/2023 12:23 PM   Modules accepted: Orders

## 2023-10-29 DIAGNOSIS — R339 Retention of urine, unspecified: Secondary | ICD-10-CM | POA: Diagnosis not present

## 2023-11-26 DIAGNOSIS — R1010 Upper abdominal pain, unspecified: Secondary | ICD-10-CM | POA: Diagnosis not present

## 2023-11-26 DIAGNOSIS — Z1211 Encounter for screening for malignant neoplasm of colon: Secondary | ICD-10-CM | POA: Diagnosis not present

## 2023-11-26 DIAGNOSIS — D509 Iron deficiency anemia, unspecified: Secondary | ICD-10-CM | POA: Diagnosis not present

## 2023-11-26 DIAGNOSIS — K760 Fatty (change of) liver, not elsewhere classified: Secondary | ICD-10-CM | POA: Diagnosis not present

## 2023-11-26 DIAGNOSIS — K449 Diaphragmatic hernia without obstruction or gangrene: Secondary | ICD-10-CM | POA: Diagnosis not present

## 2023-11-26 DIAGNOSIS — R1319 Other dysphagia: Secondary | ICD-10-CM | POA: Diagnosis not present

## 2023-11-30 DIAGNOSIS — I700 Atherosclerosis of aorta: Secondary | ICD-10-CM | POA: Diagnosis not present

## 2023-11-30 DIAGNOSIS — Z09 Encounter for follow-up examination after completed treatment for conditions other than malignant neoplasm: Secondary | ICD-10-CM | POA: Diagnosis not present

## 2023-11-30 DIAGNOSIS — Z1211 Encounter for screening for malignant neoplasm of colon: Secondary | ICD-10-CM | POA: Diagnosis not present

## 2023-11-30 DIAGNOSIS — K5730 Diverticulosis of large intestine without perforation or abscess without bleeding: Secondary | ICD-10-CM | POA: Diagnosis not present

## 2023-11-30 DIAGNOSIS — R1319 Other dysphagia: Secondary | ICD-10-CM | POA: Diagnosis not present

## 2023-11-30 DIAGNOSIS — Z860101 Personal history of adenomatous and serrated colon polyps: Secondary | ICD-10-CM | POA: Diagnosis not present

## 2023-11-30 LAB — HM COLONOSCOPY

## 2023-12-29 DIAGNOSIS — M545 Low back pain, unspecified: Secondary | ICD-10-CM | POA: Diagnosis not present

## 2024-01-03 ENCOUNTER — Other Ambulatory Visit: Payer: Self-pay | Admitting: Medical

## 2024-01-03 DIAGNOSIS — H524 Presbyopia: Secondary | ICD-10-CM | POA: Diagnosis not present

## 2024-01-03 DIAGNOSIS — H5213 Myopia, bilateral: Secondary | ICD-10-CM | POA: Diagnosis not present

## 2024-01-03 DIAGNOSIS — H52223 Regular astigmatism, bilateral: Secondary | ICD-10-CM | POA: Diagnosis not present

## 2024-01-11 DIAGNOSIS — K449 Diaphragmatic hernia without obstruction or gangrene: Secondary | ICD-10-CM | POA: Diagnosis not present

## 2024-01-11 DIAGNOSIS — K219 Gastro-esophageal reflux disease without esophagitis: Secondary | ICD-10-CM | POA: Diagnosis not present

## 2024-01-11 DIAGNOSIS — R1319 Other dysphagia: Secondary | ICD-10-CM | POA: Diagnosis not present

## 2024-01-11 DIAGNOSIS — M5416 Radiculopathy, lumbar region: Secondary | ICD-10-CM | POA: Diagnosis not present

## 2024-01-11 DIAGNOSIS — Z6832 Body mass index (BMI) 32.0-32.9, adult: Secondary | ICD-10-CM | POA: Diagnosis not present

## 2024-01-14 ENCOUNTER — Other Ambulatory Visit: Payer: Self-pay | Admitting: Surgery

## 2024-01-14 DIAGNOSIS — M5416 Radiculopathy, lumbar region: Secondary | ICD-10-CM

## 2024-01-15 ENCOUNTER — Encounter: Payer: Self-pay | Admitting: Surgery

## 2024-01-18 ENCOUNTER — Other Ambulatory Visit

## 2024-01-22 ENCOUNTER — Ambulatory Visit
Admission: RE | Admit: 2024-01-22 | Discharge: 2024-01-22 | Disposition: A | Discharge status: Home / Self Care | Source: Ambulatory Visit | Attending: Surgery | Admitting: Surgery

## 2024-01-22 DIAGNOSIS — M5416 Radiculopathy, lumbar region: Secondary | ICD-10-CM

## 2024-01-22 DIAGNOSIS — M5126 Other intervertebral disc displacement, lumbar region: Secondary | ICD-10-CM | POA: Diagnosis not present

## 2024-01-22 DIAGNOSIS — M47817 Spondylosis without myelopathy or radiculopathy, lumbosacral region: Secondary | ICD-10-CM | POA: Diagnosis not present

## 2024-01-22 DIAGNOSIS — M5136 Other intervertebral disc degeneration, lumbar region with discogenic back pain only: Secondary | ICD-10-CM | POA: Diagnosis not present

## 2024-02-04 DIAGNOSIS — Z6833 Body mass index (BMI) 33.0-33.9, adult: Secondary | ICD-10-CM | POA: Diagnosis not present

## 2024-02-04 DIAGNOSIS — M5416 Radiculopathy, lumbar region: Secondary | ICD-10-CM | POA: Diagnosis not present

## 2024-02-12 DIAGNOSIS — M5416 Radiculopathy, lumbar region: Secondary | ICD-10-CM | POA: Diagnosis not present

## 2024-02-14 DIAGNOSIS — E113313 Type 2 diabetes mellitus with moderate nonproliferative diabetic retinopathy with macular edema, bilateral: Secondary | ICD-10-CM | POA: Diagnosis not present

## 2024-02-14 DIAGNOSIS — H25813 Combined forms of age-related cataract, bilateral: Secondary | ICD-10-CM | POA: Diagnosis not present

## 2024-03-02 ENCOUNTER — Other Ambulatory Visit: Payer: Self-pay | Admitting: Nurse Practitioner

## 2024-03-02 DIAGNOSIS — I10 Essential (primary) hypertension: Secondary | ICD-10-CM

## 2024-03-02 DIAGNOSIS — E1169 Type 2 diabetes mellitus with other specified complication: Secondary | ICD-10-CM

## 2024-03-04 DIAGNOSIS — E113311 Type 2 diabetes mellitus with moderate nonproliferative diabetic retinopathy with macular edema, right eye: Secondary | ICD-10-CM | POA: Diagnosis not present

## 2024-03-14 DIAGNOSIS — Z6832 Body mass index (BMI) 32.0-32.9, adult: Secondary | ICD-10-CM | POA: Diagnosis not present

## 2024-03-14 DIAGNOSIS — M47816 Spondylosis without myelopathy or radiculopathy, lumbar region: Secondary | ICD-10-CM | POA: Diagnosis not present

## 2024-03-14 DIAGNOSIS — M5416 Radiculopathy, lumbar region: Secondary | ICD-10-CM | POA: Diagnosis not present

## 2024-03-19 ENCOUNTER — Encounter: Payer: Self-pay | Admitting: Family Medicine

## 2024-03-19 ENCOUNTER — Other Ambulatory Visit: Payer: Self-pay | Admitting: Family Medicine

## 2024-03-19 ENCOUNTER — Ambulatory Visit: Admitting: Family Medicine

## 2024-03-19 VITALS — BP 130/70 | HR 80 | Temp 98.4°F | Ht 64.0 in | Wt 197.2 lb

## 2024-03-19 DIAGNOSIS — I152 Hypertension secondary to endocrine disorders: Secondary | ICD-10-CM

## 2024-03-19 DIAGNOSIS — D649 Anemia, unspecified: Secondary | ICD-10-CM

## 2024-03-19 DIAGNOSIS — E1165 Type 2 diabetes mellitus with hyperglycemia: Secondary | ICD-10-CM

## 2024-03-19 DIAGNOSIS — E1159 Type 2 diabetes mellitus with other circulatory complications: Secondary | ICD-10-CM

## 2024-03-19 DIAGNOSIS — R93429 Abnormal radiologic findings on diagnostic imaging of unspecified kidney: Secondary | ICD-10-CM

## 2024-03-19 DIAGNOSIS — Z5181 Encounter for therapeutic drug level monitoring: Secondary | ICD-10-CM

## 2024-03-19 DIAGNOSIS — E782 Mixed hyperlipidemia: Secondary | ICD-10-CM

## 2024-03-19 DIAGNOSIS — N2889 Other specified disorders of kidney and ureter: Secondary | ICD-10-CM

## 2024-03-19 DIAGNOSIS — Z794 Long term (current) use of insulin: Secondary | ICD-10-CM

## 2024-03-19 DIAGNOSIS — L02415 Cutaneous abscess of right lower limb: Secondary | ICD-10-CM

## 2024-03-19 LAB — POCT GLYCOSYLATED HEMOGLOBIN (HGB A1C): Hemoglobin A1C: 13 % — AB (ref 4.0–5.6)

## 2024-03-19 MED ORDER — SULFAMETHOXAZOLE-TRIMETHOPRIM 800-160 MG PO TABS: 1.0000 | ORAL_TABLET | Freq: Two times a day (BID) | ORAL | 0 refills | Status: DC

## 2024-03-19 MED ORDER — INSULIN GLARGINE (1 UNIT DIAL) 300 UNIT/ML ~~LOC~~ SOPN: 26.0000 [IU] | PEN_INJECTOR | Freq: Every day | SUBCUTANEOUS | 1 refills | Status: DC

## 2024-03-19 NOTE — Telephone Encounter (Signed)
° °  Not covered see alternatives for pt to be switched too

## 2024-03-19 NOTE — Progress Notes (Signed)
 Chief Complaint  Patient presents with   Mass    Has a bump inner thigh right that started a week ago. It is draining. I did let her know that you were ordering MRI. Had cortisone shot 3 weeks ago.    She noticed a painful bump at her right upper thigh.  It got large quickly. It started draining about 3-4 days ago, and size has gotten much smaller.  It continues to drain. She is unsure if perhaps she scratched it while she slept.  Her eye doctor told her the  metformin  wasn't working.  She has diabetic retinopathy that is being treated. Patient has diabetes, which has been poorly controlled.  She was seen in July for hospital f/u by Dr. Carmencita urinary retention and was referred to urologist, Dr. Steen in [facility] ([facility] Otherwise, last visit here was with Ludie for uncontrolled DM in 02/2023, with A1c of 9.4.  At that time her Toujeo  dose was increased to 24 U. She cancelled her f/u visit. She reports compliance in taking 24 units. She states she is on her last pen of the Toujeo . She is compliant with taking metformin  once daily. She reports having diarrhea related to the metformin  (manageable, but why her dose was increased?). She states her sugars were staying down, but recently went up. Also admits to only checking sugars once a month. She has gotten cortisone injections for her back which may have affected her blood sugars. She denies polydipsia, polyuria.   She had MRI spine 01/2024 IMPRESSION: 1. Lumbar spondylosis and degenerative disc disease, causing moderate impingement at L3-4 and L4-5. 2. 2.1 by 2.0 cm complex cyst or mass of the right mid kidney posteromedially. Renal cell carcinoma not excluded. Renal protocol MRI with and without contrast recommended for definitive characterization. 3. Possible upper right retrocaval lymph node 1.0 cm short axis, borderline enlarged. 4. Sigmoid colon diverticulosis. 5. Left eccentric myometrial mass measuring 3.5 cm in  diameter, probably a fibroid.  She has gotten steroid injections for her back pain. Hasn't had any of the other abnormalities addressed.   PMH, PSH, SH reviewed  Outpatient Encounter Medications as of 03/19/2024  Medication Sig Note   Accu-Chek Softclix Lancets lancets 1 each by Other route 2 (two) times daily. Used to check blood sugars twice daily. DX code E11.9.    aspirin EC 81 MG tablet Take 81 mg by mouth daily.    BD PEN NEEDLE NANO 2ND GEN 32G X 4 MM MISC USE ONCE DAILY AT BEDTIME    Blood Glucose Monitoring Suppl (ACCU-CHEK AVIVA PLUS) w/Device KIT USE AS DIRECTED 2 TIMES DAILY    carvedilol  (COREG ) 25 MG tablet TAKE 1 TABLET BY MOUTH TWICE A DAY    dexamethasone  (DECADRON ) 0.1 % ophthalmic solution Place 1 drop into the right eye 2 (two) times daily.    furosemide  (LASIX ) 40 MG tablet TAKE 1 TABLET BY MOUTH EVERY DAY    gabapentin  (NEURONTIN ) 300 MG capsule Take 300 mg by mouth 3 (three) times daily.    glucose blood test strip Used to check blood sugars twice daily. DX code E11.9.    insulin  glargine, 1 Unit Dial , (TOUJEO ) 300 UNIT/ML Solostar Pen Inject 10 Units into the skin daily. 03/19/2024: 24units   ketorolac  (ACULAR ) 0.5 % ophthalmic solution Place 1 drop into the right eye 2 (two) times daily.    losartan -hydrochlorothiazide  (HYZAAR ) 100-12.5 MG tablet TAKE 1 TABLET BY MOUTH EVERY DAY    meloxicam (MOBIC) 15 MG tablet Take  15 mg by mouth daily.    metFORMIN  (GLUMETZA ) 500 MG (MOD) 24 hr tablet Take 500 mg by mouth daily with breakfast.    pantoprazole  (PROTONIX ) 40 MG tablet Take 40 mg by mouth.    potassium chloride  SA (KLOR-CON  M) 20 MEQ tablet TAKE 1 TABLET BY MOUTH EVERY DAY    pravastatin  (PRAVACHOL ) 20 MG tablet TAKE 1 TABLET BY MOUTH EVERY DAY    vitamin C (ASCORBIC ACID) 500 MG tablet Take 500 mg by mouth daily.    [DISCONTINUED] ondansetron  (ZOFRAN -ODT) 4 MG disintegrating tablet Take 4 mg by mouth.    amLODipine  (NORVASC ) 10 MG tablet TAKE 1 TABLET BY MOUTH  EVERY DAY (Patient not taking: Reported on 03/19/2024) 03/19/2024: Needs rx   calcium -vitamin D  (OSCAL WITH D) 250-125 MG-UNIT tablet Take 1 tablet by mouth daily. (Patient not taking: Reported on 03/19/2024)    Continuous Glucose Sensor (FREESTYLE LIBRE 3 PLUS SENSOR) MISC Change sensor every 15 days. (Patient not taking: Reported on 03/19/2024)    dicyclomine (BENTYL) 20 MG tablet Take 20 mg by mouth. (Patient not taking: Reported on 03/19/2024)    No facility-administered encounter medications on file as of 03/19/2024.   Allergies  Allergen Reactions   Atorvastatin  Other (See Comments)    dizziness   Fosamax  [Alendronate  Sodium] Cough    Cough,trouble swallowing, chest pain   Lisinopril  Cough   Ozempic (0.25 Or 0.5 Mg-Dose) [Semaglutide (0.25 Or 0.5mg -Dos)]     RYBELSUS  - bad nausea   Tetracycline Nausea And Vomiting    Allergies  Allergen Reactions   Atorvastatin  Other (See Comments)    dizziness   Fosamax  [Alendronate  Sodium] Cough    Cough,trouble swallowing, chest pain   Lisinopril  Cough   Ozempic (0.25 Or 0.5 Mg-Dose) [Semaglutide (0.25 Or 0.5mg -Dos)]     RYBELSUS  - bad nausea   Tetracycline Nausea And Vomiting    ROS:  Denies fever, but has chills daily. Denies HA, dizziness, URI symptoms, cough, SOB, wheezing, chest pain. Denies vomiting.  Mild nausea with the chills. Some loose stools from the metformin . Abscess per HPI, denies other skin concerns.    PHYSICAL EXAM:  BP 130/70   Pulse 80   Temp 98.4 F (36.9 C) (Tympanic)   Ht 5' 4 (1.626 m)   Wt 197 lb 3.2 oz (89.4 kg)   BMI 33.85 kg/m   Pleasant, elderly female in no distress (but sitting in slightly awkward position on chair due to discomfort). She appears pale. HEENT: conjunctiva and sclera are clear, EOMI Neck: no lymphadenopathy or mass Heart: regular rate and rhythm Lungs: clear bilaterally Back: no CVA tenderness Abdomen: soft, nontender Upper medial/post right thigh--large indurated/firm  area, with central ulceration/open area draining purulent fluid.  When area pressed, no active drainage. Extremities without edema Psych: normal mood, affect, hygiene    Lab Results  Component Value Date   HGBA1C 13.0 (A) 03/19/2024     ASSESSMENT/PLAN:  Abscess of right thigh - cover for MRSA, treat with Septra DS. Warm compresses/soaks. f/u 1-2 weeks for recheck, sooner if worse. Discussed how DM contributes (uncontrolled) - Plan: sulfamethoxazole-trimethoprim (BACTRIM DS) 800-160 MG tablet  Type 2 diabetes mellitus with hyperglycemia, with long-term current use of insulin  (HCC) - Poorly controlled. Likely affected by recent steroids. Based on Rx refills, can't imagine she has been compliant. f/u with PCP in 1-2 weeks, bring glu log - Plan: HgB A1c, MR Abdomen W Wo Contrast, Comprehensive metabolic panel with GFR  Mass of kidney - mass vs complex cyst.  MRI  recommended to further eval, concern for RCC.  Has a urologist, refer back if f/u needed - Plan: MR Abdomen W Wo Contrast  Abnormal MRI, kidney - Plan: MR Abdomen W Wo Contrast  Uncontrolled type 2 diabetes mellitus with hyperglycemia (HCC) - uncontrolled DM, risks/complications reviewed.  To check glu regularly, bring list to f/u. Increase insulin  to 26 U, cont metformin  - Plan: insulin  glargine, 1 Unit Dial , (TOUJEO ) 300 UNIT/ML Solostar Pen  Mixed hyperlipidemia - past due for lipids, is fasting today. Reports compliance, but rx refills wouldn't suggest that - Plan: Lipid panel  Hypertension associated with diabetes (HCC) - controlled, continue current meds  Anemia, unspecified type - unclear etiology. Denies bleeding. Due for recheck (last checked a year ago) - Plan: CBC with Differential/Platelet  Medication monitoring encounter - Plan: CBC with Differential/Platelet, Comprehensive metabolic panel with GFR, Lipid panel   Last labs reviewed--10/2023 glu 305, Cr 1.2, Na 133 WBC 10.6, Hgb 10.1  Needs renal protocol MRI with  and without contrast.  Dx mass of kidney/abnormal finding on MRI Has urologist in [place] ([facility]), to f/u there if needed based on results. Dr. Steen  Last saw PCP for CPE 02/2023, hasn't had any f/u visit since then. Saw Shane shortly after to discuss uncontrolled DM. BP meds RFd by Juliene x 6 mos in 02/2024. Has not had any f/u visits that were cancelled.  She reports seeing ophtho (and reports retinopathy)--not abstracted/records not received.

## 2024-03-19 NOTE — Patient Instructions (Addendum)
 I suspect your blood sugars may be higher than normal related to your current skin infection. Increase your Toujeo  (insulin ) to 26 units nightly. Monitor your morning blood sugars, and periodically also neck is 2 hours after dinner or at bedtime. Record all of these readings on a piece of paper and bring this paper to your next visit.  I'm hoping the sugars improve as the antibiotics work. But your diabetes wasn't well controlled last year, when there wasn't any infection. I'm sure that you will need adjustments in your medications, but I'd like to see what your labs are today, to help us  make a decision on which medication would be best.  Since you have diarrhea from the low dose of metformin  already, I wouldn't recommend increasing that.  But we do need to work on getting the diabetes under better control.  We ordered the MRI to look at your right kidney.  For the infection/abscess of the upper thigh-- Please soak this or use warm compresses at least 2x daily.  I suspect there is more to drain.   Keep the area clean and covered. Take the antibiotics as directed. Return in 1-2 weeks for re-evaluation of the wound, and to follow up on your labs and diabetes.

## 2024-03-20 ENCOUNTER — Ambulatory Visit: Payer: Self-pay | Admitting: Family Medicine

## 2024-03-20 ENCOUNTER — Encounter: Payer: Self-pay | Admitting: Family Medicine

## 2024-03-20 DIAGNOSIS — N2889 Other specified disorders of kidney and ureter: Secondary | ICD-10-CM

## 2024-03-20 LAB — COMPREHENSIVE METABOLIC PANEL WITH GFR
ALT: 10 IU/L (ref 0–32)
AST: 8 IU/L (ref 0–40)
Albumin: 3.7 g/dL — ABNORMAL LOW (ref 3.8–4.8)
Alkaline Phosphatase: 106 IU/L (ref 49–135)
BUN/Creatinine Ratio: 13 (ref 12–28)
BUN: 17 mg/dL (ref 8–27)
Bilirubin Total: 0.4 mg/dL (ref 0.0–1.2)
CO2: 20 mmol/L (ref 20–29)
Calcium: 9.3 mg/dL (ref 8.7–10.3)
Chloride: 96 mmol/L (ref 96–106)
Creatinine, Ser: 1.27 mg/dL — ABNORMAL HIGH (ref 0.57–1.00)
Globulin, Total: 2.6 g/dL (ref 1.5–4.5)
Glucose: 336 mg/dL — ABNORMAL HIGH (ref 70–99)
Potassium: 4.2 mmol/L (ref 3.5–5.2)
Sodium: 131 mmol/L — ABNORMAL LOW (ref 134–144)
Total Protein: 6.3 g/dL (ref 6.0–8.5)
eGFR: 44 mL/min/1.73 — ABNORMAL LOW (ref >59)

## 2024-03-20 LAB — CBC WITH DIFFERENTIAL/PLATELET
Basophils Absolute: 0.1 x10E3/uL (ref 0.0–0.2)
Basos: 1 %
EOS (ABSOLUTE): 0.3 x10E3/uL (ref 0.0–0.4)
Eos: 2 %
Hematocrit: 33.7 % — ABNORMAL LOW (ref 34.0–46.6)
Hemoglobin: 10 g/dL — ABNORMAL LOW (ref 11.1–15.9)
Immature Grans (Abs): 0.1 x10E3/uL (ref 0.0–0.1)
Immature Granulocytes: 1 %
Lymphocytes Absolute: 2 x10E3/uL (ref 0.7–3.1)
Lymphs: 14 %
MCH: 23.4 pg — ABNORMAL LOW (ref 26.6–33.0)
MCHC: 29.7 g/dL — ABNORMAL LOW (ref 31.5–35.7)
MCV: 79 fL (ref 79–97)
Monocytes Absolute: 1.1 x10E3/uL — ABNORMAL HIGH (ref 0.1–0.9)
Monocytes: 7 %
Neutrophils Absolute: 11.4 x10E3/uL — ABNORMAL HIGH (ref 1.4–7.0)
Neutrophils: 75 %
Platelets: 278 x10E3/uL (ref 150–450)
RBC: 4.28 x10E6/uL (ref 3.77–5.28)
RDW: 17 % — ABNORMAL HIGH (ref 11.7–15.4)
WBC: 15 x10E3/uL — ABNORMAL HIGH (ref 3.4–10.8)

## 2024-03-20 LAB — LIPID PANEL
Chol/HDL Ratio: 4.2 ratio (ref 0.0–4.4)
Cholesterol, Total: 157 mg/dL (ref 100–199)
HDL: 37 mg/dL — ABNORMAL LOW (ref >39)
LDL Chol Calc (NIH): 92 mg/dL (ref 0–99)
Triglycerides: 161 mg/dL — ABNORMAL HIGH (ref 0–149)
VLDL Cholesterol Cal: 28 mg/dL (ref 5–40)

## 2024-03-20 NOTE — Telephone Encounter (Signed)
 refilled

## 2024-03-25 LAB — IRON,TIBC AND FERRITIN PANEL
Ferritin: 61 ng/mL (ref 15–150)
Iron Saturation: 8 % — CL (ref 15–55)
Iron: 24 ug/dL — ABNORMAL LOW (ref 27–139)
Total Iron Binding Capacity: 302 ug/dL (ref 250–450)
UIBC: 278 ug/dL (ref 118–369)

## 2024-03-25 LAB — VITAMIN B12: Vitamin B-12: 512 pg/mL (ref 232–1245)

## 2024-03-25 LAB — SPECIMEN STATUS REPORT

## 2024-04-07 ENCOUNTER — Other Ambulatory Visit: Payer: Self-pay

## 2024-04-07 ENCOUNTER — Encounter: Payer: Self-pay | Admitting: Nurse Practitioner

## 2024-04-07 ENCOUNTER — Ambulatory Visit (INDEPENDENT_AMBULATORY_CARE_PROVIDER_SITE_OTHER): Admitting: Nurse Practitioner

## 2024-04-07 ENCOUNTER — Other Ambulatory Visit (HOSPITAL_COMMUNITY): Payer: Self-pay

## 2024-04-07 VITALS — BP 128/82 | HR 64 | Wt 195.6 lb

## 2024-04-07 DIAGNOSIS — L02415 Cutaneous abscess of right lower limb: Secondary | ICD-10-CM | POA: Diagnosis not present

## 2024-04-07 DIAGNOSIS — I152 Hypertension secondary to endocrine disorders: Secondary | ICD-10-CM | POA: Diagnosis not present

## 2024-04-07 DIAGNOSIS — E08319 Diabetes mellitus due to underlying condition with unspecified diabetic retinopathy without macular edema: Secondary | ICD-10-CM

## 2024-04-07 DIAGNOSIS — E1169 Type 2 diabetes mellitus with other specified complication: Secondary | ICD-10-CM | POA: Diagnosis not present

## 2024-04-07 DIAGNOSIS — R600 Localized edema: Secondary | ICD-10-CM | POA: Diagnosis not present

## 2024-04-07 DIAGNOSIS — I5032 Chronic diastolic (congestive) heart failure: Secondary | ICD-10-CM | POA: Diagnosis not present

## 2024-04-07 DIAGNOSIS — N281 Cyst of kidney, acquired: Secondary | ICD-10-CM

## 2024-04-07 DIAGNOSIS — D509 Iron deficiency anemia, unspecified: Secondary | ICD-10-CM

## 2024-04-07 DIAGNOSIS — E785 Hyperlipidemia, unspecified: Secondary | ICD-10-CM

## 2024-04-07 DIAGNOSIS — E1159 Type 2 diabetes mellitus with other circulatory complications: Secondary | ICD-10-CM | POA: Diagnosis not present

## 2024-04-07 DIAGNOSIS — E1165 Type 2 diabetes mellitus with hyperglycemia: Secondary | ICD-10-CM

## 2024-04-07 DIAGNOSIS — Z7985 Long-term (current) use of injectable non-insulin antidiabetic drugs: Secondary | ICD-10-CM

## 2024-04-07 MED ORDER — AMLODIPINE BESYLATE 10 MG PO TABS
10.0000 mg | ORAL_TABLET | Freq: Every day | ORAL | 1 refills | Status: AC
  Filled 2024-04-07 – 2024-04-17 (×3): qty 90, 90d supply, fill #0
  Filled 2024-04-17: qty 30, 30d supply, fill #0
  Filled 2024-05-16: qty 30, 30d supply, fill #1

## 2024-04-07 MED ORDER — PRAVASTATIN SODIUM 20 MG PO TABS
20.0000 mg | ORAL_TABLET | Freq: Every day | ORAL | 1 refills | Status: AC
  Filled 2024-04-07 – 2024-04-18 (×4): qty 90, 90d supply, fill #0
  Filled 2024-05-16: qty 30, 30d supply, fill #0

## 2024-04-07 MED ORDER — IRON (FERROUS SULFATE) 325 (65 FE) MG PO TABS
1.0000 | ORAL_TABLET | ORAL | 3 refills | Status: AC
  Filled 2024-04-07: qty 30, 30d supply, fill #0
  Filled 2024-04-16: qty 30, fill #0
  Filled 2024-04-17: qty 30, 70d supply, fill #0

## 2024-04-07 MED ORDER — FUROSEMIDE 40 MG PO TABS
40.0000 mg | ORAL_TABLET | Freq: Every day | ORAL | 1 refills | Status: AC
  Filled 2024-04-07: qty 90, 90d supply, fill #0

## 2024-04-07 MED ORDER — CARVEDILOL 25 MG PO TABS
25.0000 mg | ORAL_TABLET | Freq: Two times a day (BID) | ORAL | 1 refills | Status: AC
  Filled 2024-04-07 – 2024-04-18 (×4): qty 180, 90d supply, fill #0
  Filled 2024-05-16: qty 60, 30d supply, fill #0

## 2024-04-07 MED ORDER — LOSARTAN POTASSIUM-HCTZ 100-12.5 MG PO TABS
1.0000 | ORAL_TABLET | Freq: Every day | ORAL | 1 refills | Status: AC
  Filled 2024-04-07 – 2024-04-17 (×3): qty 90, 90d supply, fill #0
  Filled 2024-05-16: qty 30, 30d supply, fill #0

## 2024-04-07 MED ORDER — ASPIRIN EC 81 MG PO TBEC
81.0000 mg | DELAYED_RELEASE_TABLET | Freq: Every day | ORAL | 1 refills | Status: AC
  Filled 2024-04-07: qty 90, 90d supply, fill #0

## 2024-04-07 MED ORDER — POTASSIUM CHLORIDE CRYS ER 20 MEQ PO TBCR
20.0000 meq | EXTENDED_RELEASE_TABLET | Freq: Every day | ORAL | 1 refills | Status: AC
  Filled 2024-04-07 – 2024-04-17 (×3): qty 90, 90d supply, fill #0
  Filled 2024-04-17: qty 30, 30d supply, fill #0
  Filled 2024-05-16: qty 30, 30d supply, fill #1

## 2024-04-07 MED ORDER — EMPAGLIFLOZIN 25 MG PO TABS
25.0000 mg | ORAL_TABLET | Freq: Every day | ORAL | 1 refills | Status: AC
  Filled 2024-04-07 – 2024-04-18 (×4): qty 90, 90d supply, fill #0
  Filled 2024-05-16: qty 30, 30d supply, fill #0

## 2024-04-07 NOTE — Progress Notes (Signed)
 "  Laura Doing, DNP, AGNP-c [facility]  [address] [place], [place] [address] [phone]   ESTABLISHED PATIENT- Chronic Health and/or Follow-Up Visit on 04/07/2024  Blood pressure 128/82, pulse 64, weight 195 lb 9.6 oz (88.7 kg).   Subjective:  other (F/u on diabetes and abscess, had a cortisone shot and was told her sugar would go up, regular eye doctor told er Metformin  is not working, may want to try Jardiance  or something else, has not had mammo, had eye exam this year [place], )  History of Present Illness [name] Seema Blum is a 78 year old female who presents for follow-up of her diabetes management.  She experiences fluctuating blood sugar levels, with fasting numbers mostly in the 140s, occasionally spiking to 171. Her recent hemoglobin A1c was 13, which she attributes to a cortisone shot. She is currently on metformin  and Tresiba insulin , taking 26 units daily. She previously tried Rybelsus  but discontinued it due to severe nausea. She is interested in changing her metformin  for management recommendation by her ophthalmologist. Metformin  higher doses have caused significant GI discomfort for her.   She has diabetic retinopathy affecting her vision. She recalls that an eye doctor told her that her metformin  is not working. She is under the care of a retinal specialist , as well. She reports that her eye doctor said her eyesight could potentially improve by ninety percent.  There is an improvement in an abscessed area, which was previously large but has now cleared up significantly. She reports no further drainage or signs of active infection.   She is scheduled for a nerve block procedure and is unsure if it will affect her diabetes management.  A cyst on her kidney was identified during an MRI. The cyst has not grown since it was first noted, and she is scheduled for another MRI on January 10th for follow-up. She is eager to learn the results.    She is anemic with no known cause. No blood in her urine or stool and she is not taking any iron  supplements. She frequently feels cold and sometimes experiences chills. She also reports occasional tingling in her feet.   She lives with her boyfriend, who does not have diabetes and often brings sugary foods into the house, making dietary management challenging. Her A1c was 6.2 before moving in with him, indicating a significant increase since then.  ROS negative except for what is listed in HPI. History, Medications, Surgery, SDOH, and Family History reviewed and updated as appropriate.  Objective:  Physical Exam Vitals and nursing note reviewed.  Constitutional:      General: She is not in acute distress.    Appearance: Normal appearance. She is obese.  HENT:     Head: Normocephalic.  Eyes:     General: No scleral icterus.    Pupils: Pupils are equal, round, and reactive to light.  Neck:     Vascular: No carotid bruit.  Cardiovascular:     Rate and Rhythm: Normal rate and regular rhythm.     Pulses: Normal pulses.     Heart sounds: Normal heart sounds.  Pulmonary:     Effort: Pulmonary effort is normal.     Breath sounds: Normal breath sounds.  Abdominal:     General: Bowel sounds are normal. There is no distension.     Palpations: Abdomen is soft.     Tenderness: There is no abdominal tenderness. There is no right CVA tenderness, left CVA tenderness or guarding.  Musculoskeletal:        General: Normal range of motion.     Cervical back: Normal range of motion. No tenderness.     Right lower leg: No edema.     Left lower leg: No edema.  Lymphadenopathy:     Cervical: No cervical adenopathy.  Skin:    General: Skin is warm.     Capillary Refill: Capillary refill takes less than 2 seconds.  Neurological:     General: No focal deficit present.     Mental Status: She is alert and oriented to person, place, and time.     Sensory: No sensory deficit.     Motor: No  weakness.     Coordination: Coordination normal.  Psychiatric:        Mood and Affect: Mood normal.         Assessment & Plan:   Assessment & Plan Uncontrolled type 2 diabetes mellitus with hyperglycemia (HCC) Diabetes management is suboptimal with recent hemoglobin A1c of 13, likely exacerbated by recent cortisone injection. Current regimen includes metformin  and Tresiba insulin . Metformin  is not well tolerated due to gastrointestinal side effects and is not effectively controlling blood glucose levels. Diabetic retinopathy is present, currently being treated by an ophthalmologist. Jardiance  is considered as an alternative to metformin  due to its renal and cardiac protective benefits. We discussed this option and she is in agreement to trial this. It appears that this is covered by her insurance, but cost is a factor to consider. Samples provided today to get her started. We discussed monitoring for candida infections, of which she has no personal history. Will plan to stop metformin  after 2 weeks on Jardiance . Have also sent a referral to pharmacy to see if she qualifies for any patient assistance programs for Jardiance  or Tresiba.  - Initiated Jardiance  once daily for diabetes management and renal protection. - Continue metformin  and Tresiba insulin  for two weeks, then discontinue metformin  if Jardiance  is well tolerated. - Coordinated with pharmacist for patient assistance program to obtain Jardiance . - Continue follow-up with ophthalmologist for diabetic retinopathy management. Orders:   empagliflozin  (JARDIANCE ) 25 MG TABS tablet; Take 1 tablet (25 mg total) by mouth daily. For diabetes and kidney protection.   AMB Referral [facility] Care Management   CBC with Differential/Platelet   Comprehensive metabolic panel with GFR   Iron , TIBC and Ferritin Panel   Microalbumin/Creatinine Ratio, Urine  Hyperlipidemia associated with type 2 diabetes mellitus (HCC) Associated with diabetes and  hypertension. Currently managed with pravastatin . Will plan to continue current management.  Orders:   AMB Referral [facility] Care Management   pravastatin  (PRAVACHOL ) 20 MG tablet; Take 1 tablet (20 mg total) by mouth daily. For cholesterol and diabetes.  Hypertension associated with diabetes (HCC) Associated with diabetes and hyperlipidemia. Currently managed with amlodipine , losartan -hydrochlorothiazide, and carvedilol . Blood pressures are well controlled. No changes.  Orders:   amLODipine  (NORVASC ) 10 MG tablet; Take 1 tablet (10 mg total) by mouth daily. For blood pressure.   AMB Referral [facility]BCI Care Management   carvedilol  (COREG ) 25 MG tablet; Take 1 tablet (25 mg total) by mouth 2 (two) times daily. For blood pressure and heart rate.   losartan -hydrochlorothiazide (HYZAAR) 100-12.5 MG tablet; Take 1 tablet by mouth daily. For blood pressure.   aspirin EC 81 MG tablet; Take 1 tablet (81 mg total) by mouth daily.  Chronic diastolic heart failure [facility]) Previously discharged from cardiology. Will refill cardiovascular medication today for ongoing management. No alarm symptoms present at this  time. If new CV symptoms present, will refer back to cardiology for further management.  Orders:   amLODipine  (NORVASC ) 10 MG tablet; Take 1 tablet (10 mg total) by mouth daily. For blood pressure.   potassium chloride  SA (KLOR-CON  M) 20 MEQ tablet; Take 1 tablet (20 mEq total) by mouth daily. For low potassium.   empagliflozin  (JARDIANCE ) 25 MG TABS tablet; Take 1 tablet (25 mg total) by mouth daily. For diabetes and kidney protection.   AMB Referral [facility]BCI Care Management   furosemide  (LASIX ) 40 MG tablet; Take 1 tablet (40 mg total) by mouth daily. For swelling   carvedilol  (COREG ) 25 MG tablet; Take 1 tablet (25 mg total) by mouth 2 (two) times daily. For blood pressure and heart rate.   pravastatin  (PRAVACHOL ) 20 MG tablet; Take 1 tablet (20 mg total) by mouth daily. For cholesterol and diabetes.    losartan -hydrochlorothiazide (HYZAAR) 100-12.5 MG tablet; Take 1 tablet by mouth daily. For blood pressure.   aspirin EC 81 MG tablet; Take 1 tablet (81 mg total) by mouth daily.  Diabetic retinopathy of both eyes associated with diabetes mellitus due to underlying condition, macular edema presence unspecified, unspecified retinopathy severity (HCC) Following with ophthalmology and retinal specialist. No alarm symptoms present today. Tight glucose control recommended for ongoing management.  Orders:   AMB Referral [facility]BCI Care Management  Abscess of right thigh Well healed at this time with no active signs of infection. Recommend close monitoring for any new or worsening symptoms and report immediately. Cautiously starting Jardiance  to help better control blood sugar levels.     Bilateral lower extremity edema Well controlled with furosemide  daily. No alarm symptoms.  Orders:   potassium chloride  SA (KLOR-CON  M) 20 MEQ tablet; Take 1 tablet (20 mEq total) by mouth daily. For low potassium.   furosemide  (LASIX ) 40 MG tablet; Take 1 tablet (40 mg total) by mouth daily. For swelling  Iron  deficiency anemia, unspecified iron  deficiency anemia type Present with low iron  levels. No evidence of gastrointestinal bleeding or hematuria. Anemia may contribute to feeling cold and experiencing chills. - Initiated ferrous sulfate  supplementation three times a week. - Will monitor for gastrointestinal side effects and adjust dosage as needed.  Orders:   Iron , Ferrous Sulfate , 325 (65 Fe) MG TABS; Take 1 tablet by mouth 3 days a week. For iron  deficiency.  Renal cyst Identified on previous MRI, with no growth noted. Scheduled for follow-up MRI on January 10th to reassess the cyst. - Proceed with scheduled MRI on January 10th to evaluate renal cyst. - Will consider referral to urologist if MRI indicates further evaluation is needed.      Lue Sykora E Shad Ledvina, DNP, AGNP-c  55 minutes spent on care provided  today. Time includes care provided during the visit (>50% total time), care coordination, chart review, and documentation.   "

## 2024-04-07 NOTE — Assessment & Plan Note (Addendum)
 Diabetes management is suboptimal with recent hemoglobin A1c of 13, likely exacerbated by recent cortisone injection. Current regimen includes metformin  and Tresiba insulin . Metformin  is not well tolerated due to gastrointestinal side effects and is not effectively controlling blood glucose levels. Diabetic retinopathy is present, currently being treated by an ophthalmologist. Jardiance  is considered as an alternative to metformin  due to its renal and cardiac protective benefits. We discussed this option and she is in agreement to trial this. It appears that this is covered by her insurance, but cost is a factor to consider. Samples provided today to get her started. We discussed monitoring for candida infections, of which she has no personal history. Will plan to stop metformin  after 2 weeks on Jardiance . Have also sent a referral to pharmacy to see if she qualifies for any patient assistance programs for Jardiance  or Tresiba.  - Initiated Jardiance  once daily for diabetes management and renal protection. - Continue metformin  and Tresiba insulin  for two weeks, then discontinue metformin  if Jardiance  is well tolerated. - Coordinated with pharmacist for patient assistance program to obtain Jardiance . - Continue follow-up with ophthalmologist for diabetic retinopathy management. Orders:   empagliflozin  (JARDIANCE ) 25 MG TABS tablet; Take 1 tablet (25 mg total) by mouth daily. For diabetes and kidney protection.   AMB Referral [facility]BC[facility] Care Management   CBC with Differential/Platelet   Comprehensive metabolic panel with GFR   Iron , TIBC and Ferritin Panel   Microalbumin/Creatinine Ratio, Urine

## 2024-04-07 NOTE — Patient Instructions (Addendum)
 I sent all of your medication to the [facility]. They will mail this in daily pill packs to help make it easier to keep track of. They will contact you to set up sending the medication.   Plan: Start the Jardiance  25mg  once a day. I have contacted our pharmacy team to see if we can get patient assistance for this. One of the pharmacists will contact you (usually our pharmacist is Jon).   After 2 weeks on the Jardiance  we can stop the Metformin .  We can adjust the insulin  if we need to, but right for now, let's see how adding Jardiance  does.   I sent in Iron  (ferrous sulfate ) for you to take 3 days a week. This can be taken daily, if you don't have any side effects.    Diabetes Action Plan A diabetes action plan is a way for you to manage your symptoms of diabetes, also called diabetes mellitus. The plan is color-coded to guide you on what actions to take based on any symptoms you're having. If you have symptoms in the red zone, you need medical care right away. If you have symptoms in the yellow zone, your diabetes isn't under control, and you may need to make some changes. If you have symptoms in the green zone, you're doing well. Understanding diabetes can take time. Follow the treatment plan that you created with your health care provider. Know the target range for your blood sugar, also called glucose. Review your plan each time you visit your provider. The target range for my blood sugar level is 120-140 FASTING  mg/dL. Red zone Get medical help right away if you have any of the following symptoms: A blood sugar test result that's below 54 mg/dL (3 mmol/L). A blood sugar test result that's at or above 240 mg/dL (86.6 mmol/L) for 2 days in a row along with: Extreme thirst and frequent peeing. Confusion or trouble thinking clearly. Moderate or large ketone levels in your pee (urine). Feeling tired or having no energy. Trouble breathing. Sickness or a fever for 2 or more  days that's not getting better. These symptoms may be an emergency. Call 911 right away. Do not wait to see if the symptoms will go away. Do not drive yourself to the hospital. If you have very low blood sugar, also called severe hypoglycemia, and you can't eat or drink, you may need glucagon. Make sure a family member or close friend knows how to check your blood sugar and how to give you glucagon. You may need to be treated in a hospital for this condition. Yellow zone If you have any of the following symptoms, your diabetes isn't under control, and you may need to make some changes: A blood sugar test result that's at or above 240 mg/dL (86.6 mmol/L) for 2 days in a row. Blood sugar test results that are below 70 mg/dL (3.9 mmol/L). Other symptoms of hypoglycemia, such as: Shaking or feeling light-headed. Confusion or irritability. Feeling hungry. Having a fast heartbeat. If you have any yellow zone symptoms: Treat your hypoglycemia by eating or drinking 15 grams of a rapid-acting carbohydrate. Follow the 15:15 rule: Take 15 grams of a rapid-acting carbohydrate, such as: 1 tube of glucose gel. 4 glucose pills. 4 oz (120 mL) of fruit juice. 4 oz (120 mL) of regular (not diet) soda. Check your blood sugar again 15 minutes after you take the carbohydrate. If the second blood sugar test is still at or below 70 mg/dL (  3.9 mmol/L), take 15 grams of a carbohydrate again. If your blood sugar doesn't increase above 70 mg/dL (3.9 mmol/L) after 3 tries, get medical help right away. After your blood sugar returns to normal, eat a meal or a snack within 1 hour. Keep taking your daily medicines as told by your provider. Check your blood sugar more often than you normally would. Write down your results. Call your provider if you have trouble keeping your blood sugar in your target range. Green zone These signs mean you're doing well and can continue what you're doing to manage your diabetes: Your  blood sugar is within your personal target range. For most people, a blood sugar level before a meal should be 80-130 mg/dL (4.4-7.2 mmol/L). You feel well, and you're able to do daily activities. If you're in the green zone, continue to manage your diabetes as told by your provider. To do this: Eat a healthy diet. Exercise regularly. Check your blood sugar as told. Take your medicines only as told. Where to find more information American Diabetes Association (ADA): diabetes.org Association of Diabetes Care & Education Specialists (ADCES): adces.org/diabetes-education-dsmes This information is not intended to replace advice given to you by your health care provider. Make sure you discuss any questions you have with your health care provider. Document Revised: 11/15/2022 Document Reviewed: 11/15/2022 Elsevier Patient Education  2024 [employer].

## 2024-04-07 NOTE — Assessment & Plan Note (Addendum)
 Associated with diabetes and hypertension. Currently managed with pravastatin . Will plan to continue current management.  Orders:   AMB Referral VBCI Care Management   pravastatin  (PRAVACHOL ) 20 MG tablet; Take 1 tablet (20 mg total) by mouth daily. For cholesterol and diabetes.

## 2024-04-07 NOTE — Assessment & Plan Note (Addendum)
 Associated with diabetes and hyperlipidemia. Currently managed with amlodipine , losartan -hydrochlorothiazide, and carvedilol . Blood pressures are well controlled. No changes.  Orders:   amLODipine  (NORVASC ) 10 MG tablet; Take 1 tablet (10 mg total) by mouth daily. For blood pressure.   AMB Referral VBCI Care Management   carvedilol  (COREG ) 25 MG tablet; Take 1 tablet (25 mg total) by mouth 2 (two) times daily. For blood pressure and heart rate.   losartan -hydrochlorothiazide (HYZAAR) 100-12.5 MG tablet; Take 1 tablet by mouth daily. For blood pressure.   aspirin EC 81 MG tablet; Take 1 tablet (81 mg total) by mouth daily.

## 2024-04-07 NOTE — Assessment & Plan Note (Signed)
 Previously discharged from cardiology. Will refill cardiovascular medication today for ongoing management. No alarm symptoms present at this time. If new CV symptoms present, will refer back to cardiology for further management.  Orders:   amLODipine  (NORVASC ) 10 MG tablet; Take 1 tablet (10 mg total) by mouth daily. For blood pressure.   potassium chloride  SA (KLOR-CON  M) 20 MEQ tablet; Take 1 tablet (20 mEq total) by mouth daily. For low potassium.   empagliflozin  (JARDIANCE ) 25 MG TABS tablet; Take 1 tablet (25 mg total) by mouth daily. For diabetes and kidney protection.   AMB Referral VBCI Care Management   furosemide  (LASIX ) 40 MG tablet; Take 1 tablet (40 mg total) by mouth daily. For swelling   carvedilol  (COREG ) 25 MG tablet; Take 1 tablet (25 mg total) by mouth 2 (two) times daily. For blood pressure and heart rate.   pravastatin  (PRAVACHOL ) 20 MG tablet; Take 1 tablet (20 mg total) by mouth daily. For cholesterol and diabetes.   losartan -hydrochlorothiazide (HYZAAR) 100-12.5 MG tablet; Take 1 tablet by mouth daily. For blood pressure.   aspirin EC 81 MG tablet; Take 1 tablet (81 mg total) by mouth daily.

## 2024-04-08 ENCOUNTER — Other Ambulatory Visit (HOSPITAL_COMMUNITY): Payer: Self-pay

## 2024-04-08 LAB — COMPREHENSIVE METABOLIC PANEL WITH GFR
ALT: 12 IU/L (ref 0–32)
AST: 13 IU/L (ref 0–40)
Albumin: 4.1 g/dL (ref 3.8–4.8)
Alkaline Phosphatase: 92 IU/L (ref 49–135)
BUN/Creatinine Ratio: 23 (ref 12–28)
BUN: 28 mg/dL — ABNORMAL HIGH (ref 8–27)
Bilirubin Total: 0.4 mg/dL (ref 0.0–1.2)
CO2: 22 mmol/L (ref 20–29)
Calcium: 9.5 mg/dL (ref 8.7–10.3)
Chloride: 99 mmol/L (ref 96–106)
Creatinine, Ser: 1.24 mg/dL — ABNORMAL HIGH (ref 0.57–1.00)
Globulin, Total: 2.7 g/dL (ref 1.5–4.5)
Glucose: 182 mg/dL — ABNORMAL HIGH (ref 70–99)
Potassium: 4.2 mmol/L (ref 3.5–5.2)
Sodium: 137 mmol/L (ref 134–144)
Total Protein: 6.8 g/dL (ref 6.0–8.5)
eGFR: 45 mL/min/1.73 — ABNORMAL LOW

## 2024-04-08 LAB — CBC WITH DIFFERENTIAL/PLATELET
Basophils Absolute: 0.2 x10E3/uL (ref 0.0–0.2)
Basos: 2 %
EOS (ABSOLUTE): 0.4 x10E3/uL (ref 0.0–0.4)
Eos: 4 %
Hematocrit: 37 % (ref 34.0–46.6)
Hemoglobin: 10.9 g/dL — ABNORMAL LOW (ref 11.1–15.9)
Immature Grans (Abs): 0 x10E3/uL (ref 0.0–0.1)
Immature Granulocytes: 0 %
Lymphocytes Absolute: 2.4 x10E3/uL (ref 0.7–3.1)
Lymphs: 23 %
MCH: 23.5 pg — ABNORMAL LOW (ref 26.6–33.0)
MCHC: 29.5 g/dL — ABNORMAL LOW (ref 31.5–35.7)
MCV: 80 fL (ref 79–97)
Monocytes Absolute: 0.7 x10E3/uL (ref 0.1–0.9)
Monocytes: 7 %
Neutrophils Absolute: 6.6 x10E3/uL (ref 1.4–7.0)
Neutrophils: 64 %
Platelets: 392 x10E3/uL (ref 150–450)
RBC: 4.64 x10E6/uL (ref 3.77–5.28)
RDW: 18.6 % — ABNORMAL HIGH (ref 11.7–15.4)
WBC: 10.3 x10E3/uL (ref 3.4–10.8)

## 2024-04-08 LAB — IRON,TIBC AND FERRITIN PANEL
Ferritin: 18 ng/mL (ref 15–150)
Iron Saturation: 6 % — CL (ref 15–55)
Iron: 24 ug/dL — ABNORMAL LOW (ref 27–139)
Total Iron Binding Capacity: 374 ug/dL (ref 250–450)
UIBC: 350 ug/dL (ref 118–369)

## 2024-04-09 ENCOUNTER — Ambulatory Visit: Payer: Self-pay | Admitting: Nurse Practitioner

## 2024-04-15 ENCOUNTER — Other Ambulatory Visit (HOSPITAL_COMMUNITY): Payer: Self-pay

## 2024-04-16 ENCOUNTER — Ambulatory Visit: Payer: Self-pay | Admitting: Pharmacist

## 2024-04-16 ENCOUNTER — Other Ambulatory Visit: Payer: Self-pay

## 2024-04-17 ENCOUNTER — Ambulatory Visit: Payer: Self-pay | Admitting: Pharmacist

## 2024-04-17 ENCOUNTER — Other Ambulatory Visit (HOSPITAL_COMMUNITY): Payer: Self-pay

## 2024-04-17 ENCOUNTER — Telehealth: Payer: Self-pay

## 2024-04-17 ENCOUNTER — Other Ambulatory Visit: Payer: Self-pay

## 2024-04-17 NOTE — Telephone Encounter (Signed)
 Copied from CRM #[id number]. Topic: Clinical - Prescription Issue >> Apr 17, 2024  1:24 PM Gattis SQUIBB wrote: Reason for CRM: pt called to say the Jardiance  is going to be too expensive for her.  She wants to know if there is something else she can take.  [phone]

## 2024-04-18 ENCOUNTER — Other Ambulatory Visit: Payer: Self-pay

## 2024-04-18 NOTE — Telephone Encounter (Signed)
 Spoke with patient. Should qualify for Jardiance  PAP through [facility]. Coordinating application paperwork for patient to come in sign at office. Thank you!

## 2024-04-19 ENCOUNTER — Ambulatory Visit
Admission: RE | Admit: 2024-04-19 | Discharge: 2024-04-19 | Disposition: A | Source: Ambulatory Visit | Attending: Family Medicine | Admitting: Family Medicine

## 2024-04-19 DIAGNOSIS — E1165 Type 2 diabetes mellitus with hyperglycemia: Secondary | ICD-10-CM

## 2024-04-19 DIAGNOSIS — R93429 Abnormal radiologic findings on diagnostic imaging of unspecified kidney: Secondary | ICD-10-CM

## 2024-04-19 DIAGNOSIS — N2889 Other specified disorders of kidney and ureter: Secondary | ICD-10-CM

## 2024-04-19 MED ORDER — GADOPICLENOL 0.5 MMOL/ML IV SOLN
9.0000 mL | Freq: Once | INTRAVENOUS | Status: AC | PRN
  Administered 2024-04-19: 9 mL via INTRAVENOUS

## 2024-04-21 ENCOUNTER — Telehealth: Payer: Self-pay

## 2024-04-21 NOTE — Progress Notes (Signed)
 Care Guide Pharmacy Note  04/21/2024 Name: Wilhemina Grall MRN: [id number] DOB: [birth year]  Referred By: Oris Camie BRAVO, NP Reason for referral: No chief complaint on file.   [name] is a 79 y.o. year old female who is a primary care patient of Early, Sara E, NP.  Ronal Dene Radish was referred to the pharmacist for assistance related to: HTN, HLD, and DMII  Successful contact was made with the patient to discuss pharmacy services including being ready for the pharmacist to call at least 5 minutes before the scheduled appointment time and to have medication bottles and any blood pressure readings ready for review. The patient agreed to meet with the pharmacist via telephone visit on (date/time). 05/08/24  Debbe Vivian [facility]  [facility], [facility] Guide  Direct Dial : [phone]  Fax [phone]

## 2024-04-23 ENCOUNTER — Encounter: Payer: Self-pay | Admitting: Family Medicine

## 2024-04-23 ENCOUNTER — Ambulatory Visit: Admitting: Family Medicine

## 2024-04-23 VITALS — BP 132/94 | HR 73 | Wt 194.0 lb

## 2024-04-23 DIAGNOSIS — R112 Nausea with vomiting, unspecified: Secondary | ICD-10-CM | POA: Diagnosis not present

## 2024-04-23 DIAGNOSIS — I10 Essential (primary) hypertension: Secondary | ICD-10-CM

## 2024-04-23 LAB — COMPREHENSIVE METABOLIC PANEL WITH GFR
ALT: 10 IU/L (ref 0–32)
AST: 16 IU/L (ref 0–40)
Albumin: 4.1 g/dL (ref 3.8–4.8)
Alkaline Phosphatase: 127 IU/L (ref 49–135)
BUN/Creatinine Ratio: 18 (ref 12–28)
BUN: 26 mg/dL (ref 8–27)
Bilirubin Total: 0.3 mg/dL (ref 0.0–1.2)
CO2: 18 mmol/L — ABNORMAL LOW (ref 20–29)
Calcium: 9.1 mg/dL (ref 8.7–10.3)
Chloride: 98 mmol/L (ref 96–106)
Creatinine, Ser: 1.42 mg/dL — ABNORMAL HIGH (ref 0.57–1.00)
Globulin, Total: 2.9 g/dL (ref 1.5–4.5)
Glucose: 242 mg/dL — ABNORMAL HIGH (ref 70–99)
Potassium: 4.5 mmol/L (ref 3.5–5.2)
Sodium: 134 mmol/L (ref 134–144)
Total Protein: 7 g/dL (ref 6.0–8.5)
eGFR: 38 mL/min/1.73 — ABNORMAL LOW

## 2024-04-23 MED ORDER — ONDANSETRON 4 MG PO TBDP: 4.0000 mg | ORAL_TABLET | Freq: Three times a day (TID) | ORAL | 0 refills | Status: AC | PRN

## 2024-04-23 NOTE — Progress Notes (Signed)
 "  Name: [name]   Date of Visit: 04/23/2024   Date of last visit with me: Visit date not found   CHIEF COMPLAINT:  Chief Complaint  Patient presents with   Acute Visit    Vomiting, possible reaction to jardiance , been on medication for a week. Yesterday every time she ate something she threw up. No fever. Upset stomach.        HPI:  Discussed the use of AI scribe software for clinical note transcription with the patient, who gave verbal consent to proceed.  History of Present Illness   [name] is a 79 year old female who presents with persistent nausea and vomiting.  She has been experiencing persistent nausea and vomiting since yesterday, unable to retain any food. She vomits every time she attempts to eat.  She has been taking Jardiance  for two weeks and initially suspected it might be causing her symptoms. She is also on metformin , which she plans to discontinue this week, and takes meloxicam daily for a pinched nerve. She recently received a nerve block and is also on gabapentin .  She expresses concern about the number of medications affecting her kidneys, especially since she has a cyst on her kidney, for which she recently had an MRI.  She drinks about ten glasses of water a day to stay hydrated and has been avoiding public places to prevent catching any viruses. She has not been around anyone who is sick.  She has not eaten anything today due to a previous doctor's appointment and to avoid vomiting. She is diabetic and typically avoids sugary drinks but is open to consuming Gatorade temporarily to maintain her electrolyte balance while she is unable to eat.         OBJECTIVE:       04/07/2024   10:15 AM  Depression screen PHQ 2/9  Decreased Interest 0  Down, Depressed, Hopeless 0  PHQ - 2 Score 0     BP Readings from Last 3 Encounters:  04/23/24 (!) 132/94  04/07/24 128/82  03/19/24 130/70    BP (!) 132/94   Pulse 73   Wt 194 lb  (88 kg)   SpO2 98%   BMI 33.30 kg/m    Physical Exam          Physical Exam Constitutional:      Appearance: Normal appearance.  Neurological:     General: No focal deficit present.     Mental Status: She is alert and oriented to person, place, and time. Mental status is at baseline.     ASSESSMENT/PLAN:   Assessment & Plan Nausea and vomiting, unspecified vomiting type    Assessment and Plan    Nausea and vomiting Acute onset likely due to medication side effects or viral illness. Dehydration risk due to fluid loss. - Prescribed antiemetic every 6-8 hours as needed. - Encouraged hydration with water, Pedialyte, or Gatorade. - Ordered blood work for metabolic assessment. - Follow-up call tomorrow to review labs and adjust treatment.  Type 2 diabetes mellitus Nausea and vomiting may impact blood glucose control. - Allow Gatorade consumption to maintain caloric intake and prevent hypoglycemia.  Hypertension Elevated blood pressure possibly due to acute illness or stress. - Discuss necessity of current antihypertensive medications with Lauraine Hun.  Chronic pain due to nerve root disorder Managed with gabapentin  and meloxicam. Recent nerve block performed. - Discuss necessity of current pain medications with Lauraine Hun.  Renal cyst Concerns about kidney function due  to multiple medications. - Discuss necessity of current medications affecting kidney function with Lauraine Hun.         Sharlyn Odonnel A. Vita MD [facility] "

## 2024-04-24 ENCOUNTER — Ambulatory Visit: Payer: Self-pay | Admitting: Family Medicine

## 2024-04-24 ENCOUNTER — Telehealth: Payer: Self-pay

## 2024-04-24 NOTE — Progress Notes (Signed)
" ° °  04/24/2024  Patient ID: [name], female   DOB: [birth year], 79 y.o.   MRN: [id number]  Submitted completed application for Jardiance  assistance to [facility], pending company decision.  Jon VEAR Lindau, PharmD Clinical Pharmacist [phone]  "

## 2024-04-28 NOTE — Telephone Encounter (Signed)
 Received approval letter from [employer] (Jardiance ) thru 04/09/2025,approval letter index.

## 2024-05-01 ENCOUNTER — Ambulatory Visit: Admitting: Family Medicine

## 2024-05-08 ENCOUNTER — Other Ambulatory Visit (HOSPITAL_COMMUNITY): Payer: Self-pay

## 2024-05-08 ENCOUNTER — Other Ambulatory Visit: Payer: Self-pay

## 2024-05-08 ENCOUNTER — Other Ambulatory Visit (INDEPENDENT_AMBULATORY_CARE_PROVIDER_SITE_OTHER)

## 2024-05-08 DIAGNOSIS — E1165 Type 2 diabetes mellitus with hyperglycemia: Secondary | ICD-10-CM

## 2024-05-08 DIAGNOSIS — I10 Essential (primary) hypertension: Secondary | ICD-10-CM

## 2024-05-08 MED ORDER — FREESTYLE LIBRE 3 PLUS SENSOR MISC
2 refills | Status: AC
  Filled 2024-05-08: qty 2, 30d supply, fill #0

## 2024-05-08 NOTE — Progress Notes (Signed)
 "  05/08/2024 Name: [name] MRN: [id number] DOB: 21-Mar-1946  Chief Complaint  Patient presents with   Medication Management   Diabetes    [name] is a 79 y.o. year old female who presented for a telephone visit.   They were referred to the pharmacist by their PCP for assistance in managing diabetes, hypertension, and medication access.    Subjective:  Care Team: Primary Care Provider: Oris Camie BRAVO, NP ; Next Scheduled Visit: 07/08/24  Medication Access/Adherence  Current Pharmacy:  [id number] [place] [place] - [address] [place] [address] Phone: [phone] Fax: [phone]  [facility] [address],  - [address] [place] [address] Phone: [phone] Fax: [phone]  [facility] [address] [place] [address] Phone: [phone] Fax: [phone]   Patient reports affordability concerns with their medications: No  Patient reports access/transportation concerns to their pharmacy: No  Patient reports adherence concerns with their medications:  No     Diabetes:  Current medications: Tresiba 26 units daily, Jardiance  25mg  daily Medications tried in the past: Metformin , Glipizide , Ozempic  Current glucose readings: 141 fasting this morning, checks 2-3x/day  Observed patterns:  Patient denies hypoglycemic s/sx including dizziness, shakiness, sweating. Patient denies hyperglycemic symptoms including polyuria, polydipsia, polyphagia, nocturia, neuropathy, blurred vision.   Current medication access support: Jardiance  through [facility]  Notes that upset stomach/vomiting has completely resolved since stopping metformin   Macrovascular and Microvascular Risk Reduction:  Statin? yes (pravastatin ); ACEi/ARB? yes (losartan benson) Last urinary albumin/creatinine ratio:  Lab Results  Component Value Date   MICRALBCREAT <10 02/09/2023    MICRALBCREAT 26.7 05/09/2017   MICRALBCREAT 27 02/11/2016   Last eye exam:  Lab Results  Component Value Date   HMDIABEYEEXA Retinopathy (A) 03/02/2018   Last foot exam: 04/07/2024 Tobacco Use:  Tobacco Use: Low Risk (04/23/2024)   Patient History    Smoking Tobacco Use: Never    Smokeless Tobacco Use: Never    Passive Exposure: Not on file   Hypertension:  Current medications: Losartan -hydrochlorothiazide , Carvedilol , Amlodipine , Lasix  twice weekly prn swelling Medications previously tried: Metoprolol   Patient has a validated, automated, upper arm home BP cuff Current blood pressure readings readings: has not been checking  Patient denies hypotensive s/sx including dizziness, lightheadedness.  Patient denies hypertensive symptoms including headache, chest pain, shortness of breath    Objective:  Lab Results  Component Value Date   HGBA1C 13.0 (A) 03/19/2024    Lab Results  Component Value Date   CREATININE 1.42 (H) 04/23/2024   BUN 26 04/23/2024   NA 134 04/23/2024   K 4.5 04/23/2024   CL 98 04/23/2024   CO2 18 (L) 04/23/2024    Lab Results  Component Value Date   CHOL 157 03/19/2024   HDL 37 (L) 03/19/2024   LDLCALC 92 03/19/2024   LDLDIRECT 172.1 10/16/2006   TRIG 161 (H) 03/19/2024   CHOLHDL 4.2 03/19/2024    Medications Reviewed Today     Reviewed by Lionell Jon DEL, RPH (Pharmacist) on 05/08/24 at 1157  Med List Status: <None>   Medication Order Taking? Sig Documenting Provider Last Dose Status Informant  Accu-Chek Softclix Lancets lancets [id number]  1 each by Other route 2 (two) times daily. Used to check blood sugars twice daily. DX code E11.9. [provider]  Active Self  amLODipine  (NORVASC ) 10 MG tablet [id number] Yes Take 1 tablet (10 mg total) by mouth daily. For  blood pressure. Early, Sara E, NP  Active   aspirin  EC 81 MG tablet [id number] Yes Take 1 tablet (81 mg total) by mouth daily. Early, Sara E, NP  Active   BD PEN NEEDLE  NANO 2ND GEN 32G X 4 MM MISC [id number]  USE ONCE DAILY AT BEDTIME Early, Sara E, NP  Active   Blood Glucose Monitoring Suppl (ACCU-CHEK AVIVA PLUS) w/Device KIT [id number]  USE AS DIRECTED 2 TIMES DAILY Henson, [name] L, NP-C  Active Self  carvedilol  (COREG ) 25 MG tablet [id number] Yes Take 1 tablet (25 mg total) by mouth 2 (two) times daily. For blood pressure and heart rate. Early, Sara E, NP  Active   dexamethasone  (DECADRON ) 0.1 % ophthalmic solution [id number]  Place 1 drop into the right eye 2 (two) times daily. [provider]  Active   empagliflozin  (JARDIANCE ) 25 MG TABS tablet [id number] Yes Take 1 tablet (25 mg total) by mouth daily. For diabetes and kidney protection. Early, Sara E, NP  Active   furosemide  (LASIX ) 40 MG tablet [id number]  Take 1 tablet (40 mg total) by mouth daily. For swelling Early, Sara E, NP  Active   gabapentin  (NEURONTIN ) 300 MG capsule [id number] Yes Take 300 mg by mouth 3 (three) times daily. [provider]  Active Self  glucose blood test strip [id number]  Used to check blood sugars twice daily. DX code E11.9. Kassie Mallick, MD  Active Self  insulin  [facility]ec [facility]) 100 UNIT/ML FlexTouch Pen [id number] Yes Inject 26 Units into the skin daily. Randol Dawes, MD  Active   Iron , Ferrous Sulfate , 325 (65 Fe) MG TABS [id number] Yes Take 1 tablet by mouth 3 (three) times a week for iron  deficiency. Early, Sara E, NP  Active   ketorolac  (ACULAR ) 0.5 % ophthalmic solution [id number]  Place 1 drop into the right eye 2 (two) times daily. [provider]  Active   losartan -hydrochlorothiazide  (HYZAAR ) 100-12.5 MG tablet [id number] Yes Take 1 tablet by mouth daily. For blood pressure. Early, Sara E, NP  Active   meloxicam (MOBIC) 15 MG tablet [id number] Yes Take 15 mg by mouth daily. [provider]  Active Self  ondansetron  (ZOFRAN -ODT) 4 MG disintegrating tablet [id number]  Take 1 tablet (4 mg total) by mouth every 8 (eight) hours as needed  for nausea or vomiting.  Patient not taking: Reported on 05/08/2024   Jha, Panav, MD  Active   potassium chloride  SA (KLOR-CON  M) 20 MEQ tablet [id number] Yes Take 1 tablet (20 mEq total) by mouth daily. For low potassium. Early, Sara E, NP  Active   pravastatin  (PRAVACHOL ) 20 MG tablet [id number] Yes Take 1 tablet (20 mg total) by mouth daily. For cholesterol and diabetes. Oris Camie BRAVO, NP  Active               Assessment/Plan:   Diabetes: - Currently uncontrolled; goal A1c <7%. Cardiorenal risk reduction has opportunities for improvement.. Blood pressure is not at goal <130/80. LDL is not at goal.  - Reviewed long term cardiovascular and renal outcomes of uncontrolled blood sugar. and Reviewed dietary modifications including low carb diet and hydration. - Recommend to continue current med therapy. - Discussed potential side effects of dehydration, genitourinary infections., Advised on sick day rules (if a day with significantly reduced oral intake, serious vomiting, or diarrhea, hold SGLT2) -START Clarinda libre 3 CGM, patient plans to use smartphone in place of reader device -Consider adjutment in statin therapy if LDL continues to be >70 at next  check  Hypertension: - Currently uncontrolled - Reviewed long term cardiovascular and renal outcomes of uncontrolled blood pressure - Reviewed appropriate blood pressure monitoring technique and reviewed goal blood pressure. Recommended to check home blood pressure and heart rate at least once weekly.(Normally within goal, last in office reading was from a sick visit) - Recommend to continue current med therapy for now    Follow Up Plan: 2 weeks  Jon VEAR Lindau, PharmD Clinical Pharmacist [phone]  "

## 2024-05-16 ENCOUNTER — Other Ambulatory Visit (HOSPITAL_COMMUNITY): Payer: Self-pay

## 2024-05-16 ENCOUNTER — Other Ambulatory Visit: Payer: Self-pay

## 2024-05-22 ENCOUNTER — Other Ambulatory Visit

## 2024-07-08 ENCOUNTER — Ambulatory Visit: Admitting: Nurse Practitioner
